# Patient Record
Sex: Female | Born: 1945 | Race: White | Hispanic: No | Marital: Married | Smoking: Never smoker
Health system: Southern US, Community
[De-identification: ages and names within clinical notes are randomized; demographics above are authoritative.]

## PROBLEM LIST (undated history)

## (undated) DIAGNOSIS — K7689 Other specified diseases of liver: Secondary | ICD-10-CM

## (undated) DIAGNOSIS — I872 Venous insufficiency (chronic) (peripheral): Secondary | ICD-10-CM

## (undated) DIAGNOSIS — Z860100 Personal history of colon polyps, unspecified: Secondary | ICD-10-CM

## (undated) DIAGNOSIS — E039 Hypothyroidism, unspecified: Secondary | ICD-10-CM

## (undated) DIAGNOSIS — I10 Essential (primary) hypertension: Secondary | ICD-10-CM

## (undated) DIAGNOSIS — M1990 Unspecified osteoarthritis, unspecified site: Secondary | ICD-10-CM

## (undated) DIAGNOSIS — M5030 Other cervical disc degeneration, unspecified cervical region: Secondary | ICD-10-CM

## (undated) DIAGNOSIS — N3941 Urge incontinence: Secondary | ICD-10-CM

## (undated) DIAGNOSIS — M51369 Other intervertebral disc degeneration, lumbar region without mention of lumbar back pain or lower extremity pain: Secondary | ICD-10-CM

## (undated) DIAGNOSIS — C44729 Squamous cell carcinoma of skin of left lower limb, including hip: Secondary | ICD-10-CM

## (undated) DIAGNOSIS — J189 Pneumonia, unspecified organism: Secondary | ICD-10-CM

## (undated) DIAGNOSIS — J309 Allergic rhinitis, unspecified: Secondary | ICD-10-CM

## (undated) DIAGNOSIS — M5136 Other intervertebral disc degeneration, lumbar region: Secondary | ICD-10-CM

## (undated) DIAGNOSIS — E669 Obesity, unspecified: Secondary | ICD-10-CM

## (undated) DIAGNOSIS — F419 Anxiety disorder, unspecified: Secondary | ICD-10-CM

## (undated) DIAGNOSIS — G2581 Restless legs syndrome: Secondary | ICD-10-CM

## (undated) DIAGNOSIS — M75101 Unspecified rotator cuff tear or rupture of right shoulder, not specified as traumatic: Secondary | ICD-10-CM

## (undated) DIAGNOSIS — E785 Hyperlipidemia, unspecified: Secondary | ICD-10-CM

## (undated) DIAGNOSIS — M940 Chondrocostal junction syndrome [Tietze]: Secondary | ICD-10-CM

## (undated) DIAGNOSIS — M12811 Other specific arthropathies, not elsewhere classified, right shoulder: Secondary | ICD-10-CM

## (undated) DIAGNOSIS — G4700 Insomnia, unspecified: Secondary | ICD-10-CM

## (undated) HISTORY — PX: OTHER SURGICAL HISTORY: SHX169

## (undated) HISTORY — PX: COLONOSCOPY: SHX174

## (undated) HISTORY — PX: TONSILLECTOMY: SUR1361

## (undated) HISTORY — PX: APPENDECTOMY: SHX54

## (undated) HISTORY — PX: WISDOM TOOTH EXTRACTION: SHX21

## (undated) HISTORY — PX: JOINT REPLACEMENT: SHX530

## (undated) HISTORY — PX: BREAST CYST EXCISION: SHX579

---

## 1998-07-19 ENCOUNTER — Other Ambulatory Visit: Admission: RE | Admit: 1998-07-19 | Discharge: 1998-07-19 | Payer: Self-pay | Admitting: Family Medicine

## 1999-07-18 ENCOUNTER — Encounter: Admission: RE | Admit: 1999-07-18 | Discharge: 1999-07-18 | Payer: Self-pay | Admitting: Family Medicine

## 1999-07-18 ENCOUNTER — Encounter: Payer: Self-pay | Admitting: Family Medicine

## 1999-08-08 ENCOUNTER — Other Ambulatory Visit: Admission: RE | Admit: 1999-08-08 | Discharge: 1999-08-08 | Payer: Self-pay | Admitting: Obstetrics and Gynecology

## 1999-08-23 ENCOUNTER — Encounter (INDEPENDENT_AMBULATORY_CARE_PROVIDER_SITE_OTHER): Payer: Self-pay

## 1999-08-23 ENCOUNTER — Other Ambulatory Visit: Admission: RE | Admit: 1999-08-23 | Discharge: 1999-08-23 | Payer: Self-pay | Admitting: Obstetrics and Gynecology

## 2000-07-20 ENCOUNTER — Encounter: Admission: RE | Admit: 2000-07-20 | Discharge: 2000-07-20 | Payer: Self-pay | Admitting: Obstetrics and Gynecology

## 2000-07-20 ENCOUNTER — Encounter: Payer: Self-pay | Admitting: Obstetrics and Gynecology

## 2000-08-17 ENCOUNTER — Other Ambulatory Visit: Admission: RE | Admit: 2000-08-17 | Discharge: 2000-08-17 | Payer: Self-pay | Admitting: Obstetrics and Gynecology

## 2000-09-15 ENCOUNTER — Encounter: Payer: Self-pay | Admitting: Orthopedic Surgery

## 2000-09-21 ENCOUNTER — Encounter: Payer: Self-pay | Admitting: Orthopedic Surgery

## 2000-09-21 ENCOUNTER — Inpatient Hospital Stay (HOSPITAL_COMMUNITY): Admission: RE | Admit: 2000-09-21 | Discharge: 2000-09-24 | Payer: Self-pay | Admitting: Orthopedic Surgery

## 2000-10-22 ENCOUNTER — Encounter: Admission: RE | Admit: 2000-10-22 | Discharge: 2000-11-30 | Payer: Self-pay | Admitting: Orthopedic Surgery

## 2000-11-12 ENCOUNTER — Encounter: Payer: Self-pay | Admitting: Specialist

## 2000-11-12 ENCOUNTER — Encounter: Payer: Self-pay | Admitting: Emergency Medicine

## 2000-11-12 ENCOUNTER — Observation Stay (HOSPITAL_COMMUNITY): Admission: EM | Admit: 2000-11-12 | Discharge: 2000-11-13 | Payer: Self-pay | Admitting: Emergency Medicine

## 2001-07-21 ENCOUNTER — Encounter: Payer: Self-pay | Admitting: Obstetrics and Gynecology

## 2001-07-21 ENCOUNTER — Encounter: Admission: RE | Admit: 2001-07-21 | Discharge: 2001-07-21 | Payer: Self-pay | Admitting: Obstetrics and Gynecology

## 2002-06-23 ENCOUNTER — Encounter: Admission: RE | Admit: 2002-06-23 | Discharge: 2002-06-23 | Payer: Self-pay | Admitting: Internal Medicine

## 2002-06-23 ENCOUNTER — Encounter: Payer: Self-pay | Admitting: Internal Medicine

## 2002-12-21 ENCOUNTER — Encounter: Admission: RE | Admit: 2002-12-21 | Discharge: 2002-12-21 | Payer: Self-pay | Admitting: Obstetrics and Gynecology

## 2003-06-26 ENCOUNTER — Ambulatory Visit (HOSPITAL_COMMUNITY): Admission: RE | Admit: 2003-06-26 | Discharge: 2003-06-26 | Payer: Self-pay | Admitting: Obstetrics and Gynecology

## 2003-06-28 ENCOUNTER — Encounter: Admission: RE | Admit: 2003-06-28 | Discharge: 2003-06-28 | Payer: Self-pay | Admitting: Obstetrics and Gynecology

## 2004-06-26 ENCOUNTER — Ambulatory Visit (HOSPITAL_COMMUNITY): Admission: RE | Admit: 2004-06-26 | Discharge: 2004-06-26 | Payer: Self-pay | Admitting: Obstetrics and Gynecology

## 2004-07-09 ENCOUNTER — Encounter: Admission: RE | Admit: 2004-07-09 | Discharge: 2004-07-09 | Payer: Self-pay | Admitting: Obstetrics and Gynecology

## 2005-06-27 ENCOUNTER — Encounter: Admission: RE | Admit: 2005-06-27 | Discharge: 2005-06-27 | Payer: Self-pay | Admitting: Internal Medicine

## 2005-09-05 ENCOUNTER — Other Ambulatory Visit: Admission: RE | Admit: 2005-09-05 | Discharge: 2005-09-05 | Payer: Self-pay | Admitting: Obstetrics and Gynecology

## 2006-02-17 HISTORY — PX: OTHER SURGICAL HISTORY: SHX169

## 2006-06-30 ENCOUNTER — Encounter: Admission: RE | Admit: 2006-06-30 | Discharge: 2006-06-30 | Payer: Self-pay | Admitting: Obstetrics and Gynecology

## 2007-07-02 ENCOUNTER — Encounter: Admission: RE | Admit: 2007-07-02 | Discharge: 2007-07-02 | Payer: Self-pay | Admitting: Internal Medicine

## 2007-09-15 ENCOUNTER — Inpatient Hospital Stay (HOSPITAL_COMMUNITY): Admission: RE | Admit: 2007-09-15 | Discharge: 2007-09-18 | Payer: Self-pay | Admitting: Orthopedic Surgery

## 2008-07-03 ENCOUNTER — Encounter: Admission: RE | Admit: 2008-07-03 | Discharge: 2008-07-03 | Payer: Self-pay | Admitting: Obstetrics and Gynecology

## 2009-02-17 HISTORY — PX: BLADDER SUSPENSION: SHX72

## 2009-07-06 ENCOUNTER — Encounter: Admission: RE | Admit: 2009-07-06 | Discharge: 2009-07-06 | Payer: Self-pay | Admitting: Obstetrics and Gynecology

## 2010-06-03 ENCOUNTER — Other Ambulatory Visit: Payer: Self-pay | Admitting: Internal Medicine

## 2010-06-03 DIAGNOSIS — Z1231 Encounter for screening mammogram for malignant neoplasm of breast: Secondary | ICD-10-CM

## 2010-07-02 NOTE — H&P (Signed)
NAMESTEVIE, [name]                ACCOUNT NO.:  [id number]   MEDICAL RECORD NO.:  [id number]          PATIENT TYPE:  INP   LOCATION:  1615                         FACILITY:  [facility]   PHYSICIAN:  Ollen Gross, M.D.    DATE OF BIRTH:  04-16-1945   DATE OF ADMISSION:  09/15/2007  DATE OF DISCHARGE:                              HISTORY & PHYSICAL   CHIEF COMPLAINT:  Right knee pain.   HISTORY OF PRESENT ILLNESS:  The patient is a 65 year old female who has  been seen by Dr. Lequita Halt for ongoing right knee pain.  She has known  arthritis.  She has been treated conservatively in the past utilizing  injections including a trial of Synvisc back in April  which did not  last very long.  Her x-rays showed significant tricompartmental  arthritis mostly involving the lateral aspect.  It is felt that she  would benefit from undergoing total knee replacement.  Risks and  benefits have been discussed.  She elected to proceed with surgery.   ALLERGIES:  NO KNOWN DRUG ALLERGIES.   CURRENT MEDICATIONS:  1. Femhrt hormone replacement.  2. Levothyroxine.  3. Lisinopril/HCTZ.  4. Darvocet.  5. Niatab.  6. Vitamin D.  7. One-A-Day Vitamin.  8. Flex-a-min.  9. Fish oil.  10.Flaxseed oil.  11.Red yeast rice with CoQ10.  12.Caltrate.  13.B100.   PAST MEDICAL HISTORY:  1. Hypertension.  2. Postmenopausal.  3. Hypothyroidism.   PAST SURGICAL HISTORY:  Hip replacement approximately 15 years ago  followed by revision hip replacement 7 years ago.   SOCIAL HISTORY:  Married, retired, nonsmoker.  No alcohol.  Family will  be assisting with care after surgery.   FAMILY HISTORY:  Father deceased with heart attack.  Mother old age.  Sister with heart and diabetes problems.  Son with heart disease.   REVIEW OF SYSTEMS:  GENERAL:  No fevers, chills, night sweats.  NEUROLOGICAL:  No seizures, syncope or paralysis.  RESPIRATORY:  No  shortness of breath, productive cough or hemoptysis.   CARDIOVASCULAR:  No chest pain, angina, orthopnea.  GI:  No nausea, vomiting, diarrhea,  constipation.  GU:  No dysuria, hematuria, discharge.  MUSCULOSKELETAL:  Right knee.   PHYSICAL EXAMINATION:  VITAL SIGNS:  Pulse 68, respirations 12, blood  pressure 122/68.  GENERAL:  A 59-year white female well-nourished, well-developed in no  acute distress, alert, oriented, and cooperative.  HEENT:  Normocephalic, atraumatic.  Pupils are round and reactive.  EOMs  intact.  Noted to wear glasses.  NECK:  Supple.  No bruits.  CHEST:  Clear.  HEART:  Regular rate and rhythm, no murmur.  ABDOMEN:  Soft, nontender.  Bowel sounds present.  RECTAL/BREASTS/GENITALIA:  Not done and not pertinent to present  illness.  EXTREMITIES:  Right knee range of motion 0-120.  No instability, pain on  lateral aspect.   IMPRESSION:  Osteoarthritis right knee.   PLAN:  The patient admitted to [facility] to undergo right  total knee replacement arthroplasty.  Surgery will be performed by Dr.  Ollen Gross.      Alexzandrew L.  Perkins, P.A.C.      Ollen Gross, M.D.  Electronically Signed    ALP/MEDQ  D:  09/16/2007  T:  09/16/2007  Job:  [id number]   cc:   Marlyne Beards, PA-C  [facility]

## 2010-07-02 NOTE — Op Note (Signed)
NAMEAVALEEN, BROWNLEY                ACCOUNT NO.:  [id number]   MEDICAL RECORD NO.:  [id number]          PATIENT TYPE:  INP   LOCATION:  0009                         FACILITY:  [facility]   PHYSICIAN:  Ollen Gross, M.D.    DATE OF BIRTH:  10/04/1945   DATE OF PROCEDURE:  09/15/2007  DATE OF DISCHARGE:                               OPERATIVE REPORT   PREOPERATIVE DIAGNOSIS:  Osteoarthritis, right knee.   POSTOPERATIVE DIAGNOSIS:  Osteoarthritis, right knee.   PROCEDURE:  Right total knee arthroplasty.   SURGEON:  Ollen Gross, M.D.   ASSISTANT:  Avel Peace, P.A.-C.   ANESTHESIA:  Spinal with Duramorph.   ESTIMATED BLOOD LOSS:  Minimal.   DRAINS:  None.   TOURNIQUET TIME:  26 minutes at 300 mmHg.   COMPLICATIONS:  None.   CONDITION:  Stable to recovery.   CLINICAL NOTE:  [name] is a 65 year old female who has end-stage  arthritis of the right knee with progressively worsening pain and  dysfunction.  She has failed nonoperative management and presents for  total knee arthroplasty.   PROCEDURE IN DETAIL:  After successful administration of spinal  anesthetic, a tourniquet was placed on the right thigh and right lower  extremity prepped and draped in the usual sterile fashion.  Extremity  was wrapped in Esmarch, knee flexed, tourniquet inflated to 300 mmHg.  Midline incision was made with a 10 blade through subcutaneous tissue to  level of the extensor mechanism.  A fresh blade was used make a medial  parapatellar arthrotomy.  Soft tissue over the proximal medial tibia  subperiosteally elevated to the joint line with the knife into the  semimembranosus bursa with a Cobb elevator.  Soft tissue laterally was  elevated with attention being paid to avoiding patellar tendon on tibial  tubercle.  Patella was subluxed laterally, knee flexed 90 degrees, ACL  and PCL removed.  Drill was used create a starting hole in the distal  femur, and the canal was thoroughly irrigated.  Five  degrees right  valgus alignment guide was placed, and referencing off the posterior  condyles, rotations marked and a block pinned to remove 10 mm off the  distal femur.  Distal femoral resection was made with an oscillating  saw.  Sizing blocks placed, size 3 was the most appropriate.  Rotations  marked off the epicondylar axis.  Size 3 cutting block was placed, and  the anterior, posterior and chamfer cuts made.   Tibia subluxed forward and the menisci were removed.  Extramedullary  tibial alignment guide was placed referencing proximally at the medial  aspect of the tibial tubercle and distally along the second metatarsal  axis and tibial crest.  Blocks pinned to remove about 10 mm from the  nondeficient lateral side.  Tibial resection was made with an  oscillating saw.  Size 2.5 was the most appropriate tibial component,  and the proximal tibia was prepared the modular drill and keel punch for  size 2.5.  Femoral preparation was completed with the intercondylar cut  for a size 3.   Size 2.5 mobile bearing tibial trial,  size 3 posterior stabilized  femoral trial and a 10-mm posterior stabilized rotating platform insert  trial were placed.  With a 10, she hyperextended a tiny bit and has a  little bit of varus-valgus, so we went with a 12.5 which allowed for  full extension with excellent varus, valgus, anterior and posterior  balance throughout full range of motion.  The patella was then everted  and thickness measured to be 22 mm.  Freehand resection was taken to 13  mm, 35 template was placed, lug holes were drilled, trial patella was  placed and it tracked normally.  Osteophytes were removed off the  posterior femur with the trial in place.  All trials were removed, and  the cut bone surfaces were prepared with pulsatile lavage.  Cement was  mixed, and once ready for implantation, a size 2.5 mobile bearing tibial  tray, a size 3 posterior stabilized femur and 35 patella were  cemented  into place.  The patella was held with a clamp.  Trial 12.5-mm insert  was placed, knee held in full extension and all extruded cement removed.  When the cement was fully hardened, then the wound was copiously  irrigated with saline solution.  The trials removed and the posterior  capsule injected FloSeal.  The permanent 12.5-mm posterior stabilized  rotating platform insert was placed into the tibial tray.  The FloSeal  injected into the medial and lateral gutters in the suprapatellar area.  Moist sponge was placed, and tourniquet was released for a total time of  26 minutes.  Sponge was held for 2 minutes and then removed.  Minimal  bleeding was encountered.  That which was encountered was stopped with  electrocautery.  Wound was further irrigated with saline solution.  The  arthrotomy closed with interrupted #1 PDS.  Flexion against gravity was  135 degrees.  Subcutaneous was closed with interrupted 2-0 Vicryl and  subcuticular running 4-0 Monocryl.  The incision was cleaned and dried  and Steri-Strips and a bulky sterile dressing applied.  She was then  placed into a knee immobilizer, awakened and transferred to recovery in  stable condition.      Ollen Gross, M.D.  Electronically Signed     FA/MEDQ  D:  09/15/2007  T:  09/15/2007  Job:  [id number]

## 2010-07-05 NOTE — Op Note (Signed)
[facility]  Patient:    [name], [name] [facility] Visit Number: [id number] MRN: [id number]          Service Type: SUR Location: 4W [id number] 02 Attending Physician:  Lubertha South Proc. Date: 11/12/00 Admit Date:  11/12/2000                             Operative Report  PREOPERATIVE DIAGNOSIS:  Left total hip arthroplasty dislocation, superior and  POSTOPERATIVE DIAGNOSIS:  Left total hip arthroplasty dislocation, superior and posterior.  PROCEDURE:  Closed manipulation and reduction of left dislocated total hip arthroplasty.  SURGEON:  Kerrin Champagne, M.D.  ASSISTANT:  None.  ANESTHESIA:  General via face mask, Dr. Gladis Riffle.  ESTIMATED BLOOD LOSS:  0 cc.  DRAINS:  None.  BRIEF CLINICAL HISTORY:  The patient is a 65 year old female, who sustained a dislocation of left total hip arthroplasty seven weeks postop, when she was trying on some slacks.  She was seen in the emergency room and evaluated.  She had a left superior-posterior dislocated total hip with uncemented components. She was brought to the operating room to undergo a closed manipulation and reduction under general anesthetic.  DESCRIPTION OF PROCEDURE:  After adequate face mask anesthesia and complete muscle relaxation, the left hip was flexed approximately 70 degrees and placed under traction with countertraction over both anterosuperior iliac spine, pressing the pelvis against the table.  With this palpable clunk was felt, this had the prosthesis reduced over the posterior lip of the prosthesis and into the acetabular component.  The leg was then brought out to length.  It had a nice smooth glide with extension and returned the foot to external rotation, and leg length slightly longer on the left compared to the right about 1 cm.  A single AP radiograph of the hip was obtained, demonstrating the reduced left total hip arthroplasty in good position alignment with no sign of loss of  components or loosening of components.  The patient was then reactivated from her anesthesia and returned to the recovery room in satisfactory condition following application of a left knee immobilizer and a pillow between the legs.  The patient is to be admitted to the hospital tonight to undergo therapy tomorrow and be discharged tomorrow postoperatively. Attending Physician:  Lubertha South DD:  11/12/00 TD:  11/12/00 Job: [id number]

## 2010-07-05 NOTE — Discharge Summary (Signed)
[facility]  Patient:    FATIME, BISWELL [facility]                     MRN: [id number] Adm. Date:  [id number] Disc. Date: [id number] Attending:  Loanne Drilling Dictator:   Della Goo, P.A.                           Discharge Summary  ADMISSION DIAGNOSES: 1. Left hip pain secondary to loose acetabular component. 2. Hypertension. 3. Hypothyroidism.  DISCHARGE DIAGNOSES: 1. 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 2. Hypertension. 3. Hypothyroidism. 4. Mild posthemorrhagic anemia.  PROCEDURE:  On 09/21/00, the patient underwent left acetabular and femoral head revision of total hip replacement, performed by Dr. Lequita Halt, assisted by Cherly Beach, P.A.C. under general anesthesia.  CONSULTATIONS:  None.  HISTORY OF PRESENT ILLNESS:  Ms. Chavana is a 65 year old white female status post left total hip replacement approximately 66 years prior to this admission.  She has done extremely well until the last six months when she has developed progressive discomfort and dysfunction of the left hip.  Radiographs show essential polyethylene wear and questionable loose acetabular component. It was felt she would benefit from a revision, and was admitted to the hospital for the procedure as stated above.  HOSPITAL COURSE:  The patient tolerated the procedure without difficulties. Postoperatively, neurovascular motor function was found to be intact in the lower extremities.  She was started on Coumadin for deep venous thrombosis and pulmonary embolism prophylaxis postoperatively, and was monitored by the pharmacy daily with adjustments in Coumadin made according to daily prothrombin times.  She was started on physical therapy program of ambulation and gait training, and was allowed touchdown weightbearing on the left lower extremity.  She was taught total hip replacement precautions, and was able to maintain these without difficulty.  Hemovac  drain was discontinued on the first postoperative day.  Dressing changes were done daily, and wound was healing well without signs of infection.  Postoperative hemoglobin dropped to 11.9, and prior to discharge was stable at 12.1.  The patient wore a knee immobilizer at bedtime only to prevent dislocation.  Her usual hormone replacement therapy was held while on Coumadin.  The patient utilized PCA analgesics initially for her discomfort and was weaned to p.o. analgesics without difficulty.  Occupational therapy assisted the patient with activities of daily living, and she tolerated these well.  On the third postoperative day she was felt stable for discharge to her home.  LABORATORY DATA:  Admission labs include a BMET, CBC, urinalysis, and coagulation studies all of which were within normal limits.  Hemoglobins ranged from 11.9 to 12.1 postoperatively as stated above.  INR at discharge was noted to be 1.7.  Chest x-ray on admission showed no acute abnormality. EKG on admission showed normal sinus rhythm with nonspecific ST and T wave abnormality, with no old tracings for comparison, confirmed by Dr. Eden Emms.  PLAN:  The patient is being discharged to her home.  Arrangements have been made for her to be followed by [facility] for home health physical therapy, as well as Coumadin management.  She will continue to be touchdown weightbearing on the left lower extremity, and will be maintained on total hip replacement precautions.  Coumadin will be continued for a three week period.  She will follow up with Dr. Lequita Halt in one week, and has been advised to call to arrange  the appointment.  She will have daily dressing changes at home, and has been given supplies to do so.  She will be allowed to shower at home.  The patient will call the office if she has questions or concerns prior to her return office visit. DD:  09/24/00 TD:  09/24/00 Job: [id number]

## 2010-07-05 NOTE — H&P (Signed)
[facility]  Patient:    [name], [name]                         MRN: [id number] Adm. Date:  09/21/00 Attending:  Ollen Gross, M.D. Dictator:   Carlean Jews, P.A.-C                         History and Physical  DATE OF BIRTH:  [birth year]  CHIEF COMPLAINT:  Left hip pain.  HISTORY OF PRESENT ILLNESS:  Patient is a 65 year old female who had a total hip replacement done in 1993 and had been doing extremely well until approximately eight months ago; at that point, she started to develop some groin pain with radiation down the medial side of her leg.  This occurs with any prolonged walking; it also occurs on the first few steps when she gets up. She has not had any systemic symptoms associated with this.  She did not have any pain prior to this development.  She has not had any trauma that led to this.  She notes that she has been limping a lot more over the past six to eight months.  The risks and benefits of revision of a total hip with a poly liner exchange versus acetabular revision were discussed with the patient by Dr. Ollen Gross and she decided to proceed with the surgery.  PAST MEDICAL HISTORY:  Significant for hypertension and hypothyroid.  PAST SURGICAL HISTORY:  Significant for a left total hip in 1993, toe surgery in 1992 and a benign lumpectomy in 1969 which was benign.  ALLERGIES:  There are no known drug allergies.  MEDICATIONS: 1. Prometrium 100 mg one tablet q.d. 2. Levothroid 150 mcg one tablet q.d. 3. Triamterene HCT 37.5 mg one q.d. 4. Estratest one q.d. 5. Various vitamin supplements including Caltrate, glucosamine and vitamin E,    Centrum Silver and vitamin B.  SOCIAL HISTORY:  Patient denies tobacco use, denies alcohol use.  She is married; she lives with her husband, who will be home to help her after surgery.  Ideally, she would like to be able to go home after initial hospital stay with home health physical therapy  and nursing.  PRIMARY MEDICAL Krista Som:  Suszanne Conners, P.A.C. out of [facility] in [place].  FAMILY MEDICAL HISTORY:  Mother is deceased at age 50 with a history of diabetes mellitus and breast cancer.  Father is deceased at age 17 with a history of coronary artery disease and MI.  REVIEW OF SYSTEMS:  GENERAL:  Patient denies fevers, chills, night sweats or bleeding tendencies.  CNS:  Patient denies blurred vision, double vision, seizure, headache or paralysis.  PULMONARY:  Denies shortness of breath, productive cough or hemoptysis.  CARDIAC:  Denies chest pain, angina, orthopnea and claudication.  GI:  Denies nausea, vomiting, constipation, diarrhea, melena or bloody stool.  GU:  Denies dysuria, hematuria or discharge.  MUSCULOSKELETAL:  As per HPI.  PHYSICAL EXAMINATION:  VITAL SIGNS:  Blood pressure is 132/82.  Respirations are 14 and unlabored. Pulse is 72 and regular.  GENERAL APPEARANCE:  Patient is a 65 year old female who is alert and oriented, in no acute distress, pleasant and cooperative to examination.  HEENT:  Normocephalic, atraumatic head.  Pupils are equal, round and reactive to light.  Extraocular movement is intact.  NECK:  There are no bruits and no thyromegaly noted and soft and supple to palpation.  CHEST:  Clear to auscultation bilaterally, anterior and posterior.  No rales, rhonchi, stridor, wheezes or friction rubs noted.  BREASTS:  Not pertinent and not performed.  HEART:  Normal S1 and S2.  Regular rate and rhythm.  No murmurs, gallops or rubs appreciated.  ABDOMEN:  Soft and supple to palpation.  Positive bowel sounds throughout. Nontender, nondistended.  No organomegaly noted.  GU:  Not pertinent and not performed.  EXTREMITIES:  Left lower extremity is approximately three-eighths of an inch shorter than the right lower extremity.  Her hip is able to be flexed to 100 and rotated 20 degrees, internal and external rotation, abduction of  about 30 degrees without discomfort.  She is minimally tender over her trochanter. She walks with an abductor lurch.  Pulses are intact distally and sensation is intact distally.  SKIN:  Intact without any rashes or lesions present.  X-RAY FINDINGS:  X-rays show a superolateral acetabular cyst consistent with a lytic cyst.  There is a halo around the periphery of the acetabular shell.  IMPRESSION: 1. Painful left hip, ? secondary to a loose acetabular component. 2. Hypertension. 3. Hypothyroidism.  PLAN:  Plan is to admit to [facility] on September 21, 2000 for a left total hip revision versus acetabular revision versus a poly liner exchange to be done by Dr. Ollen Gross. DD:  09/15/00 TD:  09/15/00 Job: [id number]

## 2010-07-05 NOTE — Op Note (Signed)
[facility]  Patient:    [name], [name] [facility]                     MRN: [id number] Proc. Date: 09/21/00 Adm. Date:  [id number] Attending:  Ollen Gross V                           Operative Report  PREOPERATIVE DIAGNOSIS:  Loose left acetabular component.  POSTOPERATIVE DIAGNOSIS:  Loose left acetabular component.  PROCEDURE:  Left acetabular and femoral head revision of total hip.  SURGEON:  Ollen Gross, M.D.  ASSISTANT:  Druscilla Brownie. Underwood III, P.A.-C.  ANESTHESIA:  General.  ESTIMATED BLOOD LOSS:  200.  DRAINS:  Hemovac x 1.  COMPLICATIONS:  None.  CONDITION:  Stable to recovery.  BRIEF CLINICAL NOTE:  Ms. Pollok is a 65 year old female, who had a left total hip done approximately 10 years ago by Dr. Fannie Knee.  She had excellent result until the past six months, when she has developed progressively increasing groin pain.  Radiographs show some essential polyethylene wear and questionable loose acetabular component.  She presents now for revision.  PROCEDURE IN DETAIL:  After the successful administered of general anesthetic, the patient was placed in the right lateral decubitus position with the left side up and held with the hip positioner.  The left lower extremity was isolated from her perineum with plastic drapes and prepped and draped in the usual sterile fashion.  Standard posterolateral incision is made, skin cut with 10 blade, subcutaneous tissue to the level of the fascia lata which is incised in line with the skin incision.  Soft tissue sleeve posteriorly is elevated off the femur and sciatic nerve palpated and protected. Circumferential exposure of the acetabulum is obtained.  Hip is dislocated and femoral head removed.  The femur is translated anteriorly.  The cup was noted to be grossly loose.  The cup is easily removed with the polyethylene intact. She had no significant bony defect.  Reaming is then performed, starting at a size  48, which was the size of the cup removed, up to a size 52.  I took some of the cancellous bone from the reamings and packed a superior acetabular cyst with those reamings.  The cyst cavity was curetted and cleaned.  The bone graft was then placed.  A 52 mm PSL microstructured cup is impacted into the acetabulum in approximately 40 degrees of abduction, 20 degrees of forward flexion.  This was transfixed with two dome screws.  Trial 28 mm 10 degree liner is placed with a 28 mm plus 5 head.  Hip is reduced with excellent stability throughout, full extension, full external rotation, 70 degrees flexion, 40 degrees adduction, 90 degrees internal rotation, 90 degrees flexion, 90 degrees internal rotation.  Trials are removed.  Permanent 28 mm 10 degree Crossfire liner is impacted to the acetabular shell, and a 28 plus 5 femoral head impacted onto the femoral neck.  The femoral component was stable.  Hip is reduced, wound copiously irrigated with antibiotic solution and soft tissue sleeve posteriorly reattached to the femur through drill holes.  Fascia lata is closed over the Hemovac drain with interrupted #1 Vicryl.  Subcutaneous closed in three layers with interrupted #1 and 2-0 Vicryl and skin closed with staples.  Drain is hooked to suction, clean incision, clean and dry, and a bulky sterile dressing applied.  The patient was then awakened and transported to recovery in  stable condition. DD:  09/21/00 TD:  09/21/00 Job: [id number]

## 2010-07-05 NOTE — Discharge Summary (Signed)
NAMETASHI, ANDUJO                ACCOUNT NO.:  [id number]   MEDICAL RECORD NO.:  [id number]          PATIENT TYPE:  INP   LOCATION:  1615                         FACILITY:  [facility]   PHYSICIAN:  Ollen Gross, M.D.    DATE OF BIRTH:  January 15, 1946   DATE OF ADMISSION:  09/15/2007  DATE OF DISCHARGE:  09/18/2007                               DISCHARGE SUMMARY   ADMITTING DIAGNOSES:  1. Osteoarthritis, right knee.  2. Hypertension.  3. Postmenopausal.  4. Hypothyroidism.   DISCHARGE DIAGNOSES:  1. Osteoarthritis right knee, status post right total knee replacement      arthroplasty.  2. Postop hyponatremia, stable.  3. Postop hypokalemia, improved.  4. Hypertension.  5. Postmenopausal.  6. Hypothyroidism.   PROCEDURE:  September 15, 2007, right total knee surgery, Dr. Lequita Halt,  assistant Avel Peace PA-C.  Spinal with Duramorph.   CONSULTS:  None.   BRIEF HISTORY:  Ms. [name] is a 65 year old female with history of  arthritis of the right knee, progressive worsening pain and dysfunction,  failed conservative management, now presents for total knee  arthroplasty.   LABORATORY DATA:  Preop CBC showed hemoglobin 13.3, hematocrit 39.1,  platelets 265, white count 6.4. Postop hemoglobin down to 11.4 then  drifted down to 11, last H&H 10.5 and 30.3.  PT/PTT preop 13.2 and 25  respectively.  INR 1.0. Serial Protimes were followed per Coumadin  protocol.  Last noted PT/INR 27.1 and 2.3.  Chem panel on admission all  within normal limits.  Serial BMETs were followed.  Sodium did drop 140-  138, got  down to 132. Last noted at 132. Potassium dropped from 3.7 to  3.2, back up to 3.6.  Preop UA trace leukocyte esterase zero to 2 white  cells, rare epithelials, blood type A+.   Chest x-ray, August 26, 2007.  Two-view chest, normal.  Two-view chest  radiograph.  EKG, August 26, 2007, normal sinus rhythm.   HOSPITAL COURSE:  The patient was admitted to [facility] and  tolerated the  procedure well. Later transferred to the recovery room,  orthopedic floor, started on PCA and p.o. analgesics, given 24 hours  postop IV antibiotics.  Started on Coumadin for DVT prophylaxis.  Actually did very well the evening of the surgery and the morning of day  one. Had pain under good control, started back on her home medications,  levothyroxine, also her blood pressure meds.  Started getting up out of  bed.  Actually walked short distances on day one, but walked about 75  feet by day two, had better control.  DC the fluids.  The IV came out.  Dressing change, incision looked good, had good output.  Potassium was a  little low, she was put on potassium supplements.  Continued to progress  well.  By day three she was up ambulating well, tolerating her meds and  discharged home.   DISCHARGE PLAN:  1. The patient was discharged home on September 18, 2007.  2. Discharge diagnoses, please see above.  3. Discharge meds:  Percocet, Robaxin, Coumadin.  4. Diet, heart-healthy diet.  5. Activity, weightbearing as tolerated, right knee, total knee      protocol.  6. Home health PT, home health nursing.  7. Follow up 2 weeks.   DISPOSITION:  Home.   CONDITION ON DISCHARGE:  Improving.      Alexzandrew L. Perkins, P.A.C.      Ollen Gross, M.D.  Electronically Signed    ALP/MEDQ  D:  11/04/2007  T:  11/06/2007  Job:  [id number]   cc:   Rockney Ghee, [facility]  [facility]  [address]  [place], [place] [address]

## 2010-07-05 NOTE — H&P (Signed)
[facility]  Patient:    [name], [name] [facility] Visit Number: [id number] MRN: [id number]          Service Type: EMS Location: ED Attending Physician:  Osvaldo Human Dictated by:   Dorie Rank, P.A. Admit Date:  11/12/2000                           History and Physical  CHIEF COMPLAINT:  Left hip pain.  HISTORY OF PRESENT ILLNESS:  The patient is a pleasant 65 year old female who underwent a left total hip arthroplasty revision approximately seven weeks ago by Dr. Ollen Gross.  She was progressing well until today when she tried some new exercises at physical therapy.  She was trying on clothes after her appointment there and bent over and had severe pain to her left hip.  She was transported to [facility] ER where x-rays revealed a dislocated left total hip arthroplasty.  Original left total hip arthroplasty was nine years ago.  PAST MEDICAL HISTORY:  Significant for osteoarthritis.  She has a history of hypertension that is well-controlled on her current medications.  She has a history of hypothyroidism.  DRUG ALLERGIES:  No known drug allergies.  MEDICATIONS:  Celebrex, Synthroid, triamterene, unsure of the doses.  Today, husband will bring her medications to the hospital for verification.  SOCIAL HISTORY:  Denies any alcohol or tobacco use.  She is an Print production planner. She is married.  SURGICAL HISTORY:  Left total hip arthroplasty x 9 years ago.  Toe surgery in the distant past.  Left total hip arthroplasty revision x 7 weeks ago.  FAMILY HISTORY:  Noncontributory.  REVIEW OF SYSTEMS:  GENERAL:  No fevers, chills, night sweats or bleeding tendencies.  CARDIOVASCULAR:  No chest pain, angina or orthopnea.  PULMONARY: No shortness of breath, productive cough or hemoptysis.  ENDOCRINE:  Negative for diabetes mellitus.  She has a history of hypothyroidism.  GI:  No melena, constipation, diarrhea, nausea or vomiting.  GU:  No hematuria, dysuria  or discharge.  NEUROLOGIC:  No seizures, headaches or paralysis.  No diplopia or blurred vision.  PHYSICAL EXAMINATION:  GENERAL:  Alert and oriented 65 year old white female accompanied by her husband today.  VITALS:  Pulse 85, respirations 22, blood pressure 162/94.  NECK:  Supple.  Negative for carotid bruits bilaterally.  HEENT:  Head is atraumatic, normocephalic.  Oropharynx is clear.  Halitosis.  CHEST:  Lungs are clear to auscultation bilaterally.  No wheezes, rhonchi or rales.  HEART:  Regular in rate and rhythm.  No murmur, rub or gallop.  ABDOMEN:  Soft, round, nontender.  Positive bowel sounds.  GENITOURINARY:  Not pertinent to present illness.  EXTREMITIES:  She has pain on any type of palpation or movement to the left hip.  Dorsalis pedis pulses 2+ and symmetrical.  Posterior tibialis pulses 2+ and symmetrical.  SKIN:  Acyanotic.  No rashes or lesions appreciated on exam.  Patient does have a scab to her right naris.  NEUROLOGIC:  EOMs intact.  PERRLA.  X-RAY FINDINGS:  Preop x-rays reveal a left total hip arthroplasty dislocation.  Chest x-ray shows no active disease.  LABORATORY DATA:  EKG:  Normal sinus rhythm.  Labs are pending.  IMPRESSION: 1. Left total hip arthroplasty dislocation. 2. Hypothyroidism. 3. Hypertension.    PLAN:  Patient is scheduled for a closed manipulation of the left total hip arthroplasty.  Dictated by:   Dorie Rank, P.A. Attending Physician:  Osvaldo Human DD:  11/12/00 TD:  11/12/00 Job: [id number]

## 2010-07-11 ENCOUNTER — Ambulatory Visit
Admission: RE | Admit: 2010-07-11 | Discharge: 2010-07-11 | Disposition: A | Discharge status: Home / Self Care | Payer: Medicare Other | Source: Ambulatory Visit | Attending: *Deleted | Admitting: *Deleted

## 2010-07-11 DIAGNOSIS — Z1231 Encounter for screening mammogram for malignant neoplasm of breast: Secondary | ICD-10-CM

## 2010-11-15 LAB — COMPREHENSIVE METABOLIC PANEL
ALT: 22
AST: 25
BUN: 11
CO2: 28
Calcium: 9.2
Chloride: 102
Creatinine, Ser: 0.83
GFR calc non Af Amer: >60

## 2010-11-15 LAB — CBC
HCT: 32 — ABNORMAL LOW
HCT: 30.3 — ABNORMAL LOW
Hemoglobin: 11.4 — ABNORMAL LOW
Hemoglobin: 13.3
MCHC: 34.4
MCV: 90.4
MCV: 89.9
Platelets: 187
RBC: 3.56 — ABNORMAL LOW
RBC: 3.67 — ABNORMAL LOW
RDW: 12.9
RDW: 13.5
RDW: 13.7
RDW: 13.2

## 2010-11-15 LAB — BASIC METABOLIC PANEL
BUN: 6
CO2: 24
CO2: 27
Calcium: 8.2 — ABNORMAL LOW
Calcium: 8.5
Chloride: 107
Chloride: 97
Creatinine, Ser: 0.6
GFR calc Af Amer: >60
GFR calc Af Amer: >60
GFR calc non Af Amer: >60
Glucose, Bld: 140 — ABNORMAL HIGH
Glucose, Bld: 133 — ABNORMAL HIGH
Potassium: 3.7
Potassium: 3.6
Sodium: 138

## 2010-11-15 LAB — URINALYSIS, ROUTINE W REFLEX MICROSCOPIC
Bilirubin Urine: NEGATIVE
Glucose, UA: NEGATIVE
Hgb urine dipstick: NEGATIVE
Ketones, ur: NEGATIVE
Protein, ur: NEGATIVE

## 2010-11-15 LAB — PROTIME-INR
Prothrombin Time: 13.2
Prothrombin Time: 19.1 — ABNORMAL HIGH
Prothrombin Time: 27.1 — ABNORMAL HIGH

## 2010-11-15 LAB — URINE MICROSCOPIC-ADD ON

## 2011-06-23 ENCOUNTER — Other Ambulatory Visit: Payer: Self-pay | Admitting: Internal Medicine

## 2011-06-23 DIAGNOSIS — Z1231 Encounter for screening mammogram for malignant neoplasm of breast: Secondary | ICD-10-CM

## 2011-07-21 ENCOUNTER — Ambulatory Visit
Admission: RE | Admit: 2011-07-21 | Discharge: 2011-07-21 | Disposition: A | Discharge status: Home / Self Care | Payer: Medicare Other | Source: Ambulatory Visit | Attending: Internal Medicine | Admitting: Internal Medicine

## 2011-07-21 DIAGNOSIS — Z1231 Encounter for screening mammogram for malignant neoplasm of breast: Secondary | ICD-10-CM

## 2011-07-24 ENCOUNTER — Other Ambulatory Visit: Payer: Self-pay | Admitting: Internal Medicine

## 2011-07-24 DIAGNOSIS — R928 Other abnormal and inconclusive findings on diagnostic imaging of breast: Secondary | ICD-10-CM

## 2011-08-01 ENCOUNTER — Ambulatory Visit
Admission: RE | Admit: 2011-08-01 | Discharge: 2011-08-01 | Disposition: A | Discharge status: Home / Self Care | Payer: Medicare Other | Source: Ambulatory Visit | Attending: Internal Medicine | Admitting: Internal Medicine

## 2011-08-01 DIAGNOSIS — R928 Other abnormal and inconclusive findings on diagnostic imaging of breast: Secondary | ICD-10-CM

## 2011-09-01 ENCOUNTER — Encounter (HOSPITAL_COMMUNITY): Payer: Self-pay | Admitting: Pharmacy Technician

## 2011-09-02 ENCOUNTER — Other Ambulatory Visit: Payer: Self-pay | Admitting: Orthopedic Surgery

## 2011-09-02 MED ORDER — BUPIVACAINE LIPOSOME 1.3 % IJ SUSP: 20.0000 mL | Freq: Once | INTRAMUSCULAR | Status: DC

## 2011-09-02 MED ORDER — DEXAMETHASONE SODIUM PHOSPHATE 10 MG/ML IJ SOLN: 10.0000 mg | Freq: Once | INTRAMUSCULAR | Status: DC

## 2011-09-02 NOTE — Progress Notes (Signed)
Preoperative surgical orders have been place into the Epic hospital system for [name] on 09/02/2011, 10:14 AM  by Patrica Duel for surgery on 09/08/2011.  Preop Total Hip orders including Experel Injecion, IV Tylenol, and IV Decadron as long as there are no contraindications to the above medications. Avel Peace, PA-C

## 2011-09-03 ENCOUNTER — Encounter (HOSPITAL_COMMUNITY): Payer: Self-pay

## 2011-09-03 ENCOUNTER — Ambulatory Visit (HOSPITAL_COMMUNITY)
Admission: RE | Admit: 2011-09-03 | Discharge: 2011-09-03 | Disposition: A | Discharge status: Home / Self Care | Payer: Medicare Other | Source: Ambulatory Visit | Attending: Orthopedic Surgery | Admitting: Orthopedic Surgery

## 2011-09-03 ENCOUNTER — Ambulatory Visit (HOSPITAL_COMMUNITY): Admission: RE | Admit: 2011-09-03 | Payer: Medicare Other | Source: Ambulatory Visit

## 2011-09-03 ENCOUNTER — Encounter (HOSPITAL_COMMUNITY)
Admission: RE | Admit: 2011-09-03 | Discharge: 2011-09-03 | Disposition: A | Discharge status: Home / Self Care | Payer: Medicare Other | Source: Ambulatory Visit | Attending: Orthopedic Surgery | Admitting: Orthopedic Surgery

## 2011-09-03 DIAGNOSIS — Z01812 Encounter for preprocedural laboratory examination: Secondary | ICD-10-CM | POA: Insufficient documentation

## 2011-09-03 DIAGNOSIS — I10 Essential (primary) hypertension: Secondary | ICD-10-CM | POA: Insufficient documentation

## 2011-09-03 DIAGNOSIS — M169 Osteoarthritis of hip, unspecified: Secondary | ICD-10-CM | POA: Insufficient documentation

## 2011-09-03 DIAGNOSIS — M1610 Unilateral primary osteoarthritis, unspecified hip: Secondary | ICD-10-CM | POA: Insufficient documentation

## 2011-09-03 DIAGNOSIS — Z96649 Presence of unspecified artificial hip joint: Secondary | ICD-10-CM | POA: Insufficient documentation

## 2011-09-03 DIAGNOSIS — Z01818 Encounter for other preprocedural examination: Secondary | ICD-10-CM | POA: Insufficient documentation

## 2011-09-03 HISTORY — DX: Hypothyroidism, unspecified: E03.9

## 2011-09-03 HISTORY — DX: Essential (primary) hypertension: I10

## 2011-09-03 LAB — SURGICAL PCR SCREEN
MRSA, PCR: NEGATIVE
Staphylococcus aureus: NEGATIVE

## 2011-09-03 NOTE — Patient Instructions (Addendum)
20 [name]  09/03/2011   Your procedure is scheduled on:  09-08-2011  Report to [facility] at  1145 AM.  Call this number if you have problems the morning of surgery: [phone]   Remember:   Do not eat food:After Midnight.  .clear liquids midnight until 810 am, then nothing by mouth  Take these medicines the morning of surgery with A SIP OF WATER: levothryoxine   Do not wear jewelry or make up.  Do not wear lotions, powders, or perfumes.Do not wear deodorant.    Do not bring valuables to the hospital.  Contacts, dentures or bridgework may not be worn into surgery.  Leave suitcase in the car. After surgery it may be brought to your room.  For patients admitted to the hospital, checkout time is 11:00 AM the day of     discharge                             Special Instructions: CHG Shower Use Special Wash: 1/2 bottle night before surgery and 1/2 bottle morning of surgery, use regular soap on face and front and back private area. No shaving for 2 days before showers.   Please read over the following fact sheets that you were given: MRSA Information, blood fact sheet, incentive spirometer fact sheeet  Cain Sieve [facility] pre op nurse phone number [phone], call if needed

## 2011-09-07 ENCOUNTER — Other Ambulatory Visit: Payer: Self-pay | Admitting: Orthopedic Surgery

## 2011-09-07 NOTE — H&P (Signed)
[name]  DOB: 04/22/1945 Married / Language: English / Race: White / Female  Date of Admission:  09/08/2011  Chief Complaint:  Right Hip Pain  History of Present Illness The patient is a 65 year old female who comes in for a preoperative History and Physical. The patient is scheduled for a right total hip arthroplasty to be performed by Dr. Frank V. Aluisio, MD at [facility] on 09/08/2011. The patient is a 65 year old female who presents for follow up of their hip. The patient is being followed for their right osteoarthritis. The patient has not gotten any relief of their symptoms with Cortisone injections (intra articular with Ramos). The last IA injection only helped for 3 weeks. She had this injection at the end of May. She is having increased difficulty with walking and with lifting the right leg. She was hopeful that the injection would work as well as the first one that she received in Feb which lasted 3 months. She does get relief with Tramadol. She is taking up to 6 per day (every 4 hours). She is ready to get the hip replaced because it is limiting her quality of life. Unfortunately her hip is getting progressively worse. This right hip is worse than the left one was prior to when she had that replacement years ago. It all started about six or eight months ago and now has led to persistent constant pain. She is having a hard time even sleeping. Pain is in the groin, her buttock radiating towards her knee sometimes going down to her foot. She is not having lower extremity weakness or paresthesia with this. They have been treated conservatively in the past for the above stated problem and despite conservative measures, they continue to have progressive pain and severe functional limitations and dysfunction. They have failed non-operative management including home exercise, medications, and injections. It is felt that they would benefit from undergoing total joint  replacement. Risks and benefits of the procedure have been discussed with the patient and they elect to proceed with surgery. There are no active contraindications to surgery such as ongoing infection or rapidly progressive neurological disease.  Allergies: NKDA   Problem List/Past Medical Osteoarthritis, Hip (715.35) Knee Pain (719.46) Lumbago (724.2). 10/15/2009 High blood pressure Hypothyroidism Mech cmpl intrn orth dev/implant/graft  Shingles Hypercholesterolemia Urinary Incontinence Measles Mumps Rubella Menopause  Family History Heart Disease. mother and father Hypertension. mother and father Diabetes Mellitus. mother Father. Deceased, Heart disease. age 50 Mother. Deceased, Heart disease, Diabetes mellitus, Type II. age 86   Social History Living situation. live with spouse Marital status. married Number of flights of stairs before winded. greater than 5 Drug/Alcohol Rehab (Previously). no Exercise. Exercises weekly; does team sport Illicit drug use. no Pain Contract. no Tobacco / smoke exposure. no Tobacco use. never smoker Current work status. retired Drug/Alcohol Rehab (Currently). no Children. 3 Alcohol use. never consumed alcohol Post-Surgical Plans. Plan is to go home. Advance Directives. Living Will, Healthcare POA   Medication History TraMADol HCl (50MG Tablet, 1-2 Tablet Oral every 6 hours as needed for pain, Taken starting 09/01/2011) Active. Lisinopril ( Oral) Specific dose unknown - Active. Levothyroxine Sodium ( Oral) Specific dose unknown - Active. Estratest HS ( Oral) Active. progesterone Active. (oral tablet)   Past Surgical History Total Hip Replacement. left Total Knee Replacement. Right   Review of Systems General:Not Present- Chills, Fever, Night Sweats, Fatigue, Weight Gain, Weight Loss and Memory Loss. Skin:Not Present- Hives, Itching, Rash, Eczema and   Lesions. HEENT:Not Present- Tinnitus,  Headache, Double Vision, Visual Loss, Hearing Loss and Dentures. Respiratory:Not Present- Shortness of breath with exertion, Shortness of breath at rest, Allergies, Coughing up blood and Chronic Cough. Cardiovascular:Not Present- Chest Pain, Racing/skipping heartbeats, Difficulty Breathing Lying Down, Murmur, Swelling and Palpitations. Gastrointestinal:Not Present- Bloody Stool, Heartburn, Abdominal Pain, Vomiting, Nausea, Constipation, Diarrhea, Difficulty Swallowing, Jaundice and Loss of appetitie. Female Genitourinary:Not Present- Blood in Urine, Urinary frequency, Weak urinary stream, Discharge, Flank Pain, Incontinence, Painful Urination, Urgency, Urinary Retention and Urinating at Night. Musculoskeletal:Not Present- Muscle Weakness, Muscle Pain, Joint Swelling, Joint Pain, Back Pain, Morning Stiffness and Spasms. Neurological:Not Present- Tremor, Dizziness, Blackout spells, Paralysis, Difficulty with balance and Weakness. Psychiatric:Not Present- Insomnia.   Vitals Weight: 183 lb Height: 64 in Body Surface Area: 1.94 m Body Mass Index: 31.41 kg/m Pulse: 80 (Regular) Resp.: 14 (Unlabored) BP: 136/78 (Sitting, Left Arm, Standard)   Physical Exam The physical exam findings are as follows:  Patient is a 65 year old female with continued hip pain.   General Mental Status - Alert, cooperative and good historian. General Appearance- pleasant. Not in acute distress. Orientation- Oriented X3. Build & Nutrition- Well nourished and Well developed.   Head and Neck Head- normocephalic, atraumatic . Neck Global Assessment- supple. no bruit auscultated on the right and no bruit auscultated on the left.   Eye Pupil- Bilateral- Regular and Round. Note: wears glasses Motion- Bilateral- EOMI.   Chest and Lung Exam Auscultation: Breath sounds:- clear at anterior chest wall and - clear at posterior chest wall. Adventitious sounds:- No Adventitious  sounds.   Cardiovascular Auscultation:Rhythm- Regular rate and rhythm. Heart Sounds- S1 WNL and S2 WNL. Murmurs & Other Heart Sounds:Auscultation of the heart reveals - No Murmurs.   Abdomen Palpation/Percussion:Tenderness- Abdomen is non-tender to palpation. Rigidity (guarding)- Abdomen is soft. Auscultation:Auscultation of the abdomen reveals - Bowel sounds normal.   Female Genitourinary Not done, not pertinent to present illness  Musculoskeletal On exam she is alert and oriented in no apparent distress. Her right hip can be flexed about 90 with no internal rotation, about 20 external rotation, 20 abduction.  RADIOGRAPHS: Radiographs show that she has bone on bone arthritis in the right hip. She had a normal joint space two years ago and had minimal narrowing about six months ago.  Assessment & Plan Osteoarthritis Right Hip  Note: Patient is for a Right Total Hip Replacement by Dr. Aluisio.  Plan is to go home.  PCP - Dr. Daniel Jobe  Signed electronically by DREW L Ulyses Panico, PA-C  

## 2011-09-08 ENCOUNTER — Ambulatory Visit (HOSPITAL_COMMUNITY): Payer: Medicare Other | Admitting: Registered Nurse

## 2011-09-08 ENCOUNTER — Ambulatory Visit (HOSPITAL_COMMUNITY): Payer: Medicare Other

## 2011-09-08 ENCOUNTER — Encounter (HOSPITAL_COMMUNITY): Payer: Self-pay | Admitting: *Deleted

## 2011-09-08 ENCOUNTER — Encounter (HOSPITAL_COMMUNITY): Payer: Self-pay | Admitting: Registered Nurse

## 2011-09-08 ENCOUNTER — Encounter (HOSPITAL_COMMUNITY)
Admission: RE | Disposition: A | Discharge status: Home Health | Payer: Self-pay | Source: Ambulatory Visit | Attending: Orthopedic Surgery

## 2011-09-08 ENCOUNTER — Inpatient Hospital Stay (HOSPITAL_COMMUNITY)
Admission: RE | Admit: 2011-09-08 | Discharge: 2011-09-11 | DRG: 470 | Disposition: A | Discharge status: Home Health | Payer: Medicare Other | Source: Ambulatory Visit | Attending: Orthopedic Surgery | Admitting: Orthopedic Surgery

## 2011-09-08 DIAGNOSIS — M169 Osteoarthritis of hip, unspecified: Secondary | ICD-10-CM | POA: Diagnosis present

## 2011-09-08 DIAGNOSIS — E871 Hypo-osmolality and hyponatremia: Secondary | ICD-10-CM | POA: Diagnosis not present

## 2011-09-08 DIAGNOSIS — Z96649 Presence of unspecified artificial hip joint: Secondary | ICD-10-CM

## 2011-09-08 DIAGNOSIS — I10 Essential (primary) hypertension: Secondary | ICD-10-CM | POA: Diagnosis present

## 2011-09-08 DIAGNOSIS — D62 Acute posthemorrhagic anemia: Secondary | ICD-10-CM | POA: Diagnosis not present

## 2011-09-08 DIAGNOSIS — M1610 Unilateral primary osteoarthritis, unspecified hip: Principal | ICD-10-CM | POA: Diagnosis present

## 2011-09-08 DIAGNOSIS — E039 Hypothyroidism, unspecified: Secondary | ICD-10-CM | POA: Diagnosis present

## 2011-09-08 HISTORY — PX: TOTAL HIP ARTHROPLASTY: SHX124

## 2011-09-08 LAB — TYPE AND SCREEN
ABO/RH(D): A POS
Antibody Screen: NEGATIVE

## 2011-09-08 SURGERY — ARTHROPLASTY, HIP, TOTAL,POSTERIOR APPROACH
Anesthesia: General | Site: Hip | Laterality: Right | Wound class: Clean

## 2011-09-08 MED ORDER — ONDANSETRON HCL 4 MG/2ML IJ SOLN: 4.0000 mg | Freq: Four times a day (QID) | INTRAMUSCULAR | Status: DC | PRN

## 2011-09-08 MED ORDER — POTASSIUM CHLORIDE IN NACL 20-0.9 MEQ/L-% IV SOLN
INTRAVENOUS | Status: DC
  Administered 2011-09-08: 20:00:00 via INTRAVENOUS
  Filled 2011-09-08 (×3): qty 1000

## 2011-09-08 MED ORDER — METOCLOPRAMIDE HCL 10 MG PO TABS: 5.0000 mg | ORAL_TABLET | Freq: Three times a day (TID) | ORAL | Status: DC | PRN

## 2011-09-08 MED ORDER — NEOSTIGMINE METHYLSULFATE 1 MG/ML IJ SOLN
INTRAMUSCULAR | Status: DC | PRN
  Administered 2011-09-08: 4 mg via INTRAVENOUS

## 2011-09-08 MED ORDER — HYDROMORPHONE HCL PF 1 MG/ML IJ SOLN
0.2500 mg | INTRAMUSCULAR | Status: DC | PRN
  Administered 2011-09-08 (×4): 0.5 mg via INTRAVENOUS

## 2011-09-08 MED ORDER — LEVOTHYROXINE SODIUM 137 MCG PO TABS
137.0000 ug | ORAL_TABLET | Freq: Every day | ORAL | Status: DC
  Administered 2011-09-09 – 2011-09-11 (×3): 137 ug via ORAL
  Filled 2011-09-08 (×4): qty 1

## 2011-09-08 MED ORDER — ONDANSETRON HCL 4 MG/2ML IJ SOLN
INTRAMUSCULAR | Status: DC | PRN
  Administered 2011-09-08: 4 mg via INTRAVENOUS

## 2011-09-08 MED ORDER — FLEET ENEMA 7-19 GM/118ML RE ENEM: 1.0000 | ENEMA | Freq: Once | RECTAL | Status: AC | PRN

## 2011-09-08 MED ORDER — PROMETHAZINE HCL 25 MG/ML IJ SOLN: 6.2500 mg | INTRAMUSCULAR | Status: DC | PRN

## 2011-09-08 MED ORDER — ACETAMINOPHEN 325 MG PO TABS
650.0000 mg | ORAL_TABLET | Freq: Four times a day (QID) | ORAL | Status: DC | PRN
  Administered 2011-09-09 – 2011-09-11 (×3): 650 mg via ORAL
  Filled 2011-09-08 (×3): qty 2

## 2011-09-08 MED ORDER — ROCURONIUM BROMIDE 100 MG/10ML IV SOLN
INTRAVENOUS | Status: DC | PRN
  Administered 2011-09-08: 40 mg via INTRAVENOUS

## 2011-09-08 MED ORDER — CEFAZOLIN SODIUM-DEXTROSE 2-3 GM-% IV SOLR
INTRAVENOUS | Status: AC
  Filled 2011-09-08: qty 50

## 2011-09-08 MED ORDER — PHENOL 1.4 % MT LIQD: 1.0000 | OROMUCOSAL | Status: DC | PRN

## 2011-09-08 MED ORDER — METHOCARBAMOL 100 MG/ML IJ SOLN
500.0000 mg | Freq: Four times a day (QID) | INTRAVENOUS | Status: DC | PRN
  Administered 2011-09-08 – 2011-09-09 (×2): 500 mg via INTRAVENOUS
  Filled 2011-09-08 (×3): qty 5

## 2011-09-08 MED ORDER — CEFAZOLIN SODIUM 1-5 GM-% IV SOLN
1.0000 g | Freq: Four times a day (QID) | INTRAVENOUS | Status: AC
  Administered 2011-09-08 – 2011-09-09 (×2): 1 g via INTRAVENOUS
  Filled 2011-09-08 (×2): qty 50

## 2011-09-08 MED ORDER — FENTANYL CITRATE 0.05 MG/ML IJ SOLN
INTRAMUSCULAR | Status: DC | PRN
  Administered 2011-09-08 (×3): 50 ug via INTRAVENOUS
  Administered 2011-09-08: 100 ug via INTRAVENOUS

## 2011-09-08 MED ORDER — DIPHENHYDRAMINE HCL 12.5 MG/5ML PO ELIX
12.5000 mg | ORAL_SOLUTION | ORAL | Status: DC | PRN
  Administered 2011-09-09: 12.5 mg via ORAL
  Filled 2011-09-08: qty 5

## 2011-09-08 MED ORDER — MIDAZOLAM HCL 5 MG/5ML IJ SOLN
INTRAMUSCULAR | Status: DC | PRN
  Administered 2011-09-08: 2 mg via INTRAVENOUS

## 2011-09-08 MED ORDER — LACTATED RINGERS IV SOLN: INTRAVENOUS | Status: DC

## 2011-09-08 MED ORDER — BUPIVACAINE LIPOSOME 1.3 % IJ SUSP
INTRAMUSCULAR | Status: DC | PRN
  Administered 2011-09-08: 20 mL

## 2011-09-08 MED ORDER — SODIUM CHLORIDE 0.9 % IV SOLN: INTRAVENOUS | Status: DC

## 2011-09-08 MED ORDER — PROPOFOL 10 MG/ML IV EMUL
INTRAVENOUS | Status: DC | PRN
  Administered 2011-09-08: 200 mg via INTRAVENOUS

## 2011-09-08 MED ORDER — ACETAMINOPHEN 10 MG/ML IV SOLN
INTRAVENOUS | Status: AC
  Filled 2011-09-08: qty 100

## 2011-09-08 MED ORDER — NORETHINDRONE ACETATE 5 MG PO TABS
2.5000 mg | ORAL_TABLET | Freq: Every day | ORAL | Status: DC
  Administered 2011-09-08: 2.5 mg via ORAL
  Filled 2011-09-08 (×3): qty 1

## 2011-09-08 MED ORDER — FENTANYL CITRATE 0.05 MG/ML IJ SOLN: 25.0000 ug | INTRAMUSCULAR | Status: DC | PRN

## 2011-09-08 MED ORDER — TRAMADOL HCL 50 MG PO TABS
50.0000 mg | ORAL_TABLET | Freq: Four times a day (QID) | ORAL | Status: DC | PRN
  Administered 2011-09-09 – 2011-09-11 (×5): 100 mg via ORAL
  Filled 2011-09-08 (×5): qty 2

## 2011-09-08 MED ORDER — OXYCODONE HCL 5 MG PO TABS
5.0000 mg | ORAL_TABLET | ORAL | Status: DC | PRN
  Administered 2011-09-08 – 2011-09-09 (×4): 10 mg via ORAL
  Administered 2011-09-09: 5 mg via ORAL
  Administered 2011-09-09 (×2): 10 mg via ORAL
  Administered 2011-09-10 – 2011-09-11 (×7): 5 mg via ORAL
  Filled 2011-09-08 (×2): qty 1
  Filled 2011-09-08 (×3): qty 2
  Filled 2011-09-08: qty 1
  Filled 2011-09-08 (×2): qty 2
  Filled 2011-09-08: qty 1
  Filled 2011-09-08 (×2): qty 2
  Filled 2011-09-08 (×2): qty 1
  Filled 2011-09-08: qty 2

## 2011-09-08 MED ORDER — MORPHINE SULFATE 2 MG/ML IJ SOLN
1.0000 mg | INTRAMUSCULAR | Status: DC | PRN
  Administered 2011-09-08 (×2): 1 mg via INTRAVENOUS
  Administered 2011-09-08: 2 mg via INTRAVENOUS
  Administered 2011-09-09: 1 mg via INTRAVENOUS
  Administered 2011-09-09: 2 mg via INTRAVENOUS
  Filled 2011-09-08 (×5): qty 1

## 2011-09-08 MED ORDER — POLYETHYLENE GLYCOL 3350 17 G PO PACK: 17.0000 g | PACK | Freq: Every day | ORAL | Status: DC | PRN

## 2011-09-08 MED ORDER — FENTANYL CITRATE 0.05 MG/ML IJ SOLN
INTRAMUSCULAR | Status: AC
  Administered 2011-09-08: 50 ug
  Filled 2011-09-08: qty 2

## 2011-09-08 MED ORDER — METOCLOPRAMIDE HCL 5 MG/ML IJ SOLN: 5.0000 mg | Freq: Three times a day (TID) | INTRAMUSCULAR | Status: DC | PRN

## 2011-09-08 MED ORDER — ACETAMINOPHEN 10 MG/ML IV SOLN
1000.0000 mg | Freq: Once | INTRAVENOUS | Status: AC
  Administered 2011-09-08: 1000 mg via INTRAVENOUS
  Filled 2011-09-08: qty 100

## 2011-09-08 MED ORDER — METHOCARBAMOL 500 MG PO TABS
500.0000 mg | ORAL_TABLET | Freq: Four times a day (QID) | ORAL | Status: DC | PRN
  Administered 2011-09-09 – 2011-09-10 (×4): 500 mg via ORAL
  Filled 2011-09-08 (×4): qty 1

## 2011-09-08 MED ORDER — HYDROMORPHONE HCL PF 1 MG/ML IJ SOLN
INTRAMUSCULAR | Status: AC
  Administered 2011-09-08: 50 mg
  Filled 2011-09-08: qty 1

## 2011-09-08 MED ORDER — HYDROMORPHONE HCL PF 1 MG/ML IJ SOLN
INTRAMUSCULAR | Status: DC | PRN
  Administered 2011-09-08 (×4): 0.5 mg via INTRAVENOUS

## 2011-09-08 MED ORDER — ACETAMINOPHEN 650 MG RE SUPP: 650.0000 mg | Freq: Four times a day (QID) | RECTAL | Status: DC | PRN

## 2011-09-08 MED ORDER — ACETAMINOPHEN 10 MG/ML IV SOLN
1000.0000 mg | Freq: Four times a day (QID) | INTRAVENOUS | Status: AC
  Administered 2011-09-08 – 2011-09-09 (×4): 1000 mg via INTRAVENOUS
  Filled 2011-09-08 (×5): qty 100

## 2011-09-08 MED ORDER — MENTHOL 3 MG MT LOZG: 1.0000 | LOZENGE | OROMUCOSAL | Status: DC | PRN

## 2011-09-08 MED ORDER — GLYCOPYRROLATE 0.2 MG/ML IJ SOLN
INTRAMUSCULAR | Status: DC | PRN
  Administered 2011-09-08: .6 mg via INTRAVENOUS

## 2011-09-08 MED ORDER — HYDROMORPHONE HCL PF 1 MG/ML IJ SOLN
INTRAMUSCULAR | Status: AC
  Filled 2011-09-08: qty 1

## 2011-09-08 MED ORDER — CEFAZOLIN SODIUM 10 G IJ SOLR
3.0000 g | INTRAMUSCULAR | Status: AC
  Administered 2011-09-08: 2 g via INTRAVENOUS
  Filled 2011-09-08: qty 3000

## 2011-09-08 MED ORDER — LIDOCAINE HCL (CARDIAC) 20 MG/ML IV SOLN
INTRAVENOUS | Status: DC | PRN
  Administered 2011-09-08: 80 mg via INTRAVENOUS

## 2011-09-08 MED ORDER — DOCUSATE SODIUM 100 MG PO CAPS
100.0000 mg | ORAL_CAPSULE | Freq: Two times a day (BID) | ORAL | Status: DC
  Administered 2011-09-09 – 2011-09-10 (×5): 100 mg via ORAL

## 2011-09-08 MED ORDER — RIVAROXABAN 10 MG PO TABS
10.0000 mg | ORAL_TABLET | Freq: Every day | ORAL | Status: DC
  Administered 2011-09-09 – 2011-09-11 (×3): 10 mg via ORAL
  Filled 2011-09-08 (×4): qty 1

## 2011-09-08 MED ORDER — LACTATED RINGERS IV SOLN
INTRAVENOUS | Status: DC | PRN
  Administered 2011-09-08: 14:00:00 via INTRAVENOUS

## 2011-09-08 MED ORDER — 0.9 % SODIUM CHLORIDE (POUR BTL) OPTIME
TOPICAL | Status: DC | PRN
  Administered 2011-09-08: 1000 mL

## 2011-09-08 MED ORDER — ONDANSETRON HCL 4 MG PO TABS
4.0000 mg | ORAL_TABLET | Freq: Four times a day (QID) | ORAL | Status: DC | PRN
  Administered 2011-09-09: 4 mg via ORAL
  Filled 2011-09-08: qty 1

## 2011-09-08 MED ORDER — BUPIVACAINE LIPOSOME 1.3 % IJ SUSP
20.0000 mL | Freq: Once | INTRAMUSCULAR | Status: DC
  Filled 2011-09-08: qty 20

## 2011-09-08 MED ORDER — BISACODYL 10 MG RE SUPP: 10.0000 mg | Freq: Every day | RECTAL | Status: DC | PRN

## 2011-09-08 SURGICAL SUPPLY — 49 items
BAG SPEC THK2 15X12 ZIP CLS (MISCELLANEOUS) ×1
BAG ZIPLOCK 12X15 (MISCELLANEOUS) ×2 IMPLANT
BIT DRILL 2.8X128 (BIT) ×2 IMPLANT
BLADE EXTENDED COATED 6.5IN (ELECTRODE) ×2 IMPLANT
BLADE SAW SAG 73X25 THK (BLADE) ×1
BLADE SAW SGTL 73X25 THK (BLADE) ×1 IMPLANT
CLOTH BEACON ORANGE TIMEOUT ST (SAFETY) ×2 IMPLANT
DECANTER SPIKE VIAL GLASS SM (MISCELLANEOUS) ×2 IMPLANT
DRAPE INCISE IOBAN 66X45 STRL (DRAPES) ×2 IMPLANT
DRAPE ORTHO SPLIT 77X108 STRL (DRAPES) ×4
DRAPE POUCH INSTRU U-SHP 10X18 (DRAPES) ×2 IMPLANT
DRAPE SURG ORHT 6 SPLT 77X108 (DRAPES) ×2 IMPLANT
DRAPE U-SHAPE 47X51 STRL (DRAPES) ×2 IMPLANT
DRSG ADAPTIC 3X8 NADH LF (GAUZE/BANDAGES/DRESSINGS) ×2 IMPLANT
DRSG MEPILEX BORDER 4X4 (GAUZE/BANDAGES/DRESSINGS) ×2 IMPLANT
DRSG MEPILEX BORDER 4X8 (GAUZE/BANDAGES/DRESSINGS) ×2 IMPLANT
DURAPREP 26ML APPLICATOR (WOUND CARE) ×2 IMPLANT
ELECT REM PT RETURN 9FT ADLT (ELECTROSURGICAL) ×2
ELECTRODE REM PT RTRN 9FT ADLT (ELECTROSURGICAL) ×1 IMPLANT
EVACUATOR 1/8 PVC DRAIN (DRAIN) ×2 IMPLANT
FACESHIELD LNG OPTICON STERILE (SAFETY) ×8 IMPLANT
GLOVE BIO SURGEON STRL SZ7.5 (GLOVE) ×2 IMPLANT
GLOVE BIO SURGEON STRL SZ8 (GLOVE) ×2 IMPLANT
GLOVE BIOGEL PI IND STRL 8 (GLOVE) ×2 IMPLANT
GLOVE BIOGEL PI INDICATOR 8 (GLOVE) ×2
GOWN STRL NON-REIN LRG LVL3 (GOWN DISPOSABLE) ×2 IMPLANT
GOWN STRL REIN XL XLG (GOWN DISPOSABLE) ×4 IMPLANT
IMMOBILIZER KNEE 20 (SOFTGOODS) ×2
IMMOBILIZER KNEE 20 THIGH 36 (SOFTGOODS) IMPLANT
KIT BASIN OR (CUSTOM PROCEDURE TRAY) ×2 IMPLANT
MANIFOLD NEPTUNE II (INSTRUMENTS) ×2 IMPLANT
NDL SAFETY ECLIPSE 18X1.5 (NEEDLE) ×1 IMPLANT
NEEDLE HYPO 18GX1.5 SHARP (NEEDLE) ×2
NS IRRIG 1000ML POUR BTL (IV SOLUTION) ×2 IMPLANT
PACK TOTAL JOINT (CUSTOM PROCEDURE TRAY) ×2 IMPLANT
PASSER SUT SWANSON 36MM LOOP (INSTRUMENTS) ×2 IMPLANT
POSITIONER SURGICAL ARM (MISCELLANEOUS) ×2 IMPLANT
SPONGE GAUZE 4X4 12PLY (GAUZE/BANDAGES/DRESSINGS) ×2 IMPLANT
STRIP CLOSURE SKIN 1/2X4 (GAUZE/BANDAGES/DRESSINGS) ×4 IMPLANT
SUT ETHIBOND NAB CT1 #1 30IN (SUTURE) ×4 IMPLANT
SUT MNCRL AB 4-0 PS2 18 (SUTURE) ×2 IMPLANT
SUT VIC AB 2-0 CT1 27 (SUTURE) ×6
SUT VIC AB 2-0 CT1 TAPERPNT 27 (SUTURE) ×3 IMPLANT
SUT VLOC 180 0 24IN GS25 (SUTURE) ×3 IMPLANT
SYR 50ML LL SCALE MARK (SYRINGE) ×2 IMPLANT
TOWEL OR 17X26 10 PK STRL BLUE (TOWEL DISPOSABLE) ×4 IMPLANT
TOWEL OR NON WOVEN STRL DISP B (DISPOSABLE) ×1 IMPLANT
TRAY FOLEY CATH 14FRSI W/METER (CATHETERS) ×2 IMPLANT
WATER STERILE IRR 1500ML POUR (IV SOLUTION) ×3 IMPLANT

## 2011-09-08 NOTE — Interval H&P Note (Signed)
History and Physical Interval Note:  09/08/2011 2:10 PM  [name]  has presented today for surgery, with the diagnosis of Osteoarthritis of the Right Hip  The various methods of treatment have been discussed with the patient and family. After consideration of risks, benefits and other options for treatment, the patient has consented to  Procedure(s) (LRB): TOTAL HIP ARTHROPLASTY (Right) as a surgical intervention .  The patient's history has been reviewed, patient examined, no change in status, stable for surgery.  I have reviewed the patient's chart and labs.  Questions were answered to the patient's satisfaction.     Loanne Drilling

## 2011-09-08 NOTE — Anesthesia Preprocedure Evaluation (Addendum)
Anesthesia Evaluation  Patient identified by MRN, date of birth, ID band Patient awake    Reviewed: Allergy & Precautions, H&P , NPO status , Patient's Chart, lab work & pertinent test results  Airway Mallampati: II TM Distance: >3 FB Neck ROM: Full    Dental  (+) Teeth Intact and Dental Advisory Given   Pulmonary neg pulmonary ROS,  breath sounds clear to auscultation  Pulmonary exam normal       Cardiovascular hypertension, Pt. on medications negative cardio ROS  Rhythm:Regular     Neuro/Psych negative neurological ROS  negative psych ROS   GI/Hepatic negative GI ROS, Neg liver ROS,   Endo/Other  negative endocrine ROSHypothyroidism   Renal/GU negative Renal ROS  negative genitourinary   Musculoskeletal negative musculoskeletal ROS (+)   Abdominal   Peds  Hematology negative hematology ROS (+)   Anesthesia Other Findings   Reproductive/Obstetrics negative OB ROS                           Anesthesia Physical Anesthesia Plan  ASA: II  Anesthesia Plan: General   Post-op Pain Management:    Induction: Intravenous  Airway Management Planned: Oral ETT  Additional Equipment:   Intra-op Plan:   Post-operative Plan: Extubation in OR  Informed Consent: I have reviewed the patients History and Physical, chart, labs and discussed the procedure including the risks, benefits and alternatives for the proposed anesthesia with the patient or authorized representative who has indicated his/her understanding and acceptance.   Dental advisory given  Plan Discussed with: CRNA  Anesthesia Plan Comments:         Anesthesia Quick Evaluation

## 2011-09-08 NOTE — Preoperative (Signed)
Beta Blockers   Reason not to administer Beta Blockers:Not Applicable 

## 2011-09-08 NOTE — H&P (View-Only) (Signed)
[name]  DOB: 1945/08/08 Married / Language: English / Race: White / Female  Date of Admission:  09/08/2011  Chief Complaint:  Right Hip Pain  History of Present Illness The patient is a 66 year old female who comes in for a preoperative History and Physical. The patient is scheduled for a right total hip arthroplasty to be performed by Dr. Gus Rankin. Aluisio, MD at [facility] on 09/08/2011. The patient is a 66 year old female who presents for follow up of their hip. The patient is being followed for their right osteoarthritis. The patient has not gotten any relief of their symptoms with Cortisone injections (intra articular with Ramos). The last IA injection only helped for 3 weeks. She had this injection at the end of May. She is having increased difficulty with walking and with lifting the right leg. She was hopeful that the injection would work as well as the first one that she received in Feb which lasted 3 months. She does get relief with Tramadol. She is taking up to 6 per day (every 4 hours). She is ready to get the hip replaced because it is limiting her quality of life. Unfortunately her hip is getting progressively worse. This right hip is worse than the left one was prior to when she had that replacement years ago. It all started about six or eight months ago and now has led to persistent constant pain. She is having a hard time even sleeping. Pain is in the groin, her buttock radiating towards her knee sometimes going down to her foot. She is not having lower extremity weakness or paresthesia with this. They have been treated conservatively in the past for the above stated problem and despite conservative measures, they continue to have progressive pain and severe functional limitations and dysfunction. They have failed non-operative management including home exercise, medications, and injections. It is felt that they would benefit from undergoing total joint  replacement. Risks and benefits of the procedure have been discussed with the patient and they elect to proceed with surgery. There are no active contraindications to surgery such as ongoing infection or rapidly progressive neurological disease.  Allergies: NKDA   Problem List/Past Medical Osteoarthritis, Hip (715.35) Knee Pain (719.46) Lumbago (724.2). 10/15/2009 High blood pressure Hypothyroidism Mech cmpl intrn orth dev/implant/graft  Shingles Hypercholesterolemia Urinary Incontinence Measles Mumps Rubella Menopause  Family History Heart Disease. mother and father Hypertension. mother and father Diabetes Mellitus. mother Father. Deceased, Heart disease. age 31 Mother. Deceased, Heart disease, Diabetes mellitus, Type II. age 54   Social History Living situation. live with spouse Marital status. married Number of flights of stairs before winded. greater than 5 Drug/Alcohol Rehab (Previously). no Exercise. Exercises weekly; does team sport Illicit drug use. no Pain Contract. no Tobacco / smoke exposure. no Tobacco use. 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 never consumed alcohol Post-Surgical Plans. Plan is to go home. Advance Directives. Living Will, Healthcare POA   Medication History TraMADol HCl (50MG  Tablet, 1-2 Tablet Oral every 6 hours as needed for pain, Taken starting 09/01/2011) Active. Lisinopril ( Oral) Specific dose unknown - Active. Levothyroxine Sodium ( Oral) Specific dose unknown - Active. Estratest HS ( Oral) Active. progesterone Active. (oral tablet)   Past Surgical History Total Hip Replacement. left Total Knee Replacement. Right   Review of Systems General:Not Present- Chills, Fever, Night Sweats, Fatigue, Weight Gain, Weight Loss and Memory Loss. Skin:Not Present- Hives, Itching, Rash, Eczema and  Lesions. HEENT:Not Present- Tinnitus,  Headache, Double Vision, Visual Loss, Hearing Loss and Dentures. Respiratory:Not Present- Shortness of breath with exertion, Shortness of breath at rest, Allergies, Coughing up blood and Chronic Cough. Cardiovascular:Not Present- Chest Pain, Racing/skipping heartbeats, Difficulty Breathing Lying Down, Murmur, Swelling and Palpitations. Gastrointestinal:Not Present- Bloody Stool, Heartburn, Abdominal Pain, Vomiting, Nausea, Constipation, Diarrhea, Difficulty Swallowing, Jaundice and Loss of appetitie. Female Genitourinary:Not Present- Blood in Urine, Urinary frequency, Weak urinary stream, Discharge, Flank Pain, Incontinence, Painful Urination, Urgency, Urinary Retention and Urinating at Night. Musculoskeletal:Not Present- Muscle Weakness, Muscle Pain, Joint Swelling, Joint Pain, Back Pain, Morning Stiffness and Spasms. Neurological:Not Present- Tremor, Dizziness, Blackout spells, Paralysis, Difficulty with balance and Weakness. Psychiatric:Not Present- Insomnia.   Vitals Weight: 183 lb Height: 64 in Body Surface Area: 1.94 m Body Mass Index: 31.41 kg/m Pulse: 80 (Regular) Resp.: 14 (Unlabored) BP: 136/78 (Sitting, Left Arm, Standard)   Physical Exam The physical exam findings are as follows:  Patient is a 66 year old female with continued hip pain.   General Mental Status - Alert, cooperative and good historian. General Appearance- pleasant. Not in acute distress. Orientation- Oriented X3. Build & Nutrition- Well nourished and Well developed.   Head and Neck Head- normocephalic, atraumatic . Neck Global Assessment- supple. no bruit auscultated on the right and no bruit auscultated on the left.   Eye Pupil- Bilateral- Regular and Round. Note: wears glasses Motion- Bilateral- EOMI.   Chest and Lung Exam Auscultation: Breath sounds:- clear at anterior chest wall and - clear at posterior chest wall. Adventitious sounds:- No Adventitious  sounds.   Cardiovascular Auscultation:Rhythm- Regular rate and rhythm. Heart Sounds- S1 WNL and S2 WNL. Murmurs & Other Heart Sounds:Auscultation of the heart reveals - No Murmurs.   Abdomen Palpation/Percussion:Tenderness- Abdomen is non-tender to palpation. Rigidity (guarding)- Abdomen is soft. Auscultation:Auscultation of the abdomen reveals - Bowel sounds normal.   Female Genitourinary Not done, not pertinent to present illness  Musculoskeletal On exam she is alert and oriented in no apparent distress. Her right hip can be flexed about 90 with no internal rotation, about 20 external rotation, 20 abduction.  RADIOGRAPHS: Radiographs show that she has bone on bone arthritis in the right hip. She had a normal joint space two years ago and had minimal narrowing about six months ago.  Assessment & Plan Osteoarthritis Right Hip  Note: Patient is for a Right Total Hip Replacement by Dr. Lequita Halt.  Plan is to go home.  PCP - Dr. Synetta Fail  Signed electronically by Roberts Gaudy, PA-C

## 2011-09-08 NOTE — Transfer of Care (Signed)
Immediate Anesthesia Transfer of Care Note  Patient: [name]  Procedure(s) Performed: Procedure(s) (LRB): TOTAL HIP ARTHROPLASTY (Right)  Patient Location: PACU  Anesthesia Type: General  Level of Consciousness: awake, alert , oriented and patient cooperative  Airway & Oxygen Therapy: Patient Spontanous Breathing and Patient connected to face mask oxygen  Post-op Assessment: Report given to PACU RN and Post -op Vital signs reviewed and stable  Post vital signs: Reviewed and stable  Complications: No apparent anesthesia complications

## 2011-09-08 NOTE — Op Note (Signed)
Pre-operative diagnosis- Osteoarthritis Right hip  Post-operative diagnosis- Osteoarthritis  Right hip  Procedure-  RightTotal Hip Arthroplasty  Surgeon- Gus Rankin. Prashant Glosser, MD  Assistant- Avel Peace, PA-C   Anesthesia  General  EBL- 300   Drain Hemovac   Complication- None  Condition-PACU - hemodynamically stable.   Brief Clinical Note-  [name] is a 66 y.o. female with end stage arthritis of her right hip with progressively worsening pain and dysfunction. Pain occurs with activity and rest including pain at night. She has tried analgesics, protected weight bearing and rest without benefit. Pain is too severe to attempt physical therapy. Radiographs demonstrate bone on bone arthritis with subchondral cyst formation. She presents now for right THA.  Procedure in detail-   The patient is brought into the operating room and placed on the operating table. After successful administration of General  anesthesia, the patient is placed in the  Left lateral decubitus position with the  Right side up and held in place with the hip positioner. The lower extremity is isolated from the perineum with plastic drapes and time-out is performed by the surgical team. The lower extremity is then prepped and draped in the usual sterile fashion. A short posterolateral incision is made with a ten blade through the subcutaneous tissue to the level of the fascia lata which is incised in line with the skin incision. The sciatic nerve is palpated and protected and the short external rotators and capsule are isolated from the femur. The hip is then dislocated and the center of the femoral head is marked. A trial prosthesis is placed such that the trial head corresponds to the center of the patients' native femoral head. The resection level is marked on the femoral neck and the resection is made with an oscillating saw. The femoral head is removed and femoral retractors placed to gain access to the femoral  canal.      The canal finder is passed into the femoral canal and the canal is thoroughly irrigated with sterile saline to remove the fatty contents. Axial reaming is performed to 11.5  mm, proximal reaming to 16D  and the sleeve machined to a large. A 16D large trial sleeve is placed into the proximal femur.      The femur is then retracted anteriorly to gain acetabular exposure. Acetabular retractors are placed and the labrum and osteophytes are removed, Acetabular reaming is performed to 49  mm and a 50  mm Pinnacle acetabular shell is placed in anatomic position with excellent purchase. Additional dome screws were not needed. The permanent 32 mm neutral + 4 Marathon liner is placed into the acetabular shell.      The trial femur is then placed into the femoral canal. The size is 16 x 11  stem with a 36 standard  neck and a 32 + 0 head with the neck version matching  the patients' native anteversion. The hip is reduced with excellent stability with full extension and full external rotation, 70 degrees flexion with 40 degrees adduction and 90 degrees internal rotation and 90 degrees of flexion with 70 degrees of internal rotation. The operative leg is placed on top of the non-operative leg and the leg lengths are found to be equal. The trials are then removed and the permanent implant of the same size is impacted into the femoral canal. The  ceramic femoral head of the same size as the trial is placed and the hip is reduced with the same stability parameters.  The operative leg is again placed on top of the non-operative leg and the leg lengths are found to be equal.      The wound is then copiously irrigated with saline solution and the capsule and short external rotators are re-attached to the femur through drill holes with Ethibond suture. The fascia lata is closed over a hemovac drain with #1 vicryl suture and the fascia lata, gluteal muscles and subcutaneous tissues are injected with Exparel 20ml diluted  with saline 50ml. The subcutaneous tissues are closed with #1 and2-0 vicryl and the subcuticular layer closed with running 4-0 Monocryl. The drain is hooked to suction, incision cleaned and dried, and steri-srips and a bulky sterile dressing applied. The limb is placed into a knee immobilizer and the patient is awakened and transported to recovery in stable condition.      Please note that a surgical assistant was a medical necessity for this procedure in order to perform it in a safe and expeditious manner. The assistant was necessary to provide retraction to the vital neurovascular structures and to retract and position the limb to allow for anatomic placement of the prosthetic components.  Gus Rankin Arlene Brickel, MD    09/08/2011, 3:53 PM

## 2011-09-08 NOTE — Anesthesia Postprocedure Evaluation (Signed)
  Anesthesia Post-op Note  Patient: [name]  Procedure(s) Performed: Procedure(s) (LRB): TOTAL HIP ARTHROPLASTY (Right)  Patient Location: PACU  Anesthesia Type: General  Level of Consciousness: awake and alert   Airway and Oxygen Therapy: Patient Spontanous Breathing  Post-op Pain: mild  Post-op Assessment: Post-op Vital signs reviewed, Patient's Cardiovascular Status Stable, Respiratory Function Stable, Patent Airway and No signs of Nausea or vomiting  Post-op Vital Signs: stable  Complications: No apparent anesthesia complications

## 2011-09-09 ENCOUNTER — Encounter (HOSPITAL_COMMUNITY): Payer: Self-pay | Admitting: Orthopedic Surgery

## 2011-09-09 LAB — CBC
HCT: 30.6 % — ABNORMAL LOW (ref 36.0–46.0)
Hemoglobin: 10.7 g/dL — ABNORMAL LOW (ref 12.0–15.0)
RBC: 3.52 MIL/uL — ABNORMAL LOW (ref 3.87–5.11)
WBC: 8.2 10*3/uL (ref 4.0–10.5)

## 2011-09-09 LAB — BASIC METABOLIC PANEL
CO2: 24 mEq/L (ref 19–32)
Chloride: 102 mEq/L (ref 96–112)
GFR calc non Af Amer: >90 mL/min (ref >90)
Glucose, Bld: 123 mg/dL — ABNORMAL HIGH (ref 70–99)
Potassium: 3.6 mEq/L (ref 3.5–5.1)
Sodium: 135 mEq/L (ref 135–145)

## 2011-09-09 NOTE — Progress Notes (Signed)
Utilization review completed.  

## 2011-09-09 NOTE — Evaluation (Signed)
Physical Therapy Evaluation Patient Details Name: [name] MRN: [id number] DOB: [birth year] Today's Date: 09/09/2011 Time: 7425-9563 PT Time Calculation (min): 36 min  PT Assessment / Plan / Recommendation Clinical Impression  Pt wtih R THR presents with decreased R LE strength/ROM, elevated pain level and decreased functional mobility    PT Assessment  Patient needs continued PT services    Follow Up Recommendations  Home health PT    Barriers to Discharge        Equipment Recommendations  None recommended by PT    Recommendations for Other Services OT consult   Frequency 7X/week    Precautions / Restrictions Precautions Precautions: Posterior Hip Precaution Comments: sign hung in room Restrictions Weight Bearing Restrictions: No Other Position/Activity Restrictions: WBAT   Pertinent Vitals/Pain 6-7/10; pt premedicated, ice pack provided      Mobility  Bed Mobility Bed Mobility: Supine to Sit Supine to Sit: 1: +2 Total assist Details for Bed Mobility Assistance: cues for sequence and use of UEs, assist for R LE management and to bring trunk to erect Transfers Transfers: Sit to Stand;Stand to Sit Sit to Stand: 3: Mod assist Stand to Sit: 3: Mod assist Details for Transfer Assistance: cues for use of UEs and for LE management Ambulation/Gait Ambulation/Gait Assistance: 3: Mod assist Ambulation Distance (Feet): 5 Feet Assistive device: Rolling walker Ambulation/Gait Assistance Details: cues for posture, sequence, position from RW, stride length, and increased UE WB Gait Pattern: Step-to pattern;Decreased step length - right;Decreased step length - left;Decreased stance time - left    Exercises Total Joint Exercises Ankle Circles/Pumps: AROM;10 reps;Supine;Both Quad Sets: AROM;Both;10 reps;Supine Heel Slides: AAROM;10 reps;Right;Supine Hip ABduction/ADduction: AAROM;Right;10 reps;Supine   PT Diagnosis: Difficulty walking  PT Problem List: Decreased  strength;Decreased range of motion;Decreased activity tolerance;Decreased mobility;Decreased knowledge of use of DME;Pain;Decreased knowledge of precautions PT Treatment Interventions: DME instruction;Gait training;Stair training;Functional mobility training;Therapeutic activities;Therapeutic exercise;Patient/family education   PT Goals Acute Rehab PT Goals PT Goal Formulation: With patient Time For Goal Achievement: 09/16/11 Potential to Achieve Goals: Good Pt will go Supine/Side to Sit: with supervision PT Goal: Supine/Side to Sit - Progress: Goal set today Pt will go Sit to Supine/Side: with supervision PT Goal: Sit to Supine/Side - Progress: Goal set today Pt will go Sit to Stand: with supervision PT Goal: Sit to Stand - Progress: Goal set today Pt will go Stand to Sit: with supervision PT Goal: Stand to Sit - Progress: Goal set today Pt will Ambulate: 51 - 150 feet;with supervision;with rolling walker PT Goal: Ambulate - Progress: Goal set today Pt will Go Up / Down Stairs: 1-2 stairs;with min assist;with rolling walker PT Goal: Up/Down Stairs - Progress: Goal set today  Visit Information  Last PT Received On: 09/09/11 Assistance Needed: +2    Subjective Data  Subjective: I've had the other hip done and a knee so I hope this is it for me Patient Stated Goal: Resume previous lifestyle with decreased pain   Prior Functioning  Home Living Lives With: Spouse Available Help at Discharge: Family Type of Home: House Home Access: Stairs to enter Secretary/administrator of Steps: 1 (1 + 1) Entrance Stairs-Rails: None Home Layout: One level Home Adaptive Equipment: Walker - standard Prior Function Level of Independence: Independent Able to Take Stairs?: Yes Driving: Yes Communication Communication: No difficulties    Cognition  Overall Cognitive Status: Appears within functional limits for tasks assessed/performed Arousal/Alertness: Awake/alert Orientation Level: Appears  intact for tasks assessed Behavior During Session: [facility] for tasks performed  Extremity/Trunk Assessment Right Upper Extremity Assessment RUE ROM/Strength/Tone: WFL for tasks assessed Left Upper Extremity Assessment LUE ROM/Strength/Tone: WFL for tasks assessed Right Lower Extremity Assessment RLE ROM/Strength/Tone: Deficits RLE ROM/Strength/Tone Deficits: 2/5 hip strength; aarom hip flex to 50 and abd to 20 Left Lower Extremity Assessment LLE ROM/Strength/Tone: [facility] for tasks assessed   Balance    End of Session PT - End of Session Equipment Utilized During Treatment: Gait belt Activity Tolerance: Patient limited by pain;Other (comment) (c/o dizziness - BP 131/ 74) Patient left: in chair;with call bell/phone within reach;with family/visitor present Nurse Communication: Mobility status  GP     Micaella Gitto 09/09/2011, 12:58 PM

## 2011-09-09 NOTE — Clinical Documentation Improvement (Signed)
Anemia Blood Loss Clarification  THIS DOCUMENT IS NOT A PERMANENT PART OF THE MEDICAL RECORD  RESPOND TO THE THIS QUERY, FOLLOW THE INSTRUCTIONS BELOW:  1. If needed, update documentation for the patient's encounter via the notes activity.  2. Access this query again and click edit on the In [employer].  3. After updating, or not, click F2 to complete all highlighted (required) fields concerning your review. Select "additional documentation in the medical record" OR "no additional documentation provided".  4. Click Sign note button.  5. The deficiency will fall out of your In Basket *Please let us know if you are not able to complete this workflow by phone or e-mail (listed below).        09/09/11  Dear Dr. Lequita Halt, F/Associates  In an effort to better capture your patient's severity of illness, reflect appropriate length of stay and utilization of resources, a review of the patient medical record has revealed the following indicators.    Based on your clinical judgment, please clarify and document in a progress note and/or discharge summary the clinical condition associated with the following supporting information:  In responding to this query please exercise your independent judgment.  The fact that a query is asked, does not imply that any particular answer is desired or expected.  Pt admitted for  R THR.  According to lab pt's H/H= 10.7/30.6     Please clarify if abnormal H/H in setting of post op R THR can be further specified as one of the diagnoses listed below and document in pn or d/c summary.      Possible Clinical Conditions?   " Expected Acute Blood Loss Anemia  " Acute Blood Loss Anemia  " Acute on chronic blood loss anemia  " Other Condition________________  " Cannot Clinically Determine  Risk Factors: (recent surgery, pre op anemia, EBL in OR)  Supporting Information: OA, knee pain, HTN,  Signs and Symptoms    Diagnostics: EBL:  300  Component     Latest Ref Rng 09/09/2011  Hemoglobin     12.0 - 15.0 g/dL 16.1 (L)  HCT     09.6 - 46.0 % 30.6 (L)   Treatments: Transfusion: IV fluids :  0.9 % NaCl with KCl 20 mEq/    Serial H&H monitoring   Reviewed: additional documentation in the medical record-documented in 7/25 note  Thank You,  Enis Slipper  RN, BSN, CCDS Clinical Documentation Specialist [facility] Dept Pager: [phone] / E-mail: [email or web address] Health Information Management [place]

## 2011-09-09 NOTE — Progress Notes (Signed)
After patient was dangled, was notified by tech that the hemovac was leaking. When assessing the site it appeared that the hemovac was out of the incision site. Hemovac was pulled at this time and dsg was reinforced. Will continue to monitor patient.

## 2011-09-09 NOTE — Progress Notes (Signed)
Physical Therapy Treatment Patient Details Name: ANNALEIA PENCE MRN: [id number] DOB: 1945/12/17 Today's Date: 09/09/2011 Time: 4098-1191 PT Time Calculation (min): 19 min  PT Assessment / Plan / Recommendation Comments on Treatment Session       Follow Up Recommendations  Home health PT    Barriers to Discharge        Equipment Recommendations  None recommended by PT    Recommendations for Other Services OT consult  Frequency 7X/week   Plan Discharge plan remains appropriate    Precautions / Restrictions Precautions Precautions: Posterior Hip Restrictions Weight Bearing Restrictions: No Other Position/Activity Restrictions: WBAT   Pertinent Vitals/Pain 4/10    Mobility  Bed Mobility Bed Mobility: Sit to Supine Sit to Supine: 1: +2 Total assist Sit to Supine: Patient Percentage: 70% Details for Bed Mobility Assistance: cues for sequence and use of UEs, assist for R LE management and to bring trunk to erect Transfers Transfers: Sit to Stand;Stand to Sit Sit to Stand: 4: Min assist;3: Mod assist Stand to Sit: 4: Min assist;3: Mod assist Details for Transfer Assistance: cues for use of UEs and for LE management Ambulation/Gait Ambulation/Gait Assistance: 4: Min assist;3: Mod assist Ambulation Distance (Feet): 48 Feet Assistive device: Rolling walker Ambulation/Gait Assistance Details: cues for sequence, posture, position from RW and ER on R Gait Pattern: Step-to pattern;Decreased step length - right;Decreased step length - left;Decreased stance time - left    Exercises     PT Diagnosis:    PT Problem List:   PT Treatment Interventions:     PT Goals Acute Rehab PT Goals PT Goal Formulation: With patient Time For Goal Achievement: 09/16/11 Potential to Achieve Goals: Good Pt will go Supine/Side to Sit: with supervision PT Goal: Supine/Side to Sit - Progress: Goal set today Pt will go Sit to Supine/Side: with supervision PT Goal: Sit to Supine/Side - Progress:  Goal set today Pt will go Sit to Stand: with supervision PT Goal: Sit to Stand - Progress: Progressing toward goal Pt will go Stand to Sit: with supervision PT Goal: Stand to Sit - Progress: Progressing toward goal Pt will Ambulate: 51 - 150 feet;with supervision;with rolling walker PT Goal: Ambulate - Progress: Progressing toward goal Pt will Go Up / Down Stairs: 1-2 stairs;with min assist;with rolling walker PT Goal: Up/Down Stairs - Progress: Goal set today  Visit Information  Last PT Received On: 09/09/11 Assistance Needed: +1    Subjective Data  Subjective: My pain is doing much better Patient Stated Goal: Resume previous lifestyle with decreased pain   Cognition  Overall Cognitive Status: Appears within functional limits for tasks assessed/performed Arousal/Alertness: Awake/alert Orientation Level: Appears intact for tasks assessed Behavior During Session: [facility] for tasks performed    Balance     End of Session PT - End of Session Equipment Utilized During Treatment: Gait belt Activity Tolerance: Patient tolerated treatment well Patient left: in bed;with call bell/phone within reach;with family/visitor present Nurse Communication: Mobility status   GP     Arvil Utz 09/09/2011, 3:51 PM

## 2011-09-09 NOTE — Progress Notes (Signed)
   Subjective: 1 Day Post-Op Procedure(s) (LRB): TOTAL HIP ARTHROPLASTY (Right) Patient reports pain as mild.   Patient seen in rounds with Dr. Lequita Halt. Patient is well, and has had no acute complaints or problems We will start therapy today.  Plan is to go Home after hospital stay.  Objective: Vital signs in last 24 hours: Temp:  [97.5 F (36.4 C)-98.4 F (36.9 C)] 98.3 F (36.8 C) (07/23 0502) Pulse Rate:  [66-96] 94  (07/23 0502) Resp:  [11-20] 16  (07/23 0502) BP: (134-181)/(52-83) 145/79 mmHg (07/23 0502) SpO2:  [98 %-100 %] 100 % (07/23 0502) Weight:  [83 kg (182 lb 15.7 oz)] 83 kg (182 lb 15.7 oz) (07/22 1740)  Intake/Output from previous day:  Intake/Output Summary (Last 24 hours) at 09/09/11 0805 Last data filed at 09/09/11 0735  Gross per 24 hour  Intake   3845 ml  Output   1820 ml  Net   2025 ml    Intake/Output this shift: Total I/O In: 100 [P.O.:100] Out: -   Labs:  Basename 09/09/11 0420  HGB 10.7*    Basename 09/09/11 0420  WBC 8.2  RBC 3.52*  HCT 30.6*  PLT 198    Basename 09/09/11 0420  NA 135  K 3.6  CL 102  CO2 24  BUN 10  CREATININE 0.62  GLUCOSE 123*  CALCIUM 7.8*   No results found for this basename: LABPT:2,INR:2 in the last 72 hours  EXAM General - Patient is Alert, Appropriate and Oriented Extremity - Neurovascular intact Sensation intact distally Dorsiflexion/Plantar flexion intact Dressing - dressing C/D/I Motor Function - intact, moving foot and toes well on exam.  Hemovac pulled without difficulty.  Past Medical History  Diagnosis Date  . Hypertension   . Hypothyroidism   . Arthritis     Assessment/Plan: 1 Day Post-Op Procedure(s) (LRB): TOTAL HIP ARTHROPLASTY (Right) Principal Problem:  *OA (osteoarthritis) of hip   Advance diet Up with therapy Continue foley due to strict I&O and urinary output monitoring Discharge home with home health  DVT Prophylaxis - Xarelto Weight Bearing As Tolerated right  Leg D/C Knee Immobilizer Hemovac Pulled Begin Therapy Hip Preacutions Keep foley until tomorrow. No vaccines.  [name] 09/09/2011, 8:05 AM

## 2011-09-10 DIAGNOSIS — E871 Hypo-osmolality and hyponatremia: Secondary | ICD-10-CM | POA: Diagnosis not present

## 2011-09-10 LAB — BASIC METABOLIC PANEL
Calcium: 8.2 mg/dL — ABNORMAL LOW (ref 8.4–10.5)
Chloride: 102 mEq/L (ref 96–112)
Creatinine, Ser: 0.54 mg/dL (ref 0.50–1.10)
GFR calc Af Amer: >90 mL/min (ref >90)
GFR calc non Af Amer: >90 mL/min (ref >90)

## 2011-09-10 LAB — CBC
MCHC: 35.3 g/dL (ref 30.0–36.0)
MCV: 87.7 fL (ref 78.0–100.0)
Platelets: 206 10*3/uL (ref 150–400)
RDW: 12.8 % (ref 11.5–15.5)
WBC: 10.7 10*3/uL — ABNORMAL HIGH (ref 4.0–10.5)

## 2011-09-10 MED ORDER — TEMAZEPAM 15 MG PO CAPS
15.0000 mg | ORAL_CAPSULE | Freq: Every evening | ORAL | Status: DC | PRN
  Administered 2011-09-10: 15 mg via ORAL
  Filled 2011-09-10: qty 1

## 2011-09-10 NOTE — Plan of Care (Signed)
Problem: Consults Goal: Diagnosis- Total Joint Replacement Outcome: Completed/Met Date Met:  09/10/11 Primary Total Hip RIGHT     

## 2011-09-10 NOTE — Progress Notes (Signed)
Physical Therapy Treatment Patient Details Name: [name] MRN: [id number] DOB: 1945-04-01 Today's Date: 09/10/2011 Time: 0827-0906 PT Time Calculation (min): 39 min  PT Assessment / Plan / Recommendation Comments on Treatment Session       Follow Up Recommendations  Home health PT    Barriers to Discharge        Equipment Recommendations  None recommended by PT;None recommended by OT    Recommendations for Other Services OT consult  Frequency 7X/week   Plan Discharge plan remains appropriate    Precautions / Restrictions Precautions Precautions: Posterior Hip Precaution Comments: reviewed THPs with pt/spouse Restrictions Weight Bearing Restrictions: No Other Position/Activity Restrictions: WBAT   Pertinent Vitals/Pain Min c/o pain; pt premedicated, ice packs provided    Mobility  Bed Mobility Bed Mobility: Supine to Sit;Sit to Supine Supine to Sit: 4: Min assist Sit to Supine: 4: Min assist Details for Bed Mobility Assistance: cues for sequence and use of UEs, assist for R LE management and to bring trunk to erect Transfers Transfers: Sit to Stand;Stand to Sit Sit to Stand: 4: Min assist;With upper extremity assist;From chair/3-in-1 Stand to Sit: 4: Min assist;With upper extremity assist;To chair/3-in-1 Details for Transfer Assistance: min verbal cues for hand placement and R LE forward Ambulation/Gait Ambulation/Gait Assistance: 4: Min assist Ambulation Distance (Feet): 100 Feet Assistive device: Rolling walker Ambulation/Gait Assistance Details: cues for position from RW, increased heel contact on L LE, posture, ER on R, and position fom RW Gait Pattern: Step-to pattern;Decreased step length - left;Decreased stance time - right    Exercises Total Joint Exercises Ankle Circles/Pumps: AROM;20 reps;Supine;Both Quad Sets: AROM;20 reps;Both;Supine Gluteal Sets: AROM;10 reps;Both;Supine Heel Slides: AAROM;20 reps;Supine;Right Hip ABduction/ADduction: AAROM;20  reps;Supine;Both   PT Diagnosis:    PT Problem List:   PT Treatment Interventions:     PT Goals Acute Rehab PT Goals PT Goal Formulation: With patient Time For Goal Achievement: 09/16/11 Potential to Achieve Goals: Good Pt will go Supine/Side to Sit: with supervision PT Goal: Supine/Side to Sit - Progress: Progressing toward goal Pt will go Sit to Supine/Side: with supervision PT Goal: Sit to Supine/Side - Progress: Progressing toward goal Pt will go Sit to Stand: with supervision PT Goal: Sit to Stand - Progress: Progressing toward goal Pt will go Stand to Sit: with supervision PT Goal: Stand to Sit - Progress: Progressing toward goal Pt will Ambulate: 51 - 150 feet;with supervision;with rolling walker PT Goal: Ambulate - Progress: Progressing toward goal  Visit Information  Last PT Received On: 09/10/11 Assistance Needed: +1    Subjective Data  Subjective: I'm doing much better than yesterday Patient Stated Goal: Resume previous lifestyle with decreased pain   Cognition  Overall Cognitive Status: Appears within functional limits for tasks assessed/performed Arousal/Alertness: Awake/alert Orientation Level: Appears intact for tasks assessed Behavior During Session: [facility] for tasks performed    Balance  Balance Balance Assessed: Yes Dynamic Standing Balance Dynamic Standing - Balance Support: No upper extremity supported Dynamic Standing - Level of Assistance: 4: Min assist  End of Session PT - End of Session Equipment Utilized During Treatment: Gait belt Activity Tolerance: Patient tolerated treatment well Patient left: in chair;with call bell/phone within reach;with family/visitor present Nurse Communication: Mobility status   GP     [name] 09/10/2011, 12:21 PM

## 2011-09-10 NOTE — Care Management Note (Signed)
    Page 1 of 2   09/11/2011     12:34:16 PM   CARE MANAGEMENT NOTE 09/11/2011  Patient:  [name], [name]   Account Number:  [id number]  Date Initiated:  09/09/2011  Documentation initiated by:  Colleen Can  Subjective/Objective Assessment:   dx osteoarthritis hip-right  Total hip replacemnt     Action/Plan:   CM spoke with patient . Plans are for pt's return to her home in [facility] where spouse will be caregiver. [name] will provide [facility] services and arrange for DME if needed   Anticipated DC Date:  09/11/2011   Anticipated DC Plan:  HOME W HOME HEALTH SERVICES  In-house referral  NA      DC Planning Services  CM consult      [facility] Choice  HOME HEALTH   Choice offered to / List presented to:  C-1 Patient   DME arranged  NA      DME agency  NA     HH arranged  HH-2 PT      [facility] agency  [facility]   Status of service:  Completed, signed off Medicare Important Message given?  NA - LOS <3 / Initial given by admissions (If response is "NO", the following Medicare IM given date fields will be blank) Date Medicare IM given:   Date Additional Medicare IM given:    Discharge Disposition:  HOME W HOME HEALTH SERVICES  Per UR Regulation:    If discussed at Long Length of Stay Meetings, dates discussed:    Comments:  09/11/2011 [name] bsn ccm [phone] Pt discharged today with hh services in place. HH services to start tomorrow 09/12/2011.

## 2011-09-10 NOTE — Progress Notes (Signed)
   Subjective: 2 Days Post-Op Procedure(s) (LRB): TOTAL HIP ARTHROPLASTY (Right) Patient reports pain as mild.   Patient seen in rounds with Dr. Lequita Halt. Patient is well, and has had no acute complaints or problems Plan is to go Home after hospital stay.  Objective: Vital signs in last 24 hours: Temp:  [98.1 F (36.7 C)-98.7 F (37.1 C)] 98.7 F (37.1 C) (07/24 1400) Pulse Rate:  [89-92] 89  (07/24 1400) Resp:  [16] 16  (07/24 1400) BP: (131-134)/(70-78) 131/70 mmHg (07/24 1400) SpO2:  [98 %-99 %] 99 % (07/24 1400)  Intake/Output from previous day:  Intake/Output Summary (Last 24 hours) at 09/10/11 2116 Last data filed at 09/10/11 2100  Gross per 24 hour  Intake 1582.66 ml  Output   1800 ml  Net -217.34 ml    Intake/Output this shift: Total I/O In: -  Out: 150 [Urine:150]  Labs:  [facility] 09/10/11 0417 09/09/11 0420  HGB 10.1* 10.7*    Basename 09/10/11 0417 09/09/11 0420  WBC 10.7* 8.2  RBC 3.26* 3.52*  HCT 28.6* 30.6*  PLT 206 198    Basename 09/10/11 0417 09/09/11 0420  NA 134* 135  K 3.7 3.6  CL 102 102  CO2 24 24  BUN 9 10  CREATININE 0.54 0.62  GLUCOSE 120* 123*  CALCIUM 8.2* 7.8*   No results found for this basename: LABPT:2,INR:2 in the last 72 hours  EXAM General - Patient is Alert, Appropriate and Oriented Extremity - Neurovascular intact Sensation intact distally Dorsiflexion/Plantar flexion intact No cellulitis present Dressing/Incision - clean, dry, no drainage, healing Motor Function - intact, moving foot and toes well on exam.   Past Medical History  Diagnosis Date  . Hypertension   . Hypothyroidism   . Arthritis     Assessment/Plan: 2 Days Post-Op Procedure(s) (LRB): TOTAL HIP ARTHROPLASTY (Right) Principal Problem:  *OA (osteoarthritis) of hip Active Problems:  Postop Hyponatremia   Up with therapy Plan for discharge tomorrow Discharge home with home health  DVT Prophylaxis - Xarelto Weight Bearing As Tolerated  right Leg  Wileen Duncanson 09/10/2011, 9:16 PM

## 2011-09-10 NOTE — Progress Notes (Signed)
Physical Therapy Treatment Patient Details Name: [name] MRN: [id number] DOB: 20-Jul-1945 Today's Date: 09/10/2011 Time: 4098-1191 PT Time Calculation (min): 14 min  PT Assessment / Plan / Recommendation Comments on Treatment Session  Reviewed car transfers with pt and spouse as well as up/down stairs with husband participating    Follow Up Recommendations  Home health PT    Barriers to Discharge        Equipment Recommendations  None recommended by PT;None recommended by OT    Recommendations for Other Services OT consult  Frequency 7X/week   Plan Discharge plan remains appropriate    Precautions / Restrictions Precautions Precautions: Posterior Hip Precaution Comments: reviewed THPs with pt/spouse Restrictions Weight Bearing Restrictions: No Other Position/Activity Restrictions: WBAT   Pertinent Vitals/Pain     Mobility       Exercises     PT Diagnosis:    PT Problem List:   PT Treatment Interventions:     PT Goals Acute Rehab PT Goals PT Goal Formulation: With patient Time For Goal Achievement: 09/16/11 Potential to Achieve Goals: Good  Visit Information  Last PT Received On: 09/10/11 Assistance Needed: +1    Subjective Data  Subjective: I'm doing ok but I just got back to bed Patient Stated Goal: Resume previous lifestyle with decreased pain   Cognition  Overall Cognitive Status: Appears within functional limits for tasks assessed/performed Arousal/Alertness: Awake/alert Orientation Level: Appears intact for tasks assessed Behavior During Session: [facility] for tasks performed    Balance     End of Session PT - End of Session Activity Tolerance: Patient tolerated treatment well Patient left: in bed;with call bell/phone within reach;with family/visitor present Nurse Communication: Mobility status   GP     [name] 09/10/2011, 4:26 PM

## 2011-09-10 NOTE — Evaluation (Addendum)
Occupational Therapy One time Evaluation Patient Details Name: [name] MRN: [id number] DOB: 1945/07/02 Today's Date: 09/10/2011 Time: 1478-2956 OT Time Calculation (min): 33 min  OT Assessment / Plan / Recommendation Clinical Impression  Pt is s/p THA and all education provided on posterior precautions and ADL. Pt/spouse feel comfortable with all tasks and do not feel they need further OT. Feel pt will progress with functional transfers with PT. Pt is hoping to discharge today.    OT Assessment  Patient does not need any further OT services    Follow Up Recommendations  No OT follow up;Supervision/Assistance - 24 hour    Barriers to Discharge      Equipment Recommendations  None recommended by PT;None recommended by OT    Recommendations for Other Services    Frequency       Precautions / Restrictions Precautions Precautions: Posterior Hip Precaution Comments: reviewed THPs with pt/spouse Restrictions Weight Bearing Restrictions: No        ADL  Eating/Feeding: Simulated;Independent Where Assessed - Eating/Feeding: Chair Grooming: Simulated;Set up Where Assessed - Grooming: Supported sitting Upper Body Bathing: Simulated;Chest;Right arm;Left arm;Abdomen;Set up Where Assessed - Upper Body Bathing: Unsupported sitting Lower Body Bathing: Simulated;Moderate assistance;Other (comment) (without AE) Where Assessed - Lower Body Bathing: Supported sit to stand Upper Body Dressing: Simulated;Set up Where Assessed - Upper Body Dressing: Unsupported sitting Lower Body Dressing: Simulated;Moderate assistance Where Assessed - Lower Body Dressing: Supported sit to Pharmacist, hospital: Performed;Minimal Dentist Method: Other (comment) (ambulating) Toilet Transfer Equipment: Raised toilet seat with arms (or 3-in-1 over toilet) Toileting - Clothing Manipulation and Hygiene: Simulated;Minimal assistance Where Assessed - Engineer, mining and  Hygiene: Standing Tub/Shower Transfer Method: Not assessed Equipment Used: Rolling walker ADL Comments: verbally reviewed AE but spouse and pt do not feel they need to practice hands on with AE. Pt familiar with it from previous surgeries. Pt practiced simulated shower transfer and did pretty well. Recommended pt sponge bathe one additional day until she showers (depending on when cleared by MD to shower also) to gain alittle more strength to step in and out of shower. Noted one time difficulty picking up R foot when walking/stepping but then able to do fine after that. Reviewed all THPs. Pt able to state 2/3 on her own before reviewing them.     OT Diagnosis:    OT Problem List:   OT Treatment Interventions:     OT Goals    Visit Information  Last OT Received On: 09/10/11 Assistance Needed: +1    Subjective Data  Subjective: I have all the adaptive equipment Patient Stated Goal: home   Prior Functioning  Vision/Perception  Home Living Lives With: Spouse Available Help at Discharge: Family Type of Home: House Home Access: Stairs to enter [employer] of Steps: 1 (1 + 1) Entrance Stairs-Rails: None Home Layout: One level Bathroom Shower/Tub: Health visitor: Standard Home Adaptive Equipment: Walker - standard;Bedside commode/3-in-1;Long-handled shoehorn;Long-handled sponge;Reacher;Sock aid Prior Function Level of Independence: Independent Able to Take Stairs?: Yes Driving: Yes Communication Communication: No difficulties      Cognition  Overall Cognitive Status: Appears within functional limits for tasks assessed/performed Arousal/Alertness: Awake/alert Orientation Level: Appears intact for tasks assessed Behavior During Session: [facility] for tasks performed    Extremity/Trunk Assessment Right Upper Extremity Assessment RUE ROM/Strength/Tone: [facility] for tasks assessed Left Upper Extremity Assessment LUE ROM/Strength/Tone: WFL for tasks assessed     Mobility Transfers Transfers: Sit to Stand;Stand to Sit Sit to Stand: 4: Min  assist;With upper extremity assist;From chair/3-in-1 Stand to Sit: 4: Min assist;With upper extremity assist;To chair/3-in-1 Details for Transfer Assistance: min verbal cues for hand placement and R LE forward   Exercise    Balance Balance Balance Assessed: Yes Dynamic Standing Balance Dynamic Standing - Balance Support: No upper extremity supported Dynamic Standing - Level of Assistance: 4: Min assist  End of Session OT - End of Session Activity Tolerance: Patient tolerated treatment well Patient left: in chair;with call bell/phone within reach;with family/visitor present  GO     Lennox Laity [phone] 09/10/2011, 11:45 AM

## 2011-09-11 LAB — CBC
HCT: 25.4 % — ABNORMAL LOW (ref 36.0–46.0)
MCHC: 35 g/dL (ref 30.0–36.0)
Platelets: 203 10*3/uL (ref 150–400)
RDW: 12.8 % (ref 11.5–15.5)
WBC: 8.5 10*3/uL (ref 4.0–10.5)

## 2011-09-11 LAB — BASIC METABOLIC PANEL
Chloride: 102 mEq/L (ref 96–112)
GFR calc Af Amer: >90 mL/min (ref >90)
GFR calc non Af Amer: >90 mL/min (ref >90)
Potassium: 3.4 mEq/L — ABNORMAL LOW (ref 3.5–5.1)
Sodium: 137 mEq/L (ref 135–145)

## 2011-09-11 MED ORDER — OXYCODONE HCL 5 MG PO TABS: 5.0000 mg | ORAL_TABLET | ORAL | Status: AC | PRN

## 2011-09-11 MED ORDER — POLYSACCHARIDE IRON COMPLEX 150 MG PO CAPS
150.0000 mg | ORAL_CAPSULE | Freq: Two times a day (BID) | ORAL | Status: DC
  Filled 2011-09-11 (×2): qty 1

## 2011-09-11 MED ORDER — OXYCODONE HCL 5 MG PO TABS: 5.0000 mg | ORAL_TABLET | ORAL | Status: DC | PRN

## 2011-09-11 MED ORDER — RIVAROXABAN 10 MG PO TABS: 10.0000 mg | ORAL_TABLET | Freq: Every day | ORAL | Status: DC

## 2011-09-11 MED ORDER — METHOCARBAMOL 500 MG PO TABS: 500.0000 mg | ORAL_TABLET | Freq: Four times a day (QID) | ORAL | Status: DC | PRN

## 2011-09-11 MED ORDER — POLYSACCHARIDE IRON COMPLEX 150 MG PO CAPS: 150.0000 mg | ORAL_CAPSULE | Freq: Two times a day (BID) | ORAL | Status: DC

## 2011-09-11 MED ORDER — METHOCARBAMOL 500 MG PO TABS: 500.0000 mg | ORAL_TABLET | Freq: Four times a day (QID) | ORAL | Status: AC | PRN

## 2011-09-11 NOTE — Progress Notes (Signed)
Pt stable, scripts, d/c instructions given with no questions/concerns voiced by patient or husband.  Pt transported via wheelchair to private vehicle with NT and husband.

## 2011-09-11 NOTE — Progress Notes (Addendum)
   Subjective: 3 Days Post-Op Procedure(s) (LRB): TOTAL HIP ARTHROPLASTY (Right) Patient reports pain as mild.   Patient seen in rounds with Dr. Lequita Halt. Husband in room. Patient is well, and has had no acute complaints or problems Patient is ready to go home today.  Objective: Vital signs in last 24 hours: Temp:  [98.6 F (37 C)-98.7 F (37.1 C)] 98.6 F (37 C) (07/25 0607) Pulse Rate:  [89-103] 103  (07/25 0607) Resp:  [16] 16  (07/25 0800) BP: (131-137)/(65-72) 137/65 mmHg (07/25 0607) SpO2:  [98 %-100 %] 100 % (07/25 0800)  Intake/Output from previous day:  Intake/Output Summary (Last 24 hours) at 09/11/11 0900 Last data filed at 09/11/11 0813  Gross per 24 hour  Intake   1020 ml  Output   1300 ml  Net   -280 ml    Intake/Output this shift: Total I/O In: 240 [P.O.:240] Out: -   Labs:  Basename 09/11/11 0427 09/10/11 0417 09/09/11 0420  HGB 8.9* 10.1* 10.7*    Basename 09/11/11 0427 09/10/11 0417  WBC 8.5 10.7*  RBC 2.91* 3.26*  HCT 25.4* 28.6*  PLT 203 206    Basename 09/11/11 0427 09/10/11 0417  NA 137 134*  K 3.4* 3.7  CL 102 102  CO2 26 24  BUN 11 9  CREATININE 0.59 0.54  GLUCOSE 97 120*  CALCIUM 8.2* 8.2*   No results found for this basename: LABPT:2,INR:2 in the last 72 hours  EXAM: General - Patient is Alert, Appropriate and Oriented Extremity - Neurovascular intact Sensation intact distally Dorsiflexion/Plantar flexion intact No cellulitis present Incision - clean, dry, healing Motor Function - intact, moving foot and toes well on exam.   Assessment/Plan: 3 Days Post-Op Procedure(s) (LRB): TOTAL HIP ARTHROPLASTY (Right) Procedure(s) (LRB): TOTAL HIP ARTHROPLASTY (Right) Past Medical History  Diagnosis Date  . Hypertension   . Hypothyroidism   . Arthritis    Principal Problem:  *OA (osteoarthritis) of hip Active Problems:  Postop Hyponatremia   Discharge home with home health Diet - Cardiac diet Follow up - in 2  weeks Activity - WBAT Disposition - Home Condition Upon Discharge - Good D/C Meds - See DC Summary DVT Prophylaxis - Xarelto  PERKINS, ALEXZANDREW 09/11/2011, 9:00 AM  Addendum- Pt has post-op blood loss anemia. Asymptomatic. Will send home on iron supplements.

## 2011-09-11 NOTE — Progress Notes (Signed)
Physical Therapy Treatment Patient Details Name: [name] MRN: [id number] DOB: Feb 08, 1946 Today's Date: 09/11/2011 Time: 4098-1191 PT Time Calculation (min): 33 min  PT Assessment / Plan / Recommendation Comments on Treatment Session  Reviewed car transfers with pt and spouse as well as up/down stairs with husband participating    Follow Up Recommendations  Home health PT    Barriers to Discharge        Equipment Recommendations  None recommended by PT;None recommended by OT    Recommendations for Other Services OT consult  Frequency 7X/week   Plan Discharge plan remains appropriate    Precautions / Restrictions Precautions Precautions: Posterior Hip Precaution Comments: pt recalls all THp Restrictions Weight Bearing Restrictions: No Other Position/Activity Restrictions: WBAT   Pertinent Vitals/Pain Min c/o pain    Mobility  Bed Mobility Bed Mobility: Supine to Sit;Sit to Supine Supine to Sit: 4: Min assist Sit to Supine: 4: Min assist Details for Bed Mobility Assistance: cues for sequence and use of UEs, assist for R LE management and to bring trunk to erect Transfers Transfers: Sit to Stand;Stand to Sit Sit to Stand: 4: Min guard Stand to Sit: 4: Min guard Details for Transfer Assistance: min verbal cues for hand placement and R LE forward Ambulation/Gait Ambulation/Gait Assistance: 4: Min guard;5: Supervision Ambulation Distance (Feet): 175 Feet Assistive device: Rolling walker Ambulation/Gait Assistance Details: cues for posture, to increase L LE stride length, and for decreased R circumduction to advance R LE Gait Pattern: Step-to pattern;Decreased step length - left;Decreased stance time - right Stairs: Yes Stairs Assistance: 4: Min assist Stairs Assistance Details (indicate cue type and reason): cues fro sequence and for foot placement Stair Management Technique: No rails;Backwards;With walker Number of Stairs: 1     Exercises Total Joint  Exercises Ankle Circles/Pumps: AROM;20 reps;Supine;Both Gluteal Sets: AROM;Both;Supine;20 reps Short Arc Quad: AROM;AAROM;20 reps;Supine;Both Heel Slides: AAROM;20 reps;Supine;Right Hip ABduction/ADduction: AAROM;20 reps;Supine;Both   PT Diagnosis:    PT Problem List:   PT Treatment Interventions:     PT Goals Acute Rehab PT Goals PT Goal Formulation: With patient Time For Goal Achievement: 09/16/11 Potential to Achieve Goals: Good Pt will go Supine/Side to Sit: with supervision PT Goal: Supine/Side to Sit - Progress: Progressing toward goal Pt will go Sit to Supine/Side: with supervision PT Goal: Sit to Supine/Side - Progress: Progressing toward goal Pt will go Sit to Stand: with supervision PT Goal: Sit to Stand - Progress: Progressing toward goal Pt will go Stand to Sit: with supervision PT Goal: Stand to Sit - Progress: Progressing toward goal Pt will Ambulate: 51 - 150 feet;with supervision;with rolling walker PT Goal: Ambulate - Progress: Progressing toward goal Pt will Go Up / Down Stairs: 1-2 stairs;with min assist;with rolling walker PT Goal: Up/Down Stairs - Progress: Met  Visit Information  Last PT Received On: 09/11/11 Assistance Needed: +1    Subjective Data  Subjective: I am ready to go home Patient Stated Goal: Resume previous lifestyle with decreased pain   Cognition  Overall Cognitive Status: Appears within functional limits for tasks assessed/performed Arousal/Alertness: Awake/alert Orientation Level: Appears intact for tasks assessed Behavior During Session: [facility] for tasks performed    Balance     End of Session PT - End of Session Activity Tolerance: Patient tolerated treatment well Patient left: in bed;with call bell/phone within reach;with family/visitor present Nurse Communication: Mobility status   GP     Thekla Colborn 09/11/2011, 9:21 AM

## 2011-09-15 NOTE — Discharge Summary (Signed)
Physician Discharge Summary   Patient ID: [name] MRN: [id number] DOB/AGE: 1945/09/15 66 y.o.  Admit date: 09/08/2011 Discharge date: 09/11/2011  Primary Diagnosis: Osteoarthritis Right hip   Admission Diagnoses:  Past Medical History  Diagnosis Date  . Hypertension   . Hypothyroidism   . Arthritis    Discharge Diagnoses:   Principal Problem:  *OA (osteoarthritis) of hip Active Problems:  Postop Hyponatremia  Procedure: Procedure(s) (LRB): TOTAL HIP ARTHROPLASTY (Right)   Consults: None  HPI: [name] is a 66 y.o. female with end stage arthritis of her right hip with progressively worsening pain and dysfunction. Pain occurs with activity and rest including pain at night. She has tried analgesics, protected weight bearing and rest without benefit. Pain is too severe to attempt physical therapy. Radiographs demonstrate bone on bone arthritis with subchondral cyst formation. She presents now for right THA.      Laboratory Data: Hospital Outpatient Visit on 09/03/2011  Component Date Value Range Status  . aPTT 09/03/2011 30  24 - 37 seconds Final  . Prothrombin Time 09/03/2011 13.0  11.6 - 15.2 seconds Final  . INR 09/03/2011 0.96  0.00 - 1.49 Final  . MRSA, PCR 09/03/2011 NEGATIVE  NEGATIVE Final  . Staphylococcus aureus 09/03/2011 NEGATIVE  NEGATIVE Final   Comment:                                 The Xpert SA Assay (FDA                          approved for NASAL specimens                          only), is one component of                          a comprehensive surveillance                          program.  It is not intended                          to diagnose infection nor to                          guide or monitor treatment.   No results found for this basename: HGB:5 in the last 72 hours No results found for this basename: WBC:2,RBC:2,HCT:2,PLT:2 in the last 72 hours No results found for this basename:  NA:2,K:2,CL:2,CO2:2,BUN:2,CREATININE:2,GLUCOSE:2,CALCIUM:2 in the last 72 hours No results found for this basename: LABPT:2,INR:2 in the last 72 hours  X-Rays:Dg Chest 2 View  09/03/2011  *RADIOLOGY REPORT*  Clinical Data: Assessment for right total hip replacement, history hypertension  CHEST - 2 VIEW  Comparison: None  Findings: Upper-normal size of cardiac silhouette. Mediastinal contours and pulmonary vascularity normal. Lungs clear. No pleural effusion or pneumothorax. No acute osseous findings.  IMPRESSION: No acute abnormalities.  Original Report Authenticated By: Lollie Marrow, M.D.   Dg Hip Complete Right  09/03/2011  *RADIOLOGY REPORT*  Clinical Data: Preoperative films. Patient for right hip replacement.  RIGHT HIP - COMPLETE 2+ VIEW  Comparison: None.  Findings: There is advanced right hip osteoarthritis with near bone- on-bone joint space  narrowing.  Subchondral sclerosis and cyst formation are identified.  There is no fracture or dislocation. The left total hip replacement is noted.  Soft tissue structures are unremarkable.  IMPRESSION:  1.  Advanced right hip osteoarthritis without acute abnormality. 2.  Left total hip replacement.  Original Report Authenticated By: Bernadene Bell. Maricela Curet, M.D.   Dg Pelvis Portable  09/08/2011  *RADIOLOGY REPORT*  Clinical Data: Postop right hip surgery.  PORTABLE PELVIS,PORTABLE RIGHT HIP - 1 VIEW  Comparison: 09/03/2011.  Findings: Total right hip prosthesis appears in satisfactory position on this frontal projection.  There is a lucency between the native acetabulum and the acetabular component which may be expected.  Surgical drain is in place.  Remote left hip replacement.  IMPRESSION: Satisfactory positioning of a right total hip prosthesis.  Please see above discussion.  Original Report Authenticated By: Fuller Canada, M.D.   Dg Hip Portable 1 View Right  09/08/2011  *RADIOLOGY REPORT*  Clinical Data: Postop right hip surgery.  PORTABLE  PELVIS,PORTABLE RIGHT HIP - 1 VIEW  Comparison: 09/03/2011.  Findings: Total right hip prosthesis appears in satisfactory position on this frontal projection.  There is a lucency between the native acetabulum and the acetabular component which may be expected.  Surgical drain is in place.  Remote left hip replacement.  IMPRESSION: Satisfactory positioning of a right total hip prosthesis.  Please see above discussion.  Original Report Authenticated By: Fuller Canada, M.D.    EKG:No orders found for this or any previous visit.   Hospital Course: Patient was admitted to [facility] and taken to the OR and underwent the above state procedure without complications.  Patient tolerated the procedure well and was later transferred to the recovery room and then to the orthopaedic floor for postoperative care.  They were given PO and IV analgesics for pain control following their surgery.  They were given 24 hours of postoperative antibiotics and started on DVT prophylaxis in the form of Xarelto.   PT and OT were ordered for total hip protocol.  The patient was allowed to be WBAT with therapy. Discharge planning was consulted to help with postop disposition and equipment needs.  Patient had a decent night on the evening of surgery and started to get up OOB with therapy on day one.  Hemovac drain was pulled without difficulty.  The knee immobilizer was removed and discontinued.  Continued to work with therapy into day two.  Dressing was changed on day two and the incision was healing well.  By day three, the patient had progressed with therapy and meeting their goals.  Incision was healing well.  Patient was seen in rounds and was ready to go home.  Discharge Medications: Prior to Admission medications   Medication Sig Start Date End Date Taking? Authorizing Provider  acetaminophen (TYLENOL) 500 MG tablet Take 500 mg by mouth every 6 (six) hours as needed.   Yes Historical Provider, MD  levothyroxine  (SYNTHROID, LEVOTHROID) 137 MCG tablet Take 137 mcg by mouth every morning.    Yes Historical Provider, MD  lisinopril-hydrochlorothiazide (PRINZIDE,ZESTORETIC) 20-12.5 MG per tablet Take 1 tablet by mouth daily with breakfast.   Yes Historical Provider, MD  traMADol (ULTRAM) 50 MG tablet Take 50 mg by mouth every 6 (six) hours as needed. pain   Yes Historical Provider, MD  iron polysaccharides (NIFEREX) 150 MG capsule Take 1 capsule (150 mg total) by mouth 2 (two) times daily. 09/11/11 09/10/12  Alexzandrew Perkins, PA  methocarbamol (ROBAXIN) 500  MG tablet Take 1 tablet (500 mg total) by mouth every 6 (six) hours as needed. 09/11/11 09/21/11  Alexzandrew Perkins, PA  oxyCODONE (OXY IR/ROXICODONE) 5 MG immediate release tablet Take 1-2 tablets (5-10 mg total) by mouth every 4 (four) hours as needed for pain. 09/11/11 09/21/11  Alexzandrew Julien Girt, PA  rivaroxaban (XARELTO) 10 MG TABS tablet Take 1 tablet (10 mg total) by mouth daily with breakfast. Take Xarelto for two and a half more weeks, then discontinue Xarelto. Once the patient has completed the Xarelto, they may resume the 81 mg Aspirin. 09/11/11   Alexzandrew Julien Girt, PA    Diet: Cardiac diet Activity:WBAT No bending hip over 90 degrees- A "L" Angle Do not cross legs Do not let foot roll inward When turning these patients a pillow should be placed between the patient's legs to prevent crossing. Patients should have the affected knee fully extended when trying to sit or stand from all surfaces to prevent excessive hip flexion. When ambulating and turning toward the affected side the affected leg should have the toes turned out prior to moving the walker and the rest of patient's body as to prevent internal rotation/ turning in of the leg. Abduction pillows are the most effective way to prevent a patient from not crossing legs or turning toes in at rest. If an abduction pillow is not ordered placing a regular pillow length wise between the patient's  legs is also an effective reminder. It is imperative that these precautions be maintained so that the surgical hip does not dislocate. Follow-up:in 2 weeks Disposition - Home Discharged Condition: good   Discharge Orders    Future Orders Please Complete By Expires   Diet - low sodium heart healthy      Call MD / Call 911      Comments:   If you experience chest pain or shortness of breath, CALL 911 and be transported to the hospital emergency room.  If you develope a fever above 101 F, pus (white drainage) or increased drainage or redness at the wound, or calf pain, call your surgeon's office.   Discharge instructions      Comments:   Pick up stool softner and laxative for home. Do not submerge incision under water. May shower. Continue to use ice for pain and swelling from surgery. Hip precautions.  Total Hip Protocol.  Take Xarelto for two and a half more weeks, then discontinue Xarelto. Once the patient has completed the Xarelto, they may resume the 81 mg Aspirin.   Constipation Prevention      Comments:   Drink plenty of fluids.  Prune juice may be helpful.  You may use a stool softener, such as Colace (over the counter) 100 mg twice a day.  Use MiraLax (over the counter) for constipation as needed.   Increase activity slowly as tolerated      Patient may shower      Comments:   You may shower without a dressing once there is no drainage.  Do not wash over the wound.  If drainage remains, do not shower until drainage stops.   Driving restrictions      Comments:   No driving until released by the physician.   Lifting restrictions      Comments:   No lifting until released by the physician.   Follow the hip precautions as taught in Physical Therapy      Change dressing      Comments:   You may change your dressing  dressing daily with sterile 4 x 4 inch gauze dressing and paper tape.  Do not submerge the incision under water.   TED hose      Comments:   Use stockings (TED  hose) for 3 weeks on both leg(s).  You may remove them at night for sleeping.   Do not sit on low chairs, stoools or toilet seats, as it may be difficult to get up from low surfaces        Medication List  As of 09/15/2011  5:47 PM   STOP taking these medications         aspirin EC 81 MG tablet      CALTRATE 600+D PO      cholecalciferol 1000 UNITS tablet      co-enzyme Q-10 30 MG capsule      estradiol 0.1 MG/GM vaginal cream      estrogen-methylTESTOSTERone 0.625-1.25 MG per tablet      Fish Oil 1000 MG Caps      Magnesium 250 MG Tabs      multivitamin with minerals Tabs      norethindrone 5 MG tablet      OSTEO BI-FLEX ADV DOUBLE ST PO      Red Yeast Rice 600 MG Tabs         TAKE these medications         acetaminophen 500 MG tablet   Commonly known as: TYLENOL   Take 500 mg by mouth every 6 (six) hours as needed.      iron polysaccharides 150 MG capsule   Commonly known as: NIFEREX   Take 1 capsule (150 mg total) by mouth 2 (two) times daily.      levothyroxine 137 MCG tablet   Commonly known as: SYNTHROID, LEVOTHROID   Take 137 mcg by mouth every morning.      lisinopril-hydrochlorothiazide 20-12.5 MG per tablet   Commonly known as: PRINZIDE,ZESTORETIC   Take 1 tablet by mouth daily with breakfast.      methocarbamol 500 MG tablet   Commonly known as: ROBAXIN   Take 1 tablet (500 mg total) by mouth every 6 (six) hours as needed.      oxyCODONE 5 MG immediate release tablet   Commonly known as: Oxy IR/ROXICODONE   Take 1-2 tablets (5-10 mg total) by mouth every 4 (four) hours as needed for pain.      rivaroxaban 10 MG Tabs tablet   Commonly known as: XARELTO   Take 1 tablet (10 mg total) by mouth daily with breakfast. Take Xarelto for two and a half more weeks, then discontinue Xarelto.  Once the patient has completed the Xarelto, they may resume the 81 mg Aspirin.      traMADol 50 MG tablet   Commonly known as: ULTRAM   Take 50 mg by mouth every  6 (six) hours as needed. pain           Follow-up Information    Follow up with Loanne Drilling, MD. Schedule an appointment as soon as possible for a visit in 2 weeks.   Contact information:   [facility] [address], [address] [place] [address] [phone]          Signed: Patrica Duel 09/15/2011, 5:47 PM

## 2012-06-24 ENCOUNTER — Other Ambulatory Visit: Payer: Self-pay

## 2012-06-24 DIAGNOSIS — Z1231 Encounter for screening mammogram for malignant neoplasm of breast: Secondary | ICD-10-CM

## 2012-08-02 ENCOUNTER — Ambulatory Visit: Payer: Medicare Other

## 2012-08-30 ENCOUNTER — Ambulatory Visit
Admission: RE | Admit: 2012-08-30 | Discharge: 2012-08-30 | Disposition: A | Discharge status: Home / Self Care | Payer: Medicare Other | Source: Ambulatory Visit

## 2012-08-30 DIAGNOSIS — Z1231 Encounter for screening mammogram for malignant neoplasm of breast: Secondary | ICD-10-CM

## 2012-09-01 ENCOUNTER — Other Ambulatory Visit: Payer: Self-pay | Admitting: Internal Medicine

## 2012-09-01 DIAGNOSIS — R928 Other abnormal and inconclusive findings on diagnostic imaging of breast: Secondary | ICD-10-CM

## 2012-09-14 ENCOUNTER — Ambulatory Visit
Admission: RE | Admit: 2012-09-14 | Discharge: 2012-09-14 | Disposition: A | Discharge status: Home / Self Care | Payer: Medicare Other | Source: Ambulatory Visit | Attending: Internal Medicine | Admitting: Internal Medicine

## 2012-09-14 DIAGNOSIS — R928 Other abnormal and inconclusive findings on diagnostic imaging of breast: Secondary | ICD-10-CM

## 2013-03-17 IMAGING — MG MM DIGITAL DIAGNOSTIC LIMITED BILAT
4 series · 4 of 4 positions shown · non-contrast
Comparison: 07/11/2010, 07/21/2011

CLINICAL DATA: Further evaluation of bilateral asymmetries

DIGITAL DIAGNOSTIC BILATERAL MAMMOGRAM

[L CC]
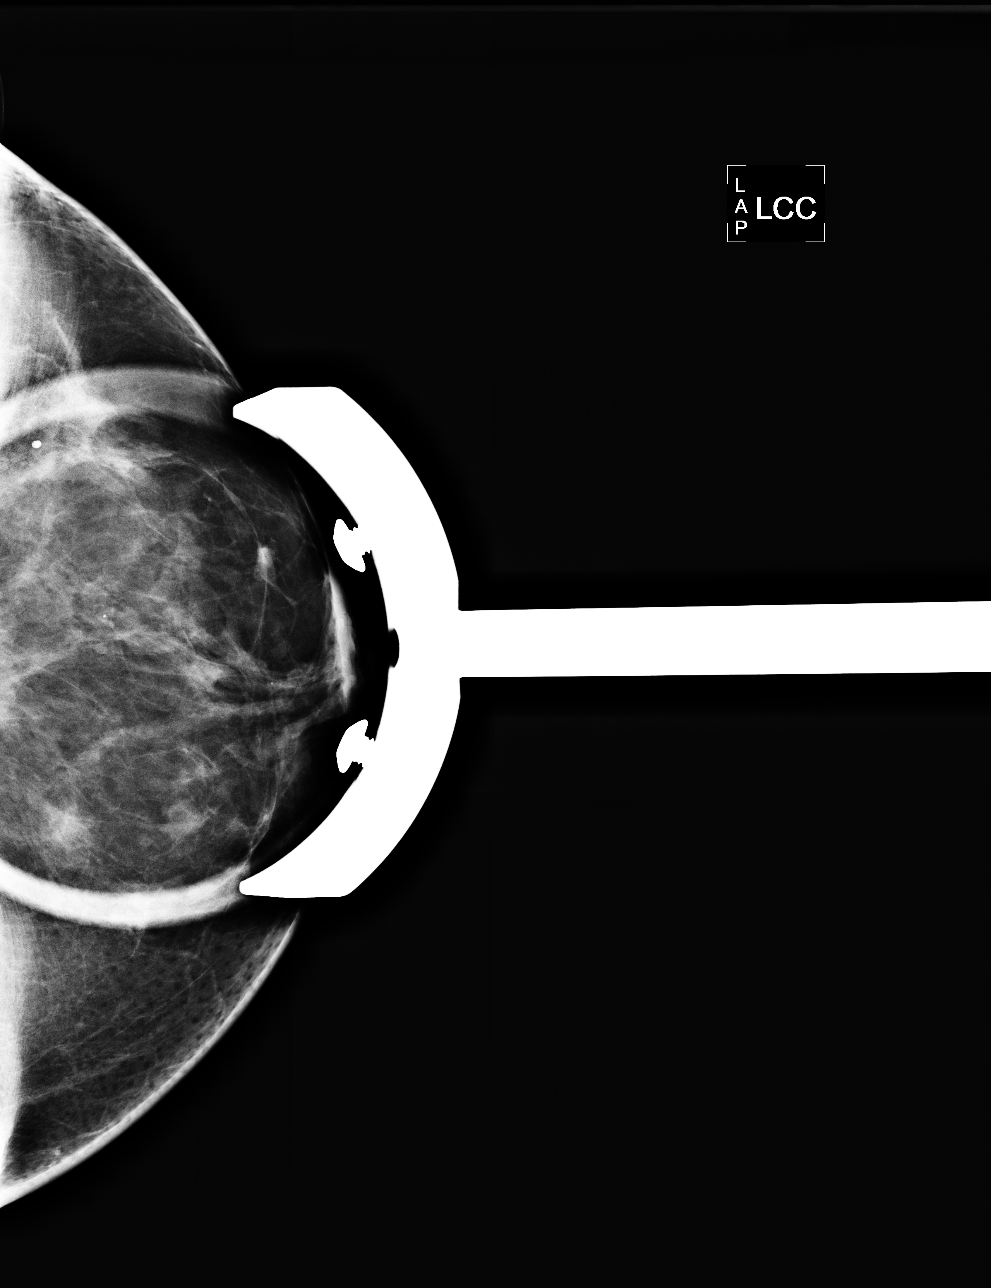

[L ML]
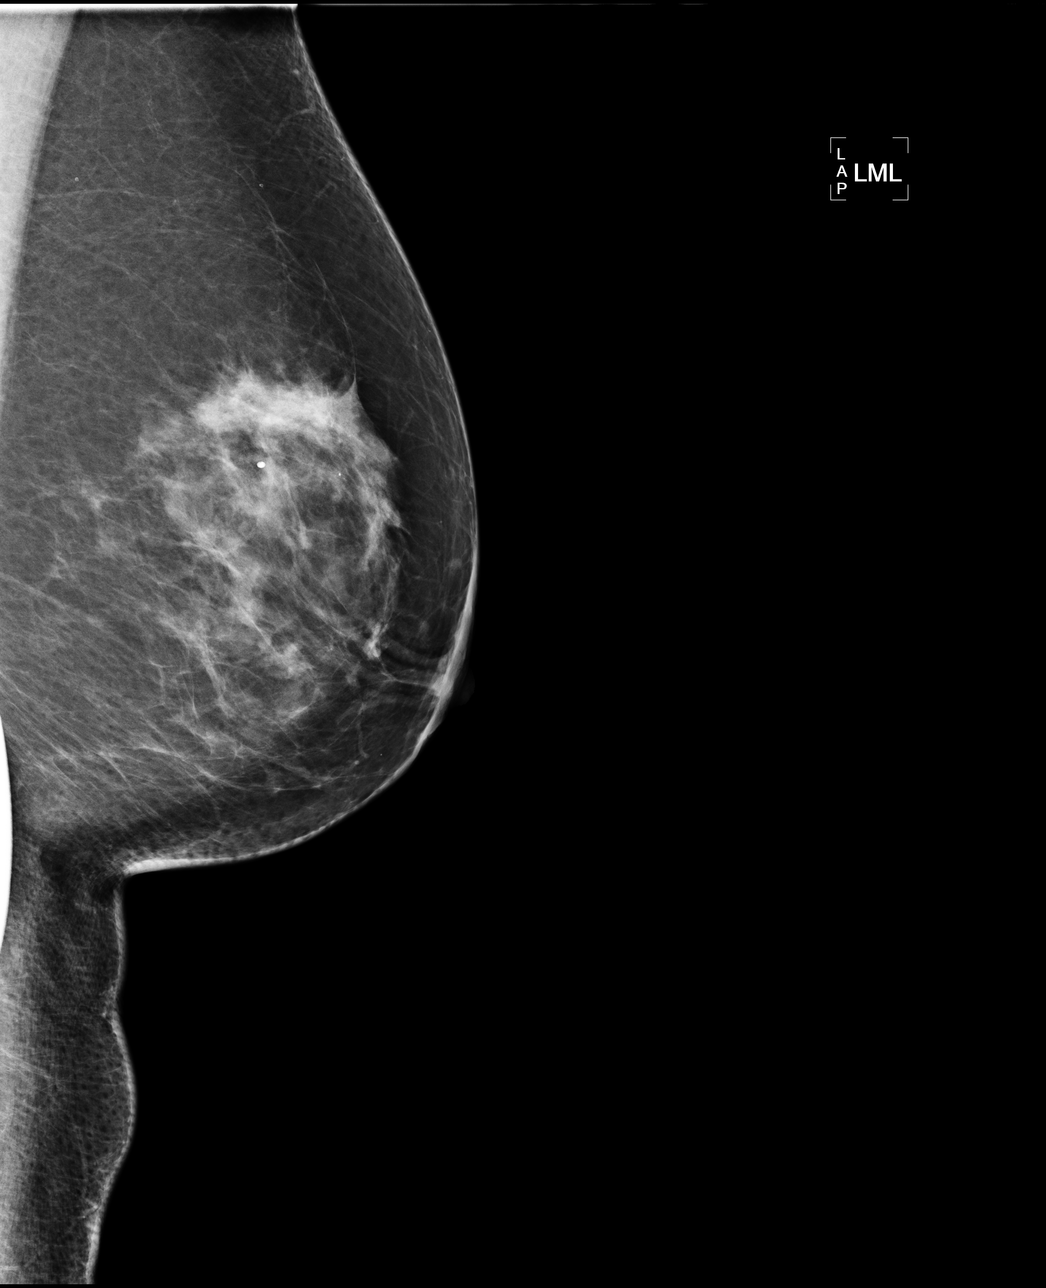

[R CC]
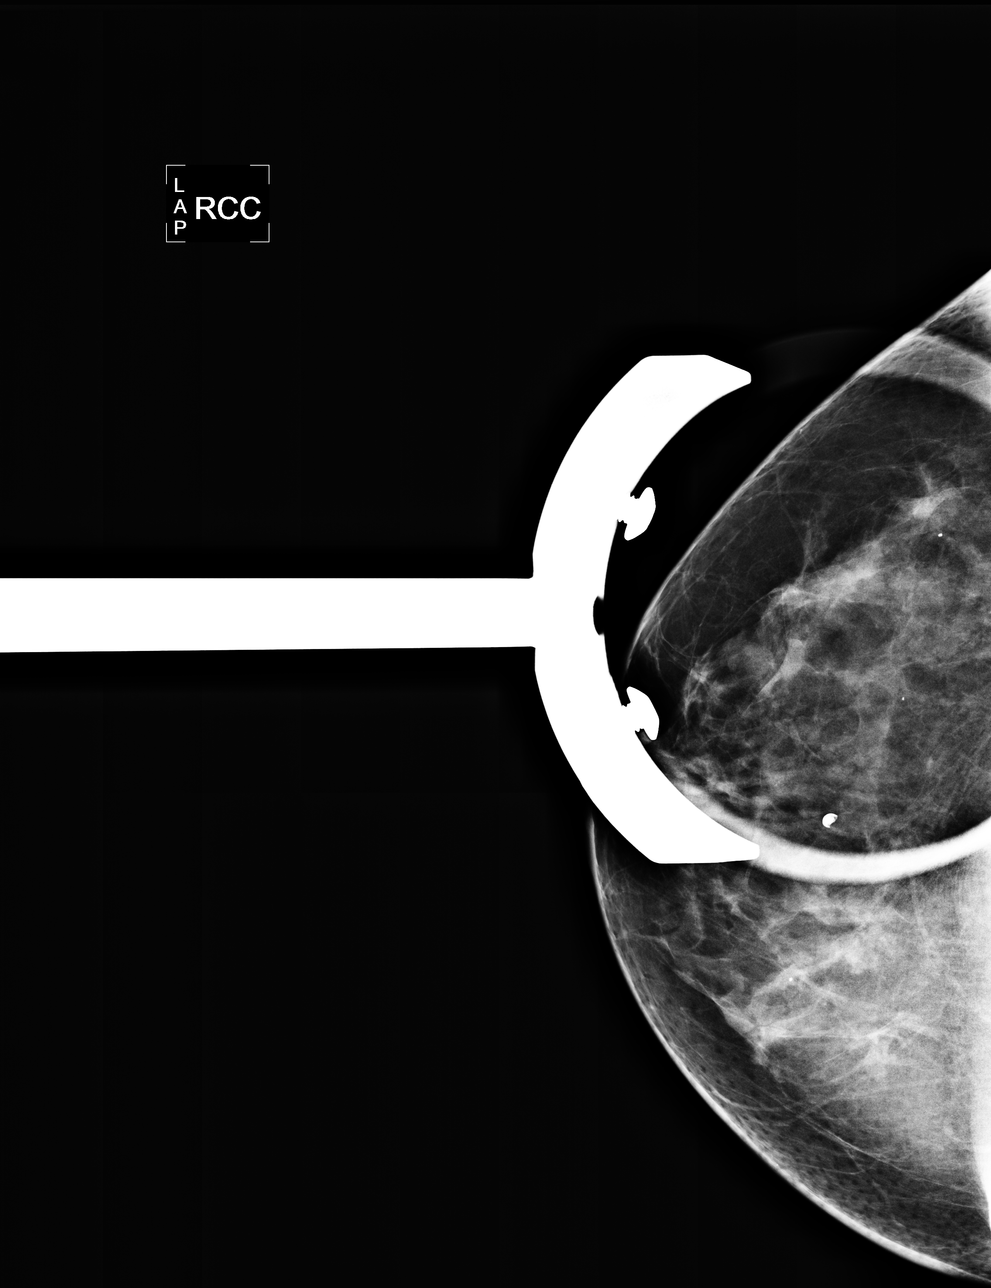

[R MLO]
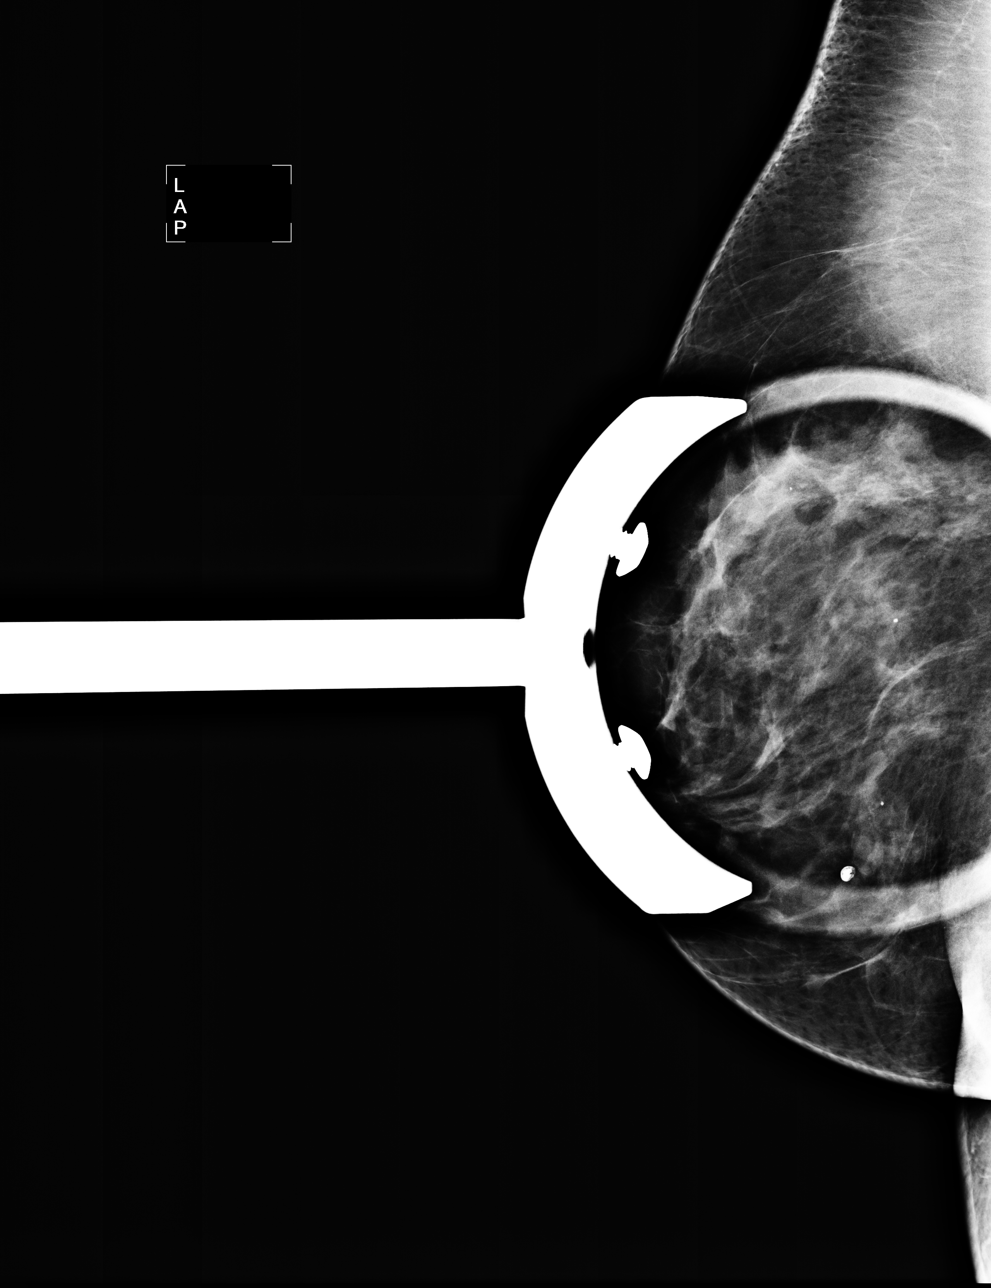

[4 of 4 positions shown; findings below may reference images not displayed]

FINDINGS: No persistent abnormalities on spot compression views
IMPRESSION: Negative

BI-RADS CATEGORY 1:  Negative.

RECOMMENDATION:

Return to annual screening mammography

## 2013-06-15 ENCOUNTER — Other Ambulatory Visit: Payer: Self-pay

## 2013-06-15 DIAGNOSIS — Z1231 Encounter for screening mammogram for malignant neoplasm of breast: Secondary | ICD-10-CM

## 2013-08-31 ENCOUNTER — Ambulatory Visit: Payer: Medicare Other

## 2013-10-19 ENCOUNTER — Ambulatory Visit
Admission: RE | Admit: 2013-10-19 | Discharge: 2013-10-19 | Disposition: A | Discharge status: Home / Self Care | Payer: Medicare Other | Source: Ambulatory Visit

## 2013-10-19 DIAGNOSIS — Z1231 Encounter for screening mammogram for malignant neoplasm of breast: Secondary | ICD-10-CM

## 2014-09-15 ENCOUNTER — Other Ambulatory Visit: Payer: Self-pay

## 2014-09-15 DIAGNOSIS — Z1231 Encounter for screening mammogram for malignant neoplasm of breast: Secondary | ICD-10-CM

## 2014-10-26 ENCOUNTER — Ambulatory Visit
Admission: RE | Admit: 2014-10-26 | Discharge: 2014-10-26 | Disposition: A | Discharge status: Home / Self Care | Payer: Medicare Other | Source: Ambulatory Visit

## 2014-10-26 DIAGNOSIS — Z1231 Encounter for screening mammogram for malignant neoplasm of breast: Secondary | ICD-10-CM

## 2014-12-12 ENCOUNTER — Ambulatory Visit: Payer: Self-pay | Admitting: Physician Assistant

## 2014-12-20 ENCOUNTER — Encounter (HOSPITAL_COMMUNITY): Payer: Self-pay

## 2014-12-20 ENCOUNTER — Encounter (HOSPITAL_COMMUNITY)
Admission: RE | Admit: 2014-12-20 | Discharge: 2014-12-20 | Disposition: A | Discharge status: Home / Self Care | Payer: Medicare Other | Source: Ambulatory Visit | Attending: Orthopedic Surgery | Admitting: Orthopedic Surgery

## 2014-12-20 DIAGNOSIS — Z0183 Encounter for blood typing: Secondary | ICD-10-CM | POA: Diagnosis not present

## 2014-12-20 DIAGNOSIS — Z01818 Encounter for other preprocedural examination: Secondary | ICD-10-CM | POA: Insufficient documentation

## 2014-12-20 DIAGNOSIS — Z01812 Encounter for preprocedural laboratory examination: Secondary | ICD-10-CM | POA: Diagnosis not present

## 2014-12-20 DIAGNOSIS — M47898 Other spondylosis, sacral and sacrococcygeal region: Secondary | ICD-10-CM | POA: Insufficient documentation

## 2014-12-20 HISTORY — DX: Pneumonia, unspecified organism: J18.9

## 2014-12-20 LAB — CBC
HCT: 43.2 % (ref 36.0–46.0)
HEMOGLOBIN: 15 g/dL (ref 12.0–15.0)
MCH: 30.3 pg (ref 26.0–34.0)
MCHC: 34.7 g/dL (ref 30.0–36.0)
MCV: 87.3 fL (ref 78.0–100.0)
PLATELETS: 201 10*3/uL (ref 150–400)
RBC: 4.95 MIL/uL (ref 3.87–5.11)
RDW: 12 % (ref 11.5–15.5)
WBC: 4.9 10*3/uL (ref 4.0–10.5)

## 2014-12-20 LAB — TYPE AND SCREEN
ABO/RH(D): A POS
Antibody Screen: NEGATIVE

## 2014-12-20 LAB — ABO/RH: ABO/RH(D): A POS

## 2014-12-20 LAB — SURGICAL PCR SCREEN
MRSA, PCR: NEGATIVE
Staphylococcus aureus: NEGATIVE

## 2014-12-20 LAB — BASIC METABOLIC PANEL
ANION GAP: 13 (ref 5–15)
BUN: 9 mg/dL (ref 6–20)
CHLORIDE: 100 mmol/L — AB (ref 101–111)
CO2: 27 mmol/L (ref 22–32)
Calcium: 10 mg/dL (ref 8.9–10.3)
Creatinine, Ser: 0.78 mg/dL (ref 0.44–1.00)
GFR calc Af Amer: >60 mL/min (ref >60)
GLUCOSE: 101 mg/dL — AB (ref 65–99)
Potassium: 3.9 mmol/L (ref 3.5–5.1)
Sodium: 140 mmol/L (ref 135–145)

## 2014-12-20 NOTE — Pre-Procedure Instructions (Addendum)
[name]  12/20/2014      [facility] [place] - [address] [place] [address] Phone: [phone] Fax: [phone]  [facility] [place], [place] [address] Phone: [phone] Fax: [phone]    Your procedure is scheduled on 12/27/14  Report to [facility] short stay admitting at 630 A.M.  Call this number if you have problems the morning of surgery:  [phone]   Remember:  Do not eat food or drink liquids after midnight.  Take these medicines the morning of surgery with A SIP OF WATER gabapentin, levothyroxine, oxycodone if needed  STOP all herbel meds, nsaids (aleve,naproxen,advil,ibuprofen) 5 days prior to surgery starting tomorrow 12/21/14 including aspirin,cal-vit D, coq10, ibuprofen, krill oil, milk thistle, red yeast rice ext, multi vit   Do not wear jewelry, make-up or nail polish.  Do not wear lotions, powders, or perfumes.  You may wear deodorant.  Do not shave 48 hours prior to surgery.  Men may shave face and neck.  Do not bring valuables to the hospital.  [facility] is not responsible for any belongings or valuables.  Contacts, dentures or bridgework may not be worn into surgery.  Leave your suitcase in the car.  After surgery it may be brought to your room.  For patients admitted to the hospital, discharge time will be determined by your treatment team.  Patients discharged the day of surgery will not be allowed to drive home.   Name and phone number of your driver:    Special instructions:   Special Instructions: [place] - Preparing for Surgery  Before surgery, you can play an important role.  Because skin is not sterile, your skin needs to be as free of germs as possible.  You can reduce the number of germs on you skin by washing with CHG (chlorahexidine gluconate) soap before surgery.  CHG is an antiseptic cleaner which  kills germs and bonds with the skin to continue killing germs even after washing.  Please DO NOT use if you have an allergy to CHG or antibacterial soaps.  If your skin becomes reddened/irritated stop using the CHG and inform your nurse when you arrive at Short Stay.  Do not shave (including legs and underarms) for at least 48 hours prior to the first CHG shower.  You may shave your face.  Please follow these instructions carefully:   1.  Shower with CHG Soap the night before surgery and the morning of Surgery.  2.  If you choose to wash your hair, wash your hair first as usual with your normal shampoo.  3.  After you shampoo, rinse your hair and body thoroughly to remove the Shampoo.  4.  Use CHG as you would any other liquid soap.  You can apply chg directly  to the skin and wash gently with scrungie or a clean washcloth.  5.  Apply the CHG Soap to your body ONLY FROM THE NECK DOWN.  Do not use on open wounds or open sores.  Avoid contact with your eyes ears, mouth and genitals (private parts).  Wash genitals (private parts)       with your normal soap.  6.  Wash thoroughly, paying special attention to the area where your surgery will be performed.  7.  Thoroughly rinse your body with warm water  from the neck down.  8.  DO NOT shower/wash with your normal soap after using and rinsing off the CHG Soap.  9.  Pat yourself dry with a clean towel.            10.  Wear clean pajamas.            11.  Place clean sheets on your bed the night of your first shower and do not sleep with pets.  Day of Surgery  Do not apply any lotions/deodorants the morning of surgery.  Please wear clean clothes to the hospital/surgery center.  Please read over the following fact sheets that you were given. Pain Booklet, Coughing and Deep Breathing, Blood Transfusion Information, Total Joint Packet, MRSA Information and Surgical Site Infection Prevention

## 2014-12-27 ENCOUNTER — Inpatient Hospital Stay (HOSPITAL_COMMUNITY): Payer: Medicare Other | Admitting: Anesthesiology

## 2014-12-27 ENCOUNTER — Inpatient Hospital Stay (HOSPITAL_COMMUNITY): Payer: Medicare Other

## 2014-12-27 ENCOUNTER — Encounter (HOSPITAL_COMMUNITY)
Admission: RE | Disposition: A | Discharge status: Home / Self Care | Payer: Self-pay | Source: Ambulatory Visit | Attending: Orthopedic Surgery

## 2014-12-27 ENCOUNTER — Encounter (HOSPITAL_COMMUNITY): Payer: Self-pay

## 2014-12-27 ENCOUNTER — Observation Stay (HOSPITAL_COMMUNITY)
Admission: RE | Admit: 2014-12-27 | Discharge: 2014-12-28 | Disposition: A | Discharge status: Home / Self Care | Payer: Medicare Other | Source: Ambulatory Visit | Attending: Orthopedic Surgery | Admitting: Orthopedic Surgery

## 2014-12-27 DIAGNOSIS — Z7982 Long term (current) use of aspirin: Secondary | ICD-10-CM | POA: Insufficient documentation

## 2014-12-27 DIAGNOSIS — M533 Sacrococcygeal disorders, not elsewhere classified: Secondary | ICD-10-CM | POA: Diagnosis present

## 2014-12-27 DIAGNOSIS — M1990 Unspecified osteoarthritis, unspecified site: Secondary | ICD-10-CM | POA: Diagnosis not present

## 2014-12-27 DIAGNOSIS — E7800 Pure hypercholesterolemia, unspecified: Secondary | ICD-10-CM | POA: Diagnosis not present

## 2014-12-27 DIAGNOSIS — I10 Essential (primary) hypertension: Secondary | ICD-10-CM | POA: Diagnosis not present

## 2014-12-27 DIAGNOSIS — Z96642 Presence of left artificial hip joint: Secondary | ICD-10-CM | POA: Insufficient documentation

## 2014-12-27 DIAGNOSIS — Z79891 Long term (current) use of opiate analgesic: Secondary | ICD-10-CM | POA: Diagnosis not present

## 2014-12-27 DIAGNOSIS — E039 Hypothyroidism, unspecified: Secondary | ICD-10-CM | POA: Insufficient documentation

## 2014-12-27 DIAGNOSIS — Z79899 Other long term (current) drug therapy: Secondary | ICD-10-CM | POA: Insufficient documentation

## 2014-12-27 DIAGNOSIS — Z96651 Presence of right artificial knee joint: Secondary | ICD-10-CM | POA: Insufficient documentation

## 2014-12-27 DIAGNOSIS — Z419 Encounter for procedure for purposes other than remedying health state, unspecified: Secondary | ICD-10-CM

## 2014-12-27 HISTORY — PX: SACROILIAC JOINT FUSION: SHX6088

## 2014-12-27 SURGERY — SACROILIAC JOINT FUSION
Anesthesia: General | Site: Back | Laterality: Right

## 2014-12-27 MED ORDER — ONDANSETRON HCL 4 MG/2ML IJ SOLN
INTRAMUSCULAR | Status: DC | PRN
  Administered 2014-12-27 (×2): 4 mg via INTRAVENOUS

## 2014-12-27 MED ORDER — ONDANSETRON HCL 4 MG PO TABS: 4.0000 mg | ORAL_TABLET | Freq: Three times a day (TID) | ORAL | Status: DC | PRN

## 2014-12-27 MED ORDER — PHENYLEPHRINE HCL 10 MG/ML IJ SOLN
INTRAMUSCULAR | Status: DC | PRN
  Administered 2014-12-27 (×3): 80 ug via INTRAVENOUS

## 2014-12-27 MED ORDER — NEOSTIGMINE METHYLSULFATE 10 MG/10ML IV SOLN
INTRAVENOUS | Status: DC | PRN
  Administered 2014-12-27: 4 mg via INTRAVENOUS

## 2014-12-27 MED ORDER — DEXAMETHASONE 4 MG PO TABS
4.0000 mg | ORAL_TABLET | Freq: Four times a day (QID) | ORAL | Status: DC
  Administered 2014-12-27 – 2014-12-28 (×3): 4 mg via ORAL
  Filled 2014-12-27 (×3): qty 1

## 2014-12-27 MED ORDER — MORPHINE SULFATE (PF) 2 MG/ML IV SOLN: 1.0000 mg | INTRAVENOUS | Status: DC | PRN

## 2014-12-27 MED ORDER — ARTIFICIAL TEARS OP OINT
TOPICAL_OINTMENT | OPHTHALMIC | Status: AC
  Filled 2014-12-27: qty 3.5

## 2014-12-27 MED ORDER — LACTATED RINGERS IV SOLN: INTRAVENOUS | Status: DC

## 2014-12-27 MED ORDER — POLYETHYLENE GLYCOL 3350 17 GM/SCOOP PO POWD: 17.0000 g | Freq: Every day | ORAL | Status: DC

## 2014-12-27 MED ORDER — LISINOPRIL 20 MG PO TABS
20.0000 mg | ORAL_TABLET | Freq: Every day | ORAL | Status: DC
  Administered 2014-12-27 – 2014-12-28 (×2): 20 mg via ORAL
  Filled 2014-12-27 (×2): qty 1

## 2014-12-27 MED ORDER — PHENYLEPHRINE 40 MCG/ML (10ML) SYRINGE FOR IV PUSH (FOR BLOOD PRESSURE SUPPORT)
PREFILLED_SYRINGE | INTRAVENOUS | Status: AC
Start: 2014-12-27 — End: 2014-12-27
  Filled 2014-12-27: qty 10

## 2014-12-27 MED ORDER — 0.9 % SODIUM CHLORIDE (POUR BTL) OPTIME
TOPICAL | Status: DC | PRN
  Administered 2014-12-27: 1000 mL

## 2014-12-27 MED ORDER — PROMETHAZINE HCL 25 MG/ML IJ SOLN: 6.2500 mg | INTRAMUSCULAR | Status: DC | PRN

## 2014-12-27 MED ORDER — HYDROCHLOROTHIAZIDE 12.5 MG PO CAPS: 12.5000 mg | ORAL_CAPSULE | Freq: Every day | ORAL | Status: DC

## 2014-12-27 MED ORDER — MENTHOL 3 MG MT LOZG: 1.0000 | LOZENGE | OROMUCOSAL | Status: DC | PRN

## 2014-12-27 MED ORDER — LACTATED RINGERS IV SOLN
INTRAVENOUS | Status: DC | PRN
  Administered 2014-12-27 (×2): via INTRAVENOUS

## 2014-12-27 MED ORDER — LIDOCAINE HCL (CARDIAC) 20 MG/ML IV SOLN
INTRAVENOUS | Status: AC
  Filled 2014-12-27: qty 5

## 2014-12-27 MED ORDER — METHOCARBAMOL 1000 MG/10ML IJ SOLN
500.0000 mg | Freq: Four times a day (QID) | INTRAVENOUS | Status: DC | PRN
  Administered 2014-12-27: 500 mg via INTRAVENOUS
  Filled 2014-12-27 (×2): qty 5

## 2014-12-27 MED ORDER — DEXAMETHASONE SODIUM PHOSPHATE 10 MG/ML IJ SOLN
INTRAMUSCULAR | Status: AC
  Filled 2014-12-27: qty 1

## 2014-12-27 MED ORDER — PROPOFOL 10 MG/ML IV BOLUS
INTRAVENOUS | Status: DC | PRN
  Administered 2014-12-27 (×2): 100 mg via INTRAVENOUS

## 2014-12-27 MED ORDER — OXYCODONE HCL 5 MG PO TABS: 10.0000 mg | ORAL_TABLET | ORAL | Status: DC | PRN

## 2014-12-27 MED ORDER — LISINOPRIL-HYDROCHLOROTHIAZIDE 20-12.5 MG PO TABS: 1.0000 | ORAL_TABLET | Freq: Every day | ORAL | Status: DC

## 2014-12-27 MED ORDER — CEFAZOLIN SODIUM 1-5 GM-% IV SOLN
1.0000 g | Freq: Three times a day (TID) | INTRAVENOUS | Status: AC
  Administered 2014-12-27 (×2): 1 g via INTRAVENOUS
  Filled 2014-12-27 (×2): qty 50

## 2014-12-27 MED ORDER — CEFAZOLIN SODIUM-DEXTROSE 2-3 GM-% IV SOLR
2.0000 g | INTRAVENOUS | Status: AC
  Administered 2014-12-27: 2 g via INTRAVENOUS

## 2014-12-27 MED ORDER — ROCURONIUM BROMIDE 100 MG/10ML IV SOLN
INTRAVENOUS | Status: DC | PRN
  Administered 2014-12-27: 50 mg via INTRAVENOUS

## 2014-12-27 MED ORDER — FENTANYL CITRATE (PF) 100 MCG/2ML IJ SOLN
25.0000 ug | INTRAMUSCULAR | Status: DC | PRN
  Administered 2014-12-27 (×4): 25 ug via INTRAVENOUS

## 2014-12-27 MED ORDER — ONDANSETRON HCL 4 MG/2ML IJ SOLN
4.0000 mg | INTRAMUSCULAR | Status: DC | PRN
  Administered 2014-12-27: 4 mg via INTRAVENOUS
  Filled 2014-12-27: qty 2

## 2014-12-27 MED ORDER — ROCURONIUM BROMIDE 50 MG/5ML IV SOLN
INTRAVENOUS | Status: AC
  Filled 2014-12-27: qty 1

## 2014-12-27 MED ORDER — LISINOPRIL 20 MG PO TABS: 20.0000 mg | ORAL_TABLET | Freq: Every day | ORAL | Status: DC

## 2014-12-27 MED ORDER — OXYCODONE-ACETAMINOPHEN 10-325 MG PO TABS: 1.0000 | ORAL_TABLET | Freq: Four times a day (QID) | ORAL | Status: DC | PRN

## 2014-12-27 MED ORDER — PHENOL 1.4 % MT LIQD
1.0000 | OROMUCOSAL | Status: DC | PRN
Start: 2014-12-27 — End: 2014-12-28

## 2014-12-27 MED ORDER — DEXAMETHASONE SODIUM PHOSPHATE 10 MG/ML IJ SOLN
INTRAMUSCULAR | Status: DC | PRN
  Administered 2014-12-27: 10 mg via INTRAVENOUS

## 2014-12-27 MED ORDER — MIDAZOLAM HCL 2 MG/2ML IJ SOLN
INTRAMUSCULAR | Status: AC
  Filled 2014-12-27: qty 4

## 2014-12-27 MED ORDER — FENTANYL CITRATE (PF) 250 MCG/5ML IJ SOLN
INTRAMUSCULAR | Status: AC
  Filled 2014-12-27: qty 5

## 2014-12-27 MED ORDER — GLYCOPYRROLATE 0.2 MG/ML IJ SOLN
INTRAMUSCULAR | Status: DC | PRN
  Administered 2014-12-27: 0.6 mg via INTRAVENOUS
  Administered 2014-12-27: 0.1 mg via INTRAVENOUS

## 2014-12-27 MED ORDER — HYDROCHLOROTHIAZIDE 12.5 MG PO CAPS
12.5000 mg | ORAL_CAPSULE | Freq: Every day | ORAL | Status: DC
  Administered 2014-12-27 – 2014-12-28 (×2): 12.5 mg via ORAL
  Filled 2014-12-27: qty 1

## 2014-12-27 MED ORDER — SODIUM CHLORIDE 0.9 % IJ SOLN
3.0000 mL | Freq: Two times a day (BID) | INTRAMUSCULAR | Status: DC
  Administered 2014-12-27 (×2): 3 mL via INTRAVENOUS

## 2014-12-27 MED ORDER — GLYCOPYRROLATE 0.2 MG/ML IJ SOLN
INTRAMUSCULAR | Status: AC
  Filled 2014-12-27: qty 1

## 2014-12-27 MED ORDER — DIPHENHYDRAMINE HCL 25 MG PO CAPS
25.0000 mg | ORAL_CAPSULE | ORAL | Status: DC | PRN
  Administered 2014-12-27: 25 mg via ORAL
  Filled 2014-12-27: qty 1

## 2014-12-27 MED ORDER — MIDAZOLAM HCL 2 MG/2ML IJ SOLN
INTRAMUSCULAR | Status: DC | PRN
  Administered 2014-12-27: 2 mg via INTRAVENOUS

## 2014-12-27 MED ORDER — BUPIVACAINE-EPINEPHRINE (PF) 0.25% -1:200000 IJ SOLN
INTRAMUSCULAR | Status: AC
  Filled 2014-12-27: qty 30

## 2014-12-27 MED ORDER — SODIUM CHLORIDE 0.9 % IJ SOLN: 3.0000 mL | INTRAMUSCULAR | Status: DC | PRN

## 2014-12-27 MED ORDER — METHOCARBAMOL 500 MG PO TABS
500.0000 mg | ORAL_TABLET | Freq: Four times a day (QID) | ORAL | Status: DC | PRN
  Administered 2014-12-27: 500 mg via ORAL
  Filled 2014-12-27 (×2): qty 1

## 2014-12-27 MED ORDER — LEVOTHYROXINE SODIUM 112 MCG PO TABS
112.0000 ug | ORAL_TABLET | Freq: Every day | ORAL | Status: DC
  Administered 2014-12-28: 112 ug via ORAL
  Filled 2014-12-27 (×3): qty 1

## 2014-12-27 MED ORDER — PROPOFOL 10 MG/ML IV BOLUS
INTRAVENOUS | Status: AC
  Filled 2014-12-27: qty 20

## 2014-12-27 MED ORDER — ONDANSETRON HCL 4 MG/2ML IJ SOLN
INTRAMUSCULAR | Status: AC
  Filled 2014-12-27: qty 2

## 2014-12-27 MED ORDER — LIDOCAINE HCL (CARDIAC) 20 MG/ML IV SOLN
INTRAVENOUS | Status: DC | PRN
  Administered 2014-12-27: 80 mg via INTRAVENOUS

## 2014-12-27 MED ORDER — FENTANYL CITRATE (PF) 250 MCG/5ML IJ SOLN
INTRAMUSCULAR | Status: DC | PRN
  Administered 2014-12-27: 50 ug via INTRAVENOUS
  Administered 2014-12-27: 100 ug via INTRAVENOUS

## 2014-12-27 MED ORDER — BUPIVACAINE-EPINEPHRINE 0.25% -1:200000 IJ SOLN
INTRAMUSCULAR | Status: DC | PRN
  Administered 2014-12-27: 20 mL

## 2014-12-27 MED ORDER — FENTANYL CITRATE (PF) 100 MCG/2ML IJ SOLN
INTRAMUSCULAR | Status: AC
  Filled 2014-12-27: qty 2

## 2014-12-27 MED ORDER — DEXAMETHASONE SODIUM PHOSPHATE 4 MG/ML IJ SOLN
4.0000 mg | Freq: Four times a day (QID) | INTRAMUSCULAR | Status: DC
  Administered 2014-12-27: 4 mg via INTRAVENOUS
  Filled 2014-12-27: qty 1

## 2014-12-27 MED ORDER — CEFAZOLIN SODIUM-DEXTROSE 2-3 GM-% IV SOLR
INTRAVENOUS | Status: AC
  Filled 2014-12-27: qty 50

## 2014-12-27 SURGICAL SUPPLY — 50 items
BLADE SURG ROTATE 9660 (MISCELLANEOUS) IMPLANT
CLOSURE STERI-STRIP 1/2X4 (GAUZE/BANDAGES/DRESSINGS) ×1
CLSR STERI-STRIP ANTIMIC 1/2X4 (GAUZE/BANDAGES/DRESSINGS) ×2 IMPLANT
COVER SURGICAL LIGHT HANDLE (MISCELLANEOUS) ×3 IMPLANT
DRAPE C-ARM 42X72 X-RAY (DRAPES) ×3 IMPLANT
DRAPE C-ARMOR (DRAPES) ×3 IMPLANT
DRAPE SURG 17X23 STRL (DRAPES) ×3 IMPLANT
DRAPE U-SHAPE 47X51 STRL (DRAPES) ×3 IMPLANT
DRSG MEPILEX BORDER 4X4 (GAUZE/BANDAGES/DRESSINGS) ×2 IMPLANT
DRSG MEPILEX BORDER 4X8 (GAUZE/BANDAGES/DRESSINGS) ×3 IMPLANT
DURAPREP 26ML APPLICATOR (WOUND CARE) ×3 IMPLANT
ELECT BLADE 4.0 EZ CLEAN MEGAD (MISCELLANEOUS) ×3
ELECT PENCIL ROCKER SW 15FT (MISCELLANEOUS) ×3 IMPLANT
ELECT REM PT RETURN 9FT ADLT (ELECTROSURGICAL) ×3
ELECTRODE BLDE 4.0 EZ CLN MEGD (MISCELLANEOUS) ×1 IMPLANT
ELECTRODE REM PT RTRN 9FT ADLT (ELECTROSURGICAL) ×1 IMPLANT
GLOVE BIOGEL PI IND STRL 8.5 (GLOVE) ×1 IMPLANT
GLOVE BIOGEL PI INDICATOR 8.5 (GLOVE) ×2
GLOVE SS BIOGEL STRL SZ 8.5 (GLOVE) ×1 IMPLANT
GLOVE SUPERSENSE BIOGEL SZ 8.5 (GLOVE) ×2
GOWN STRL REUS W/ TWL LRG LVL3 (GOWN DISPOSABLE) ×1 IMPLANT
GOWN STRL REUS W/TWL 2XL LVL3 (GOWN DISPOSABLE) ×3 IMPLANT
GOWN STRL REUS W/TWL LRG LVL3 (GOWN DISPOSABLE) ×3
GUIDE PIN ×1 IMPLANT
KIT BASIN OR (CUSTOM PROCEDURE TRAY) ×3 IMPLANT
KIT ROOM TURNOVER OR (KITS) ×3 IMPLANT
MIX DBX 10CC 35% BONE (Bone Implant) ×2 IMPLANT
NEEDLE 22X1 1/2 (OR ONLY) (NEEDLE) ×3 IMPLANT
NS IRRIG 1000ML POUR BTL (IV SOLUTION) ×3 IMPLANT
PACK LAMINECTOMY ORTHO (CUSTOM PROCEDURE TRAY) ×3 IMPLANT
PACK UNIVERSAL I (CUSTOM PROCEDURE TRAY) ×3 IMPLANT
PAD ARMBOARD 7.5X6 YLW CONV (MISCELLANEOUS) ×6 IMPLANT
POSITIONER HEAD PRONE TRACH (MISCELLANEOUS) ×3 IMPLANT
SCREW DUAL THRD SACRO LCK 7X45 (Screw) ×2 IMPLANT
SCREW DUAL THREADED 12.5X40MM (Screw) ×1 IMPLANT
SCREW DUAL THREADED 12.5X45MM (Screw) ×2 IMPLANT
SUT MNCRL AB 3-0 PS2 18 (SUTURE) IMPLANT
SUT MON AB 3-0 SH 27 (SUTURE) ×3
SUT MON AB 3-0 SH27 (SUTURE) IMPLANT
SUT VIC AB 1 CT1 18XCR BRD 8 (SUTURE) ×1 IMPLANT
SUT VIC AB 1 CT1 8-18 (SUTURE) ×3
SUT VIC AB 2-0 CT1 18 (SUTURE) ×3 IMPLANT
SYR BULB IRRIGATION 50ML (SYRINGE) ×3 IMPLANT
SYR CONTROL 10ML LL (SYRINGE) ×3 IMPLANT
TOWEL OR 17X24 6PK STRL BLUE (TOWEL DISPOSABLE) ×3 IMPLANT
TOWEL OR 17X26 10 PK STRL BLUE (TOWEL DISPOSABLE) ×3 IMPLANT
TROCHAR PIN ×6 IMPLANT
WASHER 13 (Washer) ×2 IMPLANT
WATER STERILE IRR 1000ML POUR (IV SOLUTION) ×1 IMPLANT
YANKAUER SUCT BULB TIP NO VENT (SUCTIONS) ×3 IMPLANT

## 2014-12-27 NOTE — Transfer of Care (Signed)
Immediate Anesthesia Transfer of Care Note  Patient: [name]  Procedure(s) Performed: Procedure(s) with comments: RIGHT SACROILIAC JOINT FUSION (Right) - right sacral fusion  Patient Location: PACU  Anesthesia Type:General  Level of Consciousness: awake, alert  and oriented  Airway & Oxygen Therapy: Patient Spontanous Breathing and Patient connected to face mask oxygen  Post-op Assessment: Report given to RN, Post -op Vital signs reviewed and stable and Patient moving all extremities X 4  Post vital signs: Reviewed and stable  Last Vitals:  Filed Vitals:   12/27/14 1024  BP:   Pulse: 93  Temp: 36.5 C  Resp:     Complications: No apparent anesthesia complications

## 2014-12-27 NOTE — Anesthesia Postprocedure Evaluation (Signed)
  Anesthesia Post-op Note  Patient: [name]  Procedure(s) Performed: Procedure(s) (LRB): RIGHT SACROILIAC JOINT FUSION (Right)  Patient Location: PACU  Anesthesia Type: General  Level of Consciousness: awake and alert   Airway and Oxygen Therapy: Patient Spontanous Breathing  Post-op Pain: mild  Post-op Assessment: Post-op Vital signs reviewed, Patient's Cardiovascular Status Stable, Respiratory Function Stable, Patent Airway and No signs of Nausea or vomiting  Last Vitals:  Filed Vitals:   12/27/14 1215  BP: 130/56  Pulse: 79  Temp: 36.5 C  Resp: 18    Post-op Vital Signs: stable   Complications: No apparent anesthesia complications

## 2014-12-27 NOTE — H&P (Signed)
History of Present Illness The patient is a 69 year old female who comes in today for a preoperative History and Physical. The patient is scheduled for a right SI joint fusion to be performed by Dr. Duane Lope D. Rolena Infante, MD on 12-27-14 . Please see the hospital record for complete dictated history and physical. she has right LE pain too. She recalls sibnificant improvement with the SI injection but found it short lived.  Allergies No Known Drug Allergies02/02/2011  Family History Mother Deceased, Heart disease, Diabetes mellitus, Type II. age 51 Hypertension First Degree Relatives. mother and father Father Deceased, Heart disease. age 37 Diabetes Mellitus mother Heart Disease mother and father First Degree Relatives  Social History Advance Directives Living Will, Healthcare POA Living situation live with spouse Illicit drug use no Post-Surgical Plans Plan is to go home. 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 03-09-14 Drug/Alcohol Rehab (Previously) no Alcohol use never consumed alcohol 03-09-14 Children 3 Exercise Exercises weekly; does team sport  Medication History Kendra Opitz Young; 12/19/2014 7:57 AM) OxyCODONE HCl (5MG  Tablet, 1 (one) Oral four times daily, as needed, Taken starting 11/10/2014) Active. Baclofen (10MG  Tablet, 1 (one) Oral three times daily, as needed, Taken starting 11/10/2014) Active. Gabapentin (300MG  Capsule, Oral) Active. Temazepam (30MG  Capsule, Oral) Active. (qhs) Levothyroxine Sodium (Oral) Specific dose unknown - Active. Lisinopril-Hydrochlorothiazide (20-12.5MG  Tablet, Oral) Active. (qd) Fish Oil Concentrate (435MG  Capsule, 1 (one) Oral) Active. Levothyroxine Sodium Active. (.112 mcg qd) Red Yeast Rice (600MG  Capsule, Oral) Active. ThermaCare  Muscle/Joint Active. (patches prn) Ubiquinol (100MG  Capsule, Oral) Active. (qd) Krill Oil Ultra Strength (1500MG  Capsule, Oral) Active. (bid) Aspirin (81MG  Tablet, 1 (one) Oral) Active. Multiple Vitamins/Womens (1 (one) Oral) Active. Vitamin D (50000U Capsule, 1 (one) Oral) Active. (qd) Caltrate 600+D (600-800MG -UNIT Tablet, Oral) Active. (1200mg  qd) Medications Reconciled  Past Surgical History Kendra Opitz Young; 12/19/2014 7:52 AM) Total Knee Replacement Right Total Hip Replacement left  Other Problems (Robin C Young; 12/19/2014 7:53 AM) High blood pressure Hypercholesterolemia Hypothyroidism Measles Menopause Mumps Shingles  Vitals (Robin C. Young; 12/19/2014 7:58 AM) 12/19/2014 7:57 AM Weight: 178 lb Height: 64in Weight was reported by patient. Height was reported by patient. Body Surface Area: 1.86 m Body Mass Index: 30.55 kg/m  Temp.: 98.80F(Oral)  Pulse: 77 (Regular)  BP: 140/82 (Sitting, Left Arm, Standard)  General General Appearance-Not in acute distress. Orientation-Oriented X3. Build & Nutrition-Well nourished and Well developed.  Integumentary General Characteristics Surgical Scars - surgical scarring consistent with previous right hip surgery, surgical scarring consistent with previous left hip surgery, surgical scarring consistent with previous right knee surgery, no surgical scar evidence of previous lumbar surgery. Lumbar Spine-Skin examination of the lumbar spine is without deformity, skin lesions, lacerations or abrasions.  Chest and Lung Exam Auscultation Breath sounds - Normal and Clear.  Cardiovascular Auscultation Rhythm - Regular rate and rhythm.  Abdomen Palpation/Percussion Palpation and Percussion of the abdomen reveal - Soft, Non Tender and No Rebound tenderness.  Peripheral Vascular Lower Extremity Palpation - Posterior tibial pulse - Bilateral - 2+. Dorsalis pedis pulse - Bilateral -  2+.  Neurologic Sensation Lower Extremity - Bilateral - sensation is intact in the lower extremity. Reflexes Patellar Reflex - Bilateral - 2+. Achilles Reflex - Bilateral - 2+. Clonus - Bilateral - clonus not present. Hoffman's Sign - Bilateral - Hoffman's sign not present. Testing Seated Straight Leg  Raise - Bilateral - Seated straight leg raise negative.  Musculoskeletal Spine/Ribs/Pelvis  Lumbosacral Spine: Inspection and Palpation - Tenderness - right sacroiliac jointl tender to palpation. Strength and Tone: Strength - Hip Flexion - Bilateral - 5/5. Knee Extension - Bilateral - 5/5. Knee Flexion - Bilateral - 5/5. Ankle Dorsiflexion - Bilateral - 5/5. Ankle Plantarflexion - Bilateral - 5/5. Heel walk - Left - able to heel walk without difficulty. Right - able to heel walk with moderate difficulty. Toe Walk - Left - able to walk on toes without difficulty. Right - able to walk on toes with moderate difficulty. Heel-Toe Walk - Left - able to heel-toe walk without difficulty. Right - able to heel-toe walk with mild difficulty. ROM - Flexion - mildly decreased range of motion and painful. Extension - mildly decreased range of motion and painful. Left Lateral Bending - full range of motion. Right Lateral Bending - full range of motion. Right Rotation - full range of motion. Left Rotation - full range of motion. Pain - neither flexion or extension is more painful than the other. Lumbosacral Spine - Waddell's Signs - no Waddell's signs present. Lower Extremity Range of Motion - No true hip, knee or ankle pain with range of motion. Gait and Station - [employer] - no assistive devices.   PLAN The patient presents today primarily to discuss her surgical and nonoperative options. She continues to have significant relief from the SI injection that she received back in May. She wants to go on vacation and does not wish to have the SI fusion yet. She understands that she is able to have another SI  injection if needed. She also understands the surgery that would be done and will let us know how when she wants to proceed. At this point, she is indicated that she is very interested in moving forward with the SI fusion. We have gone over this. I have included the risks, which are infection, bleeding, nerve damage, death, stroke, paralysis, nonunion, direct damage to the L5 or sacral nerve roots causing difficulty walking and even problems with bowel and bladder control. All her questions were encouraged and addressed. We will move forward as soon as we have insurance plan and her primary care physician clearance. I did also tell her that after surgery she will be touchdown weightbearing with crutches for about four to six weeks.

## 2014-12-27 NOTE — Brief Op Note (Signed)
12/27/2014  9:52 AM  PATIENT:  [name]  69 y.o. female  PRE-OPERATIVE DIAGNOSIS:  RIGHT SACROILIAC JOINT DEGENERATIVE JOINT DISEASE  POST-OPERATIVE DIAGNOSIS:  * No post-op diagnosis entered *  PROCEDURE:  Procedure(s): RIGHT SACROILIAC JOINT FUSION (Right)  SURGEON:  Surgeon(s) and Role:    * Melina Schools, MD - Primary  PHYSICIAN ASSISTANT:   ASSISTANTS: Carmen Mayo   ANESTHESIA:   general  EBL:  Total I/O In: -  Out: 15 [Blood:15]  BLOOD ADMINISTERED:none  DRAINS: none   LOCAL MEDICATIONS USED:  MARCAINE     SPECIMEN:  No Specimen  DISPOSITION OF SPECIMEN:  N/A  COUNTS:  YES  TOURNIQUET:  * No tourniquets in log *  DICTATION: .Other Dictation: Dictation Number [id number]  PLAN OF CARE: Admit for overnight observation  PATIENT DISPOSITION:  PACU - hemodynamically stable.

## 2014-12-27 NOTE — Anesthesia Procedure Notes (Signed)
Procedure Name: Intubation Date/Time: 12/27/2014 8:40 AM Performed by: Collier Bullock Pre-anesthesia Checklist: Patient identified, Emergency Drugs available, Suction available and Patient being monitored Patient Re-evaluated:Patient Re-evaluated prior to inductionOxygen Delivery Method: Circle system utilized Preoxygenation: Pre-oxygenation with 100% oxygen Intubation Type: IV induction Ventilation: Mask ventilation without difficulty Laryngoscope Size: Glidescope and 3 Grade View: Grade III Tube type: Oral Tube size: 7.0 mm Number of attempts: 2 Airway Equipment and Method: Video-laryngoscopy Placement Confirmation: ETT inserted through vocal cords under direct vision,  positive ETCO2 and breath sounds checked- equal and bilateral Secured at: 22 cm Tube secured with: Tape Dental Injury: Teeth and Oropharynx as per pre-operative assessment  Difficulty Due To: Difficulty was unanticipated Future Recommendations: Recommend- induction with short-acting agent, and alternative techniques readily available Comments: Airway extremely anterior, with poor visual of cords with MAC3, glidescope used with tube passed into vocal cords, vss no trauma noted.

## 2014-12-27 NOTE — Evaluation (Signed)
Physical Therapy Evaluation Patient Details Name: [name] MRN: [id number] DOB: 04-23-1945 Today's Date: 12/27/2014   History of Present Illness  Patient is a 69 y/o female s/p right SI joint fusion. PMH includes HTN, hypothyroidism.  Clinical Impression  Patient presents with new NWB status RLE s/p above surgery impacting mobility. Pt has been practicing ambulation with RW vs crutches at home with NWB status to prepare for surgery as well as stair training. Discussed stair negotiation techniques with pt and spouse. Will plan for stair training next session (using RW backwards) however pt has been using 1 crutch and handrail hopping forwards at home. Will follow acutely to maximize independence and mobility prior to return home.     Follow Up Recommendations No PT follow up;Supervision for mobility/OOB    Equipment Recommendations  None recommended by PT    Recommendations for Other Services OT consult     Precautions / Restrictions Precautions Precautions: Fall Restrictions Weight Bearing Restrictions: Yes RLE Weight Bearing: Non weight bearing      Mobility  Bed Mobility Overal bed mobility: Needs Assistance Bed Mobility: Supine to Sit     Supine to sit: Min guard;HOB elevated     General bed mobility comments: Use of rails. No physical assist needed.  Transfers Overall transfer level: Needs assistance Equipment used: Rolling walker (2 wheeled) Transfers: Sit to/from Stand Sit to Stand: Min guard         General transfer comment: Min guard for safety. Able to maintain NWB RLE. Stood from [employer], from toilet x1.   Ambulation/Gait Ambulation/Gait assistance: Min guard Ambulation Distance (Feet): 20 Feet (x2 bouts) Assistive device: Rolling walker (2 wheeled) Gait Pattern/deviations: Step-to pattern ("hop to")   Gait velocity interpretation: <1.8 ft/sec, indicative of risk for recurrent falls General Gait Details: Demonstrates "hop to" gait pattern  maintaining NWB RLE.  Stairs            Wheelchair Mobility    Modified Rankin (Stroke Patients Only)       Balance Overall balance assessment: Needs assistance Sitting-balance support: Feet supported;No upper extremity supported Sitting balance-Leahy Scale: Good     Standing balance support: During functional activity Standing balance-Leahy Scale: Poor Standing balance comment: Relient on RW for support due to NWb RLE.                             Pertinent Vitals/Pain Pain Assessment: Faces Faces Pain Scale: Hurts a little bit Pain Location: right hip Pain Descriptors / Indicators: Sore Pain Intervention(s): Repositioned;Monitored during session    Home Living Family/patient expects to be discharged to:: Private residence Living Arrangements: Spouse/significant other Available Help at Discharge: Family;Available 24 hours/day Type of Home: House Home Access: Stairs to enter Entrance Stairs-Rails: None Entrance Stairs-Number of Steps: 3 Home Layout: Two level;Bed/bath upstairs Home Equipment: Walker - 2 wheels;Crutches      Prior Function Level of Independence: Independent         Comments: Loves to travel.      Hand Dominance        Extremity/Trunk Assessment   Upper Extremity Assessment: Defer to OT evaluation           Lower Extremity Assessment: Generalized weakness         Communication   Communication: No difficulties  Cognition Arousal/Alertness: Awake/alert Behavior During Therapy: WFL for tasks assessed/performed Overall Cognitive Status: Within Functional Limits for tasks assessed  General Comments General comments (skin integrity, edema, etc.): Spouse present during session. Lengthy discussion on techniques to negotiate steps - using crutches vs RW    Exercises        Assessment/Plan    PT Assessment Patient needs continued PT services  PT Diagnosis Difficulty  walking;Abnormality of gait   PT Problem List Pain;Decreased balance;Decreased mobility;Decreased activity tolerance  PT Treatment Interventions Balance training;Gait training;Stair training;Functional mobility training;Therapeutic activities;Therapeutic exercise;Patient/family education   PT Goals (Current goals can be found in the Care Plan section) Acute Rehab PT Goals Patient Stated Goal: to go home PT Goal Formulation: With patient/family Time For Goal Achievement: 01/10/15 Potential to Achieve Goals: Fair    Frequency Min 5X/week   Barriers to discharge Inaccessible home environment 1 flight of steps to get to bedroom/full bath    Co-evaluation               End of Session Equipment Utilized During Treatment: Gait belt Activity Tolerance: Patient tolerated treatment well Patient left: in bed;with call bell/phone within reach;with family/visitor present (sitting EOB.) Nurse Communication: Mobility status;Weight bearing status (Will clarify WB status.)    Functional Assessment Tool Used: clinical judgment Functional Limitation: Mobility: Walking and moving around Mobility: Walking and Moving Around Current Status (B7628): At least 1 percent but less than 20 percent impaired, limited or restricted Mobility: Walking and Moving Around Goal Status [phone]): At least 1 percent but less than 20 percent impaired, limited or restricted    Time: 6160-7371 PT Time Calculation (min) (ACUTE ONLY): 23 min   Charges:   PT Evaluation $Initial PT Evaluation Tier I: 1 Procedure PT Treatments $Gait Training: 8-22 mins   PT G Codes:   PT G-Codes **NOT FOR INPATIENT CLASS** Functional Assessment Tool Used: clinical judgment Functional Limitation: Mobility: Walking and moving around Mobility: Walking and Moving Around Current Status (G6269): At least 1 percent but less than 20 percent impaired, limited or restricted Mobility: Walking and Moving Around Goal Status [phone]): At least 1  percent but less than 20 percent impaired, limited or restricted    McBride 12/27/2014, 4:49 PM Wray Kearns, Chauvin, DPT [phone]

## 2014-12-27 NOTE — H&P (Deleted)
History of Present Illness  The patient is a 69 year old female who comes in today for a preoperative History and Physical. The patient is scheduled for a Lumbar Decompression L3-4 to be performed by Dr. Duane Lope D. Rolena Infante, MD at [facility] on 12/27/14 . Please see the hospital record for complete dictated history and physical.  Additional reasons for visit:  Follow-up back is described as the following: The patient is being followed for their back pain (and is in to discuss surgery). Symptoms reported today include: pain, aching, pain with weightbearing (any type of movement), numbness (bilat L>R), burning, leg pain (bilat R>L) and foot pain. The patient states that they are doing poorly. Current treatment includes: activity modification. The following medication has been used for pain control: Trazadone and Lyrica. The patient reports their current pain level to be 0 / 10 (with Lyrica and no movement). Note for "Follow-up back": Patient states she has had RFA of L2,3,4,5 09-05-14 @ [facility]. the pt believes she is very sensitiveto anesthisia. she says at times she has been "foggy" for 2-3 weeks. She continues to have severe LBP and radiular pain b/l R>L.  Allergies  Nuts tree nuts and ground nuts (except almonds) Fish Meal *CHEMICALS* fish that swim  Family History Cancer mother Heart Disease mother Osteoarthritis mother Rheumatoid Arthritis father Severe allergy mother and father  Social History Tobacco use former smoker Tobacco / smoke exposure no Living situation live with spouse Illicit drug use no Exercise Exercises rarely; does running / walking Pain Contract no Number of flights of stairs before winded 2-3 Marital status married Drug/Alcohol Rehab (Previously) no Alcohol use current drinker; drinks wine; only occasionally per week Drug/Alcohol Rehab (Currently) no Current work status working full time Children 2  Medication History  Fish  Oil (1000MG  Capsule, Oral) Active. (QD) Vitamin C (500MG  Capsule, Oral) Active. (QD) Vitamin D-1000 Max St (1000UNIT Tablet, Oral) Active. (QD) Vitamin E (1000UNIT Capsule, Oral) Active. (QOD) Calcium 600 (1500 (600 Ca)MG Tablet, Oral) Active. (QD) Omeprazole (20MG  Tablet DR, Oral) Active. DULoxetine HCl (30MG  Capsule DR Part, Oral) Active. Cipro (250MG  Tablet, Oral as needed) Active. Epi E-Z Pen (1:1000 Device, Injection as needed) Active. Malarone (62.5-25MG  Tablet, Oral as needed) Active. Aspirin (81MG  Tablet, 1 (one) Oral) Active. Tylenol (325MG  Tablet, 1 (one) Oral) Active. Multiple Vitamin (1 (one) Oral) Active. Lyrica (100MG  Capsule, Oral) Active. (5 X DAY RX'D BY PAIN MANAGEMENT) Dexedrine (5MG  Tablet, Oral) Active. (PRN) Flonase Allergy Relief (50MCG/ACT Suspension, Nasal) Active. (QD) TraZODone HCl (50MG  Tablet, Oral) Active. (QHS) Pravastatin Sodium (80MG  Tablet, Oral) Active. (QD) Naproxen Sodium (220MG  Capsule, Oral) Active. (BID) Medications Reconciled  Vitals  12/22/2014 1:24 PM Weight: 156 lb Height: 63.5in Body Surface Area: 1.75 m Body Mass Index: 27.2 kg/m  Temp.: 98.68F  Pulse: 74 (Regular)  BP: 145/63 (Sitting, Left Arm, Standard)  Physical Exam  General General Appearance-Not in acute distress. Orientation-Oriented X3. Build & Nutrition-Well nourished and Well developed.  Integumentary General Characteristics Surgical Scars - no surgical scar evidence of previous lumbar surgery. Lumbar Spine-Skin examination of the lumbar spine is without deformity, skin lesions, lacerations or abrasions.  Abdomen Palpation/Percussion Palpation and Percussion of the abdomen reveal - Soft, Non Tender and No Rebound tenderness.  Peripheral Vascular Lower Extremity Palpation - Posterior tibial pulse - Bilateral - 2+. Dorsalis pedis pulse - Bilateral - 2+.  Neurologic Sensation Lower Extremity - Bilateral - sensation is intact  in the lower extremity. Reflexes Patellar Reflex - Bilateral - 2+. Achilles  Reflex - Bilateral - 2+. Clonus - Bilateral - clonus not present. Hoffman's Sign - Bilateral - Hoffman's sign not present. Testing Seated Straight Leg Raise - Right - Seated straight leg raise positive.  Musculoskeletal Spine/Ribs/Pelvis  Lumbosacral Spine: Inspection and Palpation - Tenderness - left lumbar paraspinals tender to palpation and right lumbar paraspinals tender to palpation, bony and soft tissue palpation of the lumbar spine and SI joint does not recreate their typical pain. Strength and Tone: Strength - Hip Flexion - Bilateral - 5/5. Knee Extension - Bilateral - 5/5. Knee Flexion - Bilateral - 5/5. Ankle Dorsiflexion - Bilateral - 5/5. Ankle Plantarflexion - Bilateral - 5/5. Heel walk - Bilateral - unable to heel walk. Toe Walk - Bilateral - unable to walk on toes. Heel-Toe Walk - Bilateral - able to heel-toe walk with mild difficulty. ROM - Flexion - mildly decreased range of motion and painful. Extension - mildly decreased range of motion and painful. Left Lateral Bending - mildly decreased range of motion and painful. Right Lateral Bending - mildly decreased range of motion and painful. Pain - neither flexion or extension is more painful than the other. Lumbosacral Spine - Waddell's Signs - no Waddell's signs present. Lower Extremity Range of Motion - No true hip, knee or ankle pain with range of motion. Gait and Station - [employer] - cane.She is status post her radiofrequency ablation of her cervical spine. She is doing well with that except she did have a significant increase in pain during the procedure, but overall, the neck is not long an active issue for her. Her low back on the other hand still continues to give her significant pain. Clinically, she has relief in pain with forward flexion, increased severity with extension. No focal neurological deficit, but she does have dysesthesias and  dysfunction in the legs and in both hamstrings, lateral thigh and occasionally below the knees. She has no loss in bowel or bladder control.  Assessment & Plan Goal Of Surgery:Discussed that goal of surgery is to reduce pain and improve function and quality of life. Patient is aware that despite all appropriate treatment that there pain and function could be the same, worse, or different. Posterior Lumbar Decompression/disectomy: Risks of surgery include infection, bleeding, nerve damage, death, stroke, paralysis, failure to heal, need for further surgery, ongoing or worse pain, need for further surgery, CSF leak, loss of bowel or bladder, and recurrent disc herniation or Stenosis which would necessitate need for further surgery.  At this point in time, we have again gone over her imaging studies. 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 I think the best course of action is addressing only the stenosis at L3-4. I think this is going to be an easier surgery for her to recover from and I think it is going to provide better relief of the neuropathic leg pain. I did tell her that in the future, she may have ongoing back pain that could necessitate a fusion and we can always do that at that time, but I really think that the bulk of her problem is secondary to the neurogenic claudication from her underlying spinal stenosis, which is most problematic at the L3-4 level. We have gone over the risks of a decompression, which include infection, bleeding, nerve damage, death, stroke, paralysis, ongoing or worse  pain, need for further surgery, leak of spinal fluid, need for fusion surgery. All of her questions were encouraged and addressed.

## 2014-12-27 NOTE — Op Note (Signed)
NAMEEDDITH, MENTOR                ACCOUNT NO.:  [id number]  MEDICAL RECORD NO.:  [id number]  LOCATION:  3C07C                        FACILITY:  [facility]  PHYSICIAN:  Fillmore Bynum D. Rolena Infante, M.D. DATE OF BIRTH:  1945-10-14  DATE OF PROCEDURE:  12/27/2014 DATE OF DISCHARGE:                              OPERATIVE REPORT   PREOPERATIVE DIAGNOSIS:  Right sacroiliac joint pain.  POSTOPERATIVE DIAGNOSIS:  Right sacroiliac joint pain.  OPERATIVE PROCEDURE:  Right sacroiliac joint fusion.  FIRST ASSISTANT:  [facility].  IMPLANT USED:  XI fusion device with a 45 mm Herbert lag screw, a 40 mm Herbert lag screw, and a 40 mm 6.5 screw with a washer.  COMPLICATIONS:  None.  CONDITION:  Stable.  HISTORY:  This is a very pleasant 69 year old who has been having progressive debilitating right SI joint pain.  Her only relief was SI joint injections which have provided significant improvement.  As a result of the failure of conservative management and the confirmation as isolated SI joint pain with injection therapy, we elected to proceed with an SI fusion.  All appropriate risks, benefits, and alternatives were discussed with the patient and husband and consent was obtained.  DESCRIPTION OF PROCEDURE:  The patient was brought to the operating room, placed supine on the operating table.  After successful induction of general anesthesia and endotracheal intubation, TEDs, SCDs were applied, and she was turned prone onto the Wilson frame.  All bony prominences were well padded.  Lateral right hip was prepped and draped in a standard fashion.  Time-out was taken, confirming the patient, procedure, and all other pertinent important data.  Once this was done, using lateral fluoroscopy, I marked out the anterior and posterior sacral lines as well as the sacral ala.  I then made a percutaneous stab incision and advanced the guide pin down to the inferior quadrant of the sacrum of the pelvis, so that I was  at the appropriate starting position.  This was near the junction of the posterior sacral line and the sacral ala.  Once I confirmed satisfactory position on the lateral, I then went to the inlet and outlet views and advanced my guide pin across the SI joint and into the sacrum.  I made sure that I have maintained the lateral position to the foramen and that I was not skiving anteriorly.  Once the first guide pin was properly positioned, I used my guide device to place my second and third pins.  Once all 3 pins were across the SI joint in its satisfactory position in the lateral inlet and outlet view, I then made an incision connecting all 3 pins. With this in place, I then placed my trocars over each guide pin, measured and then drilled.  Once I drilled to the appropriate depth, I then used the curette to debride the SI joint.  I then tapped and then placed my first screw 45 mm length which was packed with a combination of bone graft that was harvested from the drill as well as DBX mix.  I then placed a second 40 mm screw in a similar fashion.  I then placed my last final screw with a washer.  All screws had excellent bony purchase. All screws maintained lateral to the foramen.  At this point, I was pleased with the overall final x-rays.  The wound was copiously irrigated with normal saline and closed in a layered fashion with interrupted #1 Vicryl sutures, 2-0 Vicryl sutures, and a 3-0 Monocryl. 20 mL of 0.25% Marcaine with epinephrine was then injected for postoperative analgesia.  Dry dressing was applied and the patient was ultimately extubated and transferred to the PACU without incident.  At the end of the case, all needle and sponge counts were correct.  There were no adverse intraoperative events.     Travian Kerner D. Rolena Infante, M.D.     DDB/MEDQ  D:  12/27/2014  T:  12/27/2014  Job:  [id number]  cc:   Duane Lope D. Rolena Infante, M.D.'s Office

## 2014-12-27 NOTE — Anesthesia Preprocedure Evaluation (Addendum)
Anesthesia Evaluation  Patient identified by MRN, date of birth, ID band Patient awake    Reviewed: Allergy & Precautions, NPO status , Patient's Chart, lab work & pertinent test results  History of Anesthesia Complications Negative for: history of anesthetic complications  Airway Mallampati: III  TM Distance: <3 FB Neck ROM: Full    Dental  (+) Teeth Intact, Dental Advisory Given   Pulmonary neg pulmonary ROS,    Pulmonary exam normal breath sounds clear to auscultation       Cardiovascular hypertension, Pt. on medications (-) angina(-) Past MI Normal cardiovascular exam Rhythm:Regular Rate:Normal     Neuro/Psych negative neurological ROS  negative psych ROS   GI/Hepatic negative GI ROS, Neg liver ROS,   Endo/Other  Hypothyroidism   Renal/GU negative Renal ROS     Musculoskeletal  (+) Arthritis , Osteoarthritis,    Abdominal   Peds  Hematology negative hematology ROS (+)   Anesthesia Other Findings Day of surgery medications reviewed with the patient.  Reproductive/Obstetrics                            Anesthesia Physical Anesthesia Plan  ASA: II  Anesthesia Plan: General   Post-op Pain Management:    Induction: Intravenous  Airway Management Planned: Oral ETT  Additional Equipment:   Intra-op Plan:   Post-operative Plan: Extubation in OR  Informed Consent: I have reviewed the patients History and Physical, chart, labs and discussed the procedure including the risks, benefits and alternatives for the proposed anesthesia with the patient or authorized representative who has indicated his/her understanding and acceptance.   Dental advisory given  Plan Discussed with: CRNA  Anesthesia Plan Comments: (Risks/benefits of general anesthesia discussed with patient including risk of damage to teeth, lips, gum, and tongue, nausea/vomiting, allergic reactions to medications, and the  possibility of heart attack, stroke and death.  All patient questions answered.  Patient wishes to proceed.)       Anesthesia Quick Evaluation

## 2014-12-28 ENCOUNTER — Encounter (HOSPITAL_COMMUNITY): Payer: Self-pay | Admitting: Orthopedic Surgery

## 2014-12-28 DIAGNOSIS — M533 Sacrococcygeal disorders, not elsewhere classified: Secondary | ICD-10-CM | POA: Diagnosis not present

## 2014-12-28 NOTE — Progress Notes (Signed)
Occupational Therapy Evaluation Patient Details Name: [name] MRN: [id number] DOB: Apr 20, 1945 Today's Date: 12/28/2014    History of Present Illness Patient is a 69 y/o female s/p right SI joint fusion. PMH includes HTN, hypothyroidism.   Clinical Impression    Educated patient on walk-in shower transfer technique and reviewed other ADL techniques. Patient and husband have no questions. No further OT needs; will sign off.  Follow Up Recommendations  No OT follow up;Supervision/Assistance - 24 hour    Equipment Recommendations  None recommended by OT    Recommendations for Other Services       Precautions / Restrictions Precautions Precautions: Fall Restrictions Weight Bearing Restrictions: Yes RLE Weight Bearing: Non weight bearing      Mobility Bed Mobility                  Transfers                      Balance                                            ADL Overall ADL's : Needs assistance/impaired Eating/Feeding: Independent   Grooming: Set up   Upper Body Bathing: Supervision/ safety;Sitting   Lower Body Bathing: Supervison/ safety;Sit to/from stand   Upper Body Dressing : Set up;Sitting   Lower Body Dressing: Minimal assistance;Sit to/from stand   Toilet Transfer: Supervision/safety;BSC;RW   Toileting- Water quality scientist and Hygiene: Minimal assistance;Sit to/from stand       Functional mobility during ADLs: Supervision/safety;Rolling walker General ADL Comments: Patient fully dressed upon arrival; reports her husband provided min A to get pants/underwear over hips but she had no difficulty doing the rest, even with NWB status RLE. Patient reports she has been toileting without difficulty. Reviewed walk-in shower transfer technique with patient and husband and they verbalized understanding. Pt declined to practice. Husband will assist her as needed. Both are excited to go home today.      Vision      Perception     Praxis      Pertinent Vitals/Pain Pain Assessment: No/denies pain     Hand Dominance     Extremity/Trunk Assessment Upper Extremity Assessment Upper Extremity Assessment: Overall WFL for tasks assessed   Lower Extremity Assessment Lower Extremity Assessment: Defer to PT evaluation       Communication Communication Communication: No difficulties   Cognition Arousal/Alertness: Awake/alert Behavior During Therapy: WFL for tasks assessed/performed Overall Cognitive Status: Within Functional Limits for tasks assessed                     General Comments       Exercises       Shoulder Instructions      Home Living Family/patient expects to be discharged to:: Private residence Living Arrangements: Spouse/significant other Available Help at Discharge: Family;Available 24 hours/day Type of Home: House Home Access: Stairs to enter [employer] of Steps: 3 Entrance Stairs-Rails: None Home Layout: Two level;Bed/bath upstairs Alternate Level Stairs-Number of Steps: 13 steps Alternate Level Stairs-Rails: Right Bathroom Shower/Tub: Occupational psychologist: Standard     Home Equipment: Environmental consultant - 2 wheels;Crutches;Hand held shower head;Shower seat - built in;Bedside commode          Prior Functioning/Environment Level of Independence: Independent        Comments: Loves to  travel.     OT Diagnosis: Acute pain;Generalized weakness   OT Problem List: Decreased strength;Decreased activity tolerance;Pain;Decreased knowledge of precautions;Decreased knowledge of use of DME or AE   OT Treatment/Interventions:      OT Goals(Current goals can be found in the care plan section) Acute Rehab OT Goals Patient Stated Goal: to go home today OT Goal Formulation: All assessment and education complete, DC therapy  OT Frequency:     Barriers to D/C:            Co-evaluation              End of Session    Activity  Tolerance: Patient tolerated treatment well Patient left: in bed;with call bell/phone within reach;with family/visitor present   Time: 0852-0901 OT Time Calculation (min): 9 min Charges:  OT General Charges $OT Visit: 1 Procedure OT Evaluation $Initial OT Evaluation Tier I: 1 Procedure G-Codes: OT G-codes **NOT FOR INPATIENT CLASS** Functional Assessment Tool Used: clinical reasoning Functional Limitation: Self care Self Care Current Status [id number]): At least 20 percent but less than 40 percent impaired, limited or restricted Self Care Goal Status [id number]): At least 20 percent but less than 40 percent impaired, limited or restricted Self Care Discharge Status [phone]): At least 20 percent but less than 40 percent impaired, limited or restricted  Maxime Beckner A 12/28/2014, 11:20 AM

## 2014-12-28 NOTE — Progress Notes (Signed)
Pt doing well. Pt and husband given D/C instructions with Rx's, verbal understanding was provided. Pt's incision is clean and dry with no sign of infection. Pt's IV was removed prior to D/C. Pt D/C'd home via wheelchair @ 1125 per MD order. Pt is stable @ D/C and has no other needs at this time. Holli Humbles, RN

## 2014-12-28 NOTE — Progress Notes (Addendum)
Patient ID: [name], female   DOB: 23-Jan-1946, 69 y.o.   MRN: [id number]    Subjective: 1 Day Post-Op Procedure(s) (LRB): RIGHT SACROILIAC JOINT FUSION (Right) Patient reports pain as 3 on 0-10 scale.   Denies CP or SOB.  Voiding without difficulty. Positive flatus. Objective: Vital signs in last 24 hours: Temp:  [97.7 F (36.5 C)-98.6 F (37 C)] 98.6 F (37 C) (11/10 0400) Pulse Rate:  [73-93] 93 (11/10 0400) Resp:  [9-20] 20 (11/10 0400) BP: (110-149)/(49-72) 129/50 mmHg (11/10 0400) SpO2:  [68 %-100 %] 98 % (11/10 0400)  Intake/Output from previous day: 11/09 0701 - 11/10 0700 In: 2375 [P.O.:420; I.V.:1900; IV Piggyback:55] Out: 25 [Blood:25] Intake/Output this shift:    Labs: No results for input(s): HGB in the last 72 hours. No results for input(s): WBC, RBC, HCT, PLT in the last 72 hours. No results for input(s): NA, K, CL, CO2, BUN, CREATININE, GLUCOSE, CALCIUM in the last 72 hours. No results for input(s): LABPT, INR in the last 72 hours.  Physical Exam: Neurologically intact ABD soft Neurovascular intact Sensation intact distally Intact pulses distally Dorsiflexion/Plantar flexion intact Incision: no drainage Compartment soft  Assessment/Plan: 1 Day Post-Op Procedure(s) (LRB): RIGHT SACROILIAC JOINT FUSION (Right) Advance diet Up with therapy  DC today after PT  [place], Darla Lesches for Dr. Melina Schools [facility] [phone] 12/28/2014, 7:45 AM    Doing well  Significant pain relief Ok for d/c to home F/u 2 weeks

## 2014-12-28 NOTE — Progress Notes (Signed)
Physical Therapy Treatment Patient Details Name: [name] MRN: [id number] DOB: 1945-12-26 Today's Date: 12/28/2014    History of Present Illness Patient is a 69 y/o female s/p right SI joint fusion. PMH includes HTN, hypothyroidism.    PT Comments    Pt able to perform stair gait with husband present for education.  Feel pt is ready for D/C to home from PT perspective.    Follow Up Recommendations  No PT follow up;Supervision for mobility/OOB     Equipment Recommendations  None recommended by PT    Recommendations for Other Services       Precautions / Restrictions Precautions Precautions: Fall Restrictions Weight Bearing Restrictions: Yes RLE Weight Bearing: Non weight bearing    Mobility  Bed Mobility Overal bed mobility: Modified Independent                Transfers Overall transfer level: Needs assistance Equipment used: Rolling walker (2 wheeled) Transfers: Sit to/from Stand Sit to Stand: Supervision         General transfer comment: S only.  pt demonstrates good use of UEs for safety.    Ambulation/Gait Ambulation/Gait assistance: Min guard Ambulation Distance (Feet): 25 Feet (x2) Assistive device: Rolling walker (2 wheeled) Gait Pattern/deviations: Step-to pattern     General Gait Details: pt demonstrates good use of RW and discussed with pt adjusting height of RW.   Stairs Stairs: Yes Stairs assistance: Min guard Stair Management: One rail Left;Step to pattern;Forwards;With crutches Number of Stairs: 10 General stair comments: Used L rail and R crutch to ascend stairs.  pt fatigues after flight of stairs, but was able to complete.  Husband present for education.    Wheelchair Mobility    Modified Rankin (Stroke Patients Only)       Balance Overall balance assessment: Needs assistance Sitting-balance support: No upper extremity supported;Feet supported Sitting balance-Leahy Scale: Good     Standing balance support: During  functional activity Standing balance-Leahy Scale: Poor                      Cognition Arousal/Alertness: Awake/alert Behavior During Therapy: WFL for tasks assessed/performed Overall Cognitive Status: Within Functional Limits for tasks assessed                      Exercises      General Comments General comments (skin integrity, edema, etc.): Spouse present and supportive      Pertinent Vitals/Pain Pain Assessment: No/denies pain    Home Living Family/patient expects to be discharged to:: Private residence Living Arrangements: Spouse/significant other Available Help at Discharge: Family;Available 24 hours/day Type of Home: House Home Access: Stairs to enter Entrance Stairs-Rails: None Home Layout: Two level;Bed/bath upstairs Home Equipment: Walker - 2 wheels;Crutches;Hand held shower head;Shower seat - built in;Bedside commode      Prior Function Level of Independence: Independent      Comments: Loves to travel.    PT Goals (current goals can now be found in the care plan section) Acute Rehab PT Goals Patient Stated Goal: to go home today PT Goal Formulation: With patient/family Time For Goal Achievement: 01/10/15 Potential to Achieve Goals: Good Progress towards PT goals: Progressing toward goals    Frequency  Min 5X/week    PT Plan Current plan remains appropriate    Co-evaluation             End of Session Equipment Utilized During Treatment: Gait belt Activity Tolerance: Patient limited by fatigue Patient  left: in chair;with call bell/phone within reach;with family/visitor present     Time: [id number] PT Time Calculation (min) (ACUTE ONLY): 26 min  Charges:  $Gait Training: 23-37 mins                    G CodesCatarina Hartshorn, [place] 12/28/2014, 11:30 AM

## 2014-12-29 NOTE — Discharge Summary (Signed)
Physician Discharge Summary  Patient ID: [name] MRN: [id number] DOB/AGE: February 08, 1946 69 y.o.  Admit date: 12/27/2014 Discharge date: 12/29/2014  Admission Diagnoses:  Right SI pain  Discharge Diagnoses:  Active Problems:   SI (sacroiliac) pain   Past Medical History  Diagnosis Date  . Hypertension   . Hypothyroidism   . Arthritis   . Pneumonia     hx    Surgeries: Procedure(s): RIGHT SACROILIAC JOINT FUSION on 12/27/2014   Consultants (if any):    Discharged Condition: Improved  Hospital Course: [name] is an 69 y.o. female who was admitted 12/27/2014 with a diagnosis of Right SI pain and went to the operating room on 12/27/2014 and underwent the above named procedures.  The pt was discharged on 12/28/14.  Her hospital copurse was uneventful.  She was given perioperative antibiotics:  Anti-infectives    Start     Dose/Rate Route Frequency Ordered Stop   12/27/14 1600  ceFAZolin (ANCEF) IVPB 1 g/50 mL premix     1 g 100 mL/hr over 30 Minutes Intravenous Every 8 hours 12/27/14 1217 12/27/14 2354   12/27/14 0714  ceFAZolin (ANCEF) IVPB 2 g/50 mL premix     2 g 100 mL/hr over 30 Minutes Intravenous 30 min pre-op 12/27/14 0714 12/27/14 0852   12/27/14 0534  ceFAZolin (ANCEF) 2-3 GM-% IVPB SOLR    Comments:  Sharmaine Base   : cabinet override      12/27/14 0534 12/27/14 1744    .  She was given sequential compression devices, early ambulation, and TED for DVT prophylaxis.  She benefited maximally from the hospital stay and there were no complications.    Recent vital signs:  Filed Vitals:   12/28/14 0802  BP: 153/55  Pulse: 93  Temp: 98.5 F (36.9 C)  Resp: 18    Recent laboratory studies:  Lab Results  Component Value Date   HGB 15.0 12/20/2014   HGB 8.9* 09/11/2011   HGB 10.1* 09/10/2011   Lab Results  Component Value Date   WBC 4.9 12/20/2014   PLT 201 12/20/2014   Lab Results  Component Value Date   INR 0.96 09/03/2011   Lab  Results  Component Value Date   NA 140 12/20/2014   K 3.9 12/20/2014   CL 100* 12/20/2014   CO2 27 12/20/2014   BUN 9 12/20/2014   CREATININE 0.78 12/20/2014   GLUCOSE 101* 12/20/2014    Discharge Medications:     Medication List    STOP taking these medications        ibuprofen 200 MG tablet  Commonly known as:  ADVIL,MOTRIN     oxyCODONE 5 MG immediate release tablet  Commonly known as:  Oxy IR/ROXICODONE     THERMACARE BACK/HIP Misc      TAKE these medications        aspirin 81 MG tablet  Take 81 mg by mouth at bedtime.     baclofen 10 MG tablet  Commonly known as:  LIORESAL  Take 10 mg by mouth 3 (three) times daily as needed for muscle spasms.     CALTRATE 600+D 600-400 MG-UNIT tablet  Generic drug:  Calcium Carbonate-Vitamin D  Take 2 tablets by mouth daily.     Co Q 10 100 MG Caps  Take 1 capsule by mouth every morning.     gabapentin 300 MG capsule  Commonly known as:  NEURONTIN  Take 900 mg by mouth at bedtime.     Krill Oil  1000 MG Caps  Take 1 capsule by mouth at bedtime.     levothyroxine 112 MCG tablet  Commonly known as:  SYNTHROID, LEVOTHROID  Take 112 mcg by mouth daily before breakfast.     lisinopril-hydrochlorothiazide 20-12.5 MG tablet  Commonly known as:  PRINZIDE,ZESTORETIC  Take 1 tablet by mouth daily with breakfast.     MILK THISTLE PO  Take 1 capsule by mouth every morning.     multivitamin with minerals tablet  Take 1 tablet by mouth at bedtime.     ondansetron 4 MG tablet  Commonly known as:  ZOFRAN  Take 1 tablet (4 mg total) by mouth every 8 (eight) hours as needed for nausea or vomiting.     oxyCODONE-acetaminophen 10-325 MG tablet  Commonly known as:  PERCOCET  Take 1 tablet by mouth every 6 (six) hours as needed for pain.     polyethylene glycol powder powder  Commonly known as:  GLYCOLAX  Take 17 g by mouth daily.     RED YEAST RICE PO  Take 1 capsule by mouth every morning.     temazepam 30 MG capsule   Commonly known as:  RESTORIL  Take 30 mg by mouth at bedtime.     Vitamin D3 5000 UNITS Tabs  Take 1 tablet by mouth at bedtime.        Diagnostic Studies: Dg Si Joints  12/27/2014  CLINICAL DATA:  Right sacroiliac joint fusion EXAM: BILATERAL SACROILIAC JOINTS - 3+ VIEW; DG C-ARM 61-120 MIN COMPARISON:  None. Fluoroscopy Time:  3 minutes and 3 seconds Number of Acquired Images:  3 FINDINGS: Multiple surgical screws traverse horizontally the right sacroiliac joint. IMPRESSION: Postsurgical change Electronically Signed   By: Skipper Cliche M.D.   On: 12/27/2014 10:13   Dg C-arm 1-60 Min  12/27/2014  CLINICAL DATA:  Right sacroiliac joint fusion EXAM: BILATERAL SACROILIAC JOINTS - 3+ VIEW; DG C-ARM 61-120 MIN COMPARISON:  None. Fluoroscopy Time:  3 minutes and 3 seconds Number of Acquired Images:  3 FINDINGS: Multiple surgical screws traverse horizontally the right sacroiliac joint. IMPRESSION: Postsurgical change Electronically Signed   By: Skipper Cliche M.D.   On: 12/27/2014 10:13    Disposition: 01-Home or Self Care        Follow-up Information    Follow up with Dahlia Bailiff, MD. Schedule an appointment as soon as possible for a visit in 2 weeks.   Specialty:  Orthopedic Surgery   Why:  For suture removal, If symptoms worsen, For wound re-check   Contact information:   [address] [place] [address] [phone]        Signed: Valinda Hoar 12/29/2014, 1:28 PM

## 2015-01-01 ENCOUNTER — Encounter (HOSPITAL_COMMUNITY): Payer: Self-pay | Admitting: Orthopedic Surgery

## 2015-11-14 ENCOUNTER — Other Ambulatory Visit: Payer: Self-pay | Admitting: Obstetrics and Gynecology

## 2015-11-14 DIAGNOSIS — Z1231 Encounter for screening mammogram for malignant neoplasm of breast: Secondary | ICD-10-CM

## 2015-11-23 ENCOUNTER — Ambulatory Visit
Admission: RE | Admit: 2015-11-23 | Discharge: 2015-11-23 | Disposition: A | Discharge status: Home / Self Care | Payer: Medicare Other | Source: Ambulatory Visit | Attending: Obstetrics and Gynecology | Admitting: Obstetrics and Gynecology

## 2015-11-23 DIAGNOSIS — Z1231 Encounter for screening mammogram for malignant neoplasm of breast: Secondary | ICD-10-CM

## 2016-08-12 IMAGING — RF DG SI JOINTS 3+V
1 series · 3 of 3 positions shown · non-contrast
Comparison: None.

Fluoroscopy Time:  3 minutes and 3 seconds

Number of Acquired Images:  3

CLINICAL DATA: Right sacroiliac joint fusion

EXAM:
BILATERAL SACROILIAC JOINTS - 3+ VIEW; DG C-ARM 61-120 MIN

[Series 1: run · 3 of 3 slices shown]
[im 1/3]
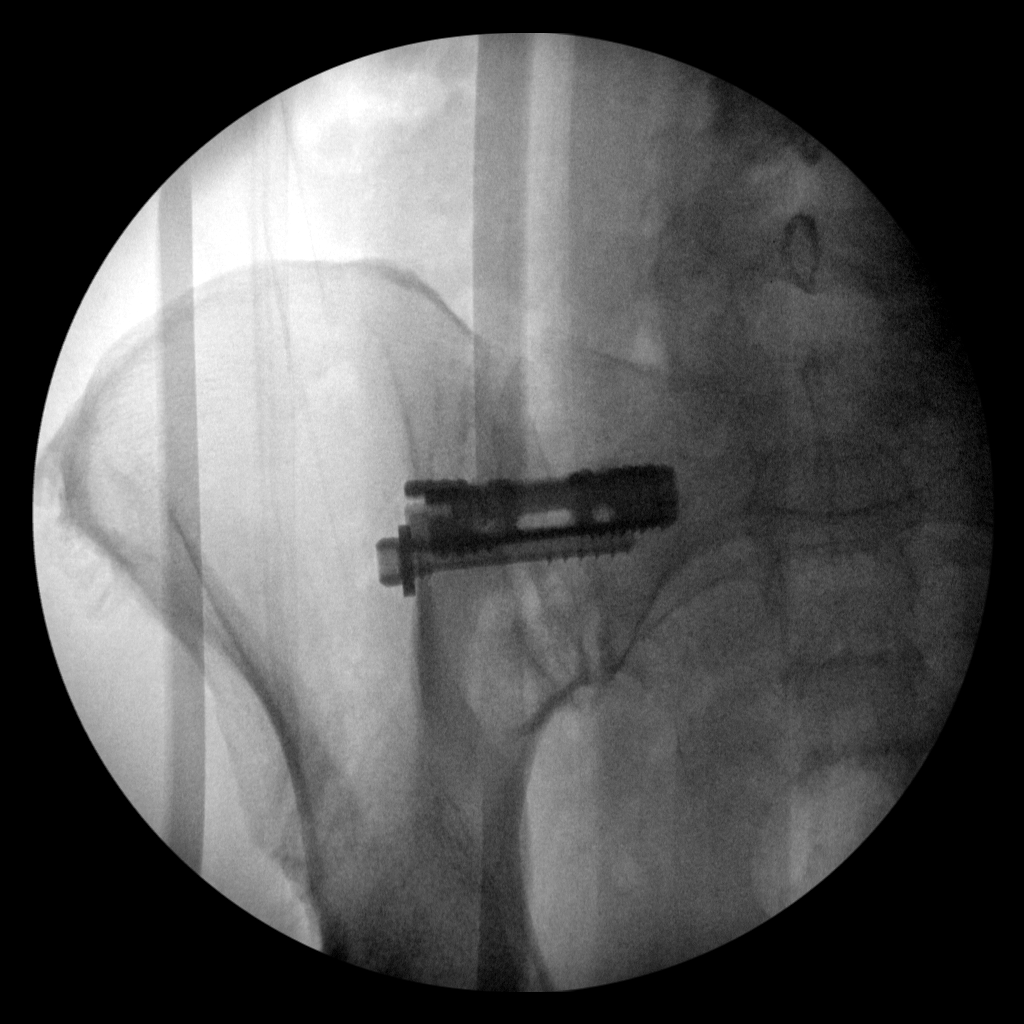
[im 2/3]
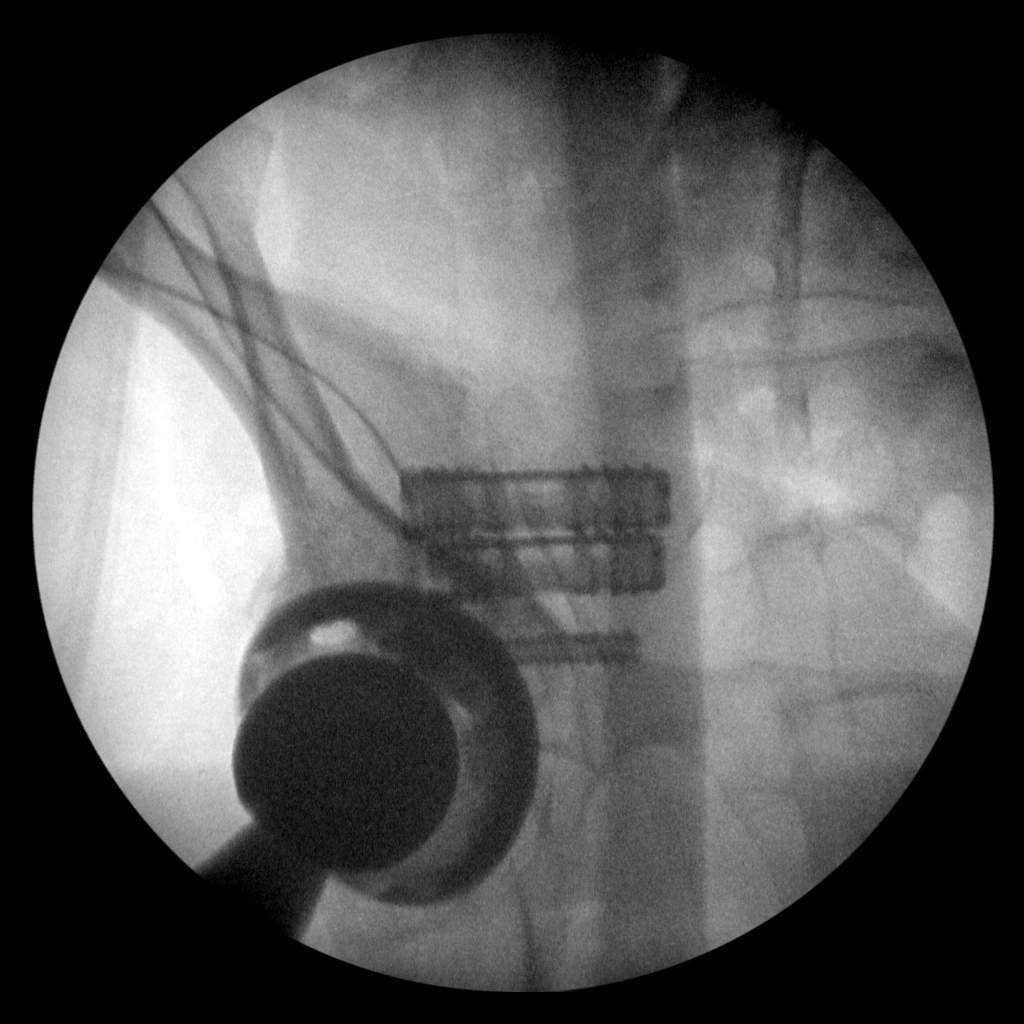
[im 3/3]
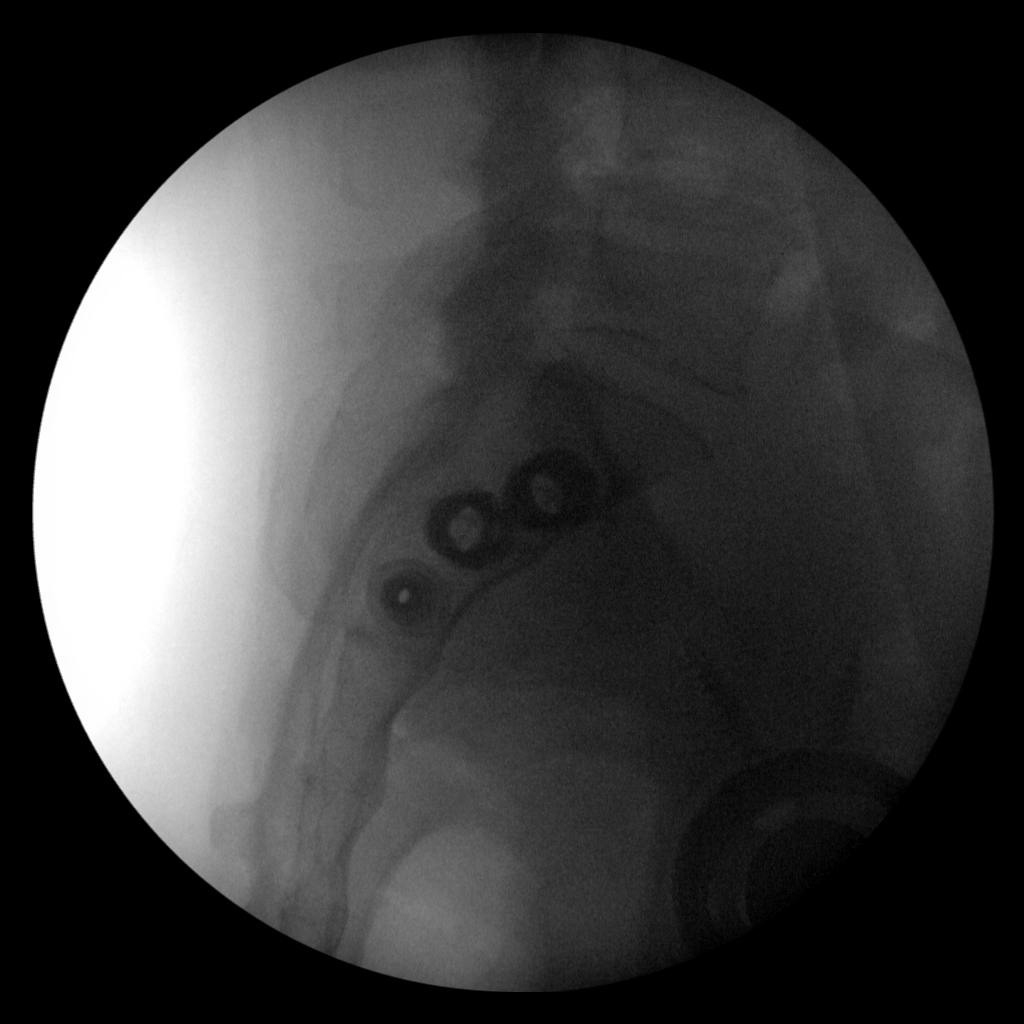

[3 of 3 positions shown; findings below may reference images not displayed]

FINDINGS: Multiple surgical screws traverse horizontally the right sacroiliac
joint.
IMPRESSION: Postsurgical change

## 2016-10-13 ENCOUNTER — Other Ambulatory Visit: Payer: Self-pay | Admitting: Obstetrics and Gynecology

## 2016-10-13 DIAGNOSIS — Z1231 Encounter for screening mammogram for malignant neoplasm of breast: Secondary | ICD-10-CM

## 2016-11-24 ENCOUNTER — Ambulatory Visit
Admission: RE | Admit: 2016-11-24 | Discharge: 2016-11-24 | Disposition: A | Discharge status: Home / Self Care | Payer: Medicare Other | Source: Ambulatory Visit | Attending: Obstetrics and Gynecology | Admitting: Obstetrics and Gynecology

## 2016-11-24 DIAGNOSIS — Z1231 Encounter for screening mammogram for malignant neoplasm of breast: Secondary | ICD-10-CM

## 2017-04-13 DIAGNOSIS — M940 Chondrocostal junction syndrome [Tietze]: Secondary | ICD-10-CM

## 2017-04-13 HISTORY — DX: Chondrocostal junction syndrome (tietze): M94.0

## 2017-05-11 NOTE — Pre-Procedure Instructions (Signed)
[name]  05/11/2017      [facility] - [address], [place] [address] [place] [address] Phone: [phone] Fax: [phone]    Your procedure is scheduled on 05/14/2017.  Report to [facility] Admitting at 1100 A.M.  Call this number if you have problems the morning of surgery:  [phone]   Remember:  Do not eat food or drink liquids after midnight.   Continue all medications as directed by your physician except follow these medication instructions before surgery below   Take these medicines the morning of surgery with A SIP OF WATER: Cetirizine (Zyrtec) Gabapentin (Neurontin) Guaifenesin (Mucinex) Levothyroxine (Synthroid) Liothyronine (Cytomel)  7 days prior to surgery STOP taking any Aspirin(unless otherwise instructed by your surgeon), Aleve, Naproxen, Ibuprofen, Motrin, Advil, Goody's, BC's, all herbal medications, fish oil, and all vitamins  Follow your doctors instructions regarding your Aspirin.  If no instructions were given by your doctor, then you will need to call the prescribing office office to get instructions.       Do not wear jewelry, make-up or nail polish.  Do not wear lotions, powders, or perfumes, or deodorant.  Do not shave 48 hours prior to surgery.    Do not bring valuables to the hospital.  [facility] is not responsible for any belongings or valuables.  Hearing aids, eyelgasses, contacts, dentures or bridgework may not be worn into surgery.  Leave your suitcase in the car.  After surgery it may be brought to your room.  For patients admitted to the hospital, discharge time will be determined by your treatment team.  Patients discharged the day of surgery will not be allowed to drive home.   Name and phone number of your driver:    Special instructions:   [place] [facility]- Preparing For Surgery  Before surgery, you can play an important role. Because skin is not  sterile, your skin needs to be as free of germs as possible. You can reduce the number of germs on your skin by washing with CHG (chlorahexidine gluconate) Soap before surgery.  CHG is an antiseptic cleaner which kills germs and bonds with the skin to continue killing germs even after washing.  Please do not use if you have an allergy to CHG or antibacterial soaps. If your skin becomes reddened/irritated stop using the CHG.  Do not shave (including legs and underarms) for at least 48 hours prior to first CHG shower. It is OK to shave your face.  Please follow these instructions carefully.   1. Shower the NIGHT BEFORE SURGERY and the MORNING OF SURGERY with CHG.   2. If you chose to wash your hair, wash your hair first as usual with your normal shampoo.  3. After you shampoo, rinse your hair and body thoroughly to remove the shampoo.  4. Use CHG as you would any other liquid soap. You can apply CHG directly to the skin and wash gently with a scrungie or a clean washcloth.   5. Apply the CHG Soap to your body ONLY FROM THE NECK DOWN.  Do not use on open wounds or open sores. Avoid contact with your eyes, ears, mouth and genitals (private parts). Wash Face and genitals (private parts)  with your normal soap.  6. Wash thoroughly, paying special attention to the area where your surgery will be performed.  7. Thoroughly rinse your body with warm water from the neck  down.  8. DO NOT shower/wash with your normal soap after using and rinsing off the CHG Soap.  9. Pat yourself dry with a CLEAN TOWEL.  10. Wear CLEAN PAJAMAS to bed the night before surgery, wear comfortable clothes the morning of surgery  11. Place CLEAN SHEETS on your bed the night of your first shower and DO NOT SLEEP WITH PETS.    Day of Surgery: Shower as stated above. Do not apply any deodorants/lotions. Please wear clean clothes to the hospital/surgery center.      Please read over the following fact sheets that you  were given.

## 2017-05-12 ENCOUNTER — Other Ambulatory Visit: Payer: Self-pay

## 2017-05-12 ENCOUNTER — Encounter (HOSPITAL_COMMUNITY)
Admission: RE | Admit: 2017-05-12 | Discharge: 2017-05-12 | Disposition: A | Discharge status: Home / Self Care | Payer: Medicare Other | Source: Ambulatory Visit | Attending: Orthopedic Surgery | Admitting: Orthopedic Surgery

## 2017-05-12 ENCOUNTER — Encounter (HOSPITAL_COMMUNITY): Payer: Self-pay

## 2017-05-12 DIAGNOSIS — M19011 Primary osteoarthritis, right shoulder: Secondary | ICD-10-CM | POA: Insufficient documentation

## 2017-05-12 DIAGNOSIS — Z01818 Encounter for other preprocedural examination: Secondary | ICD-10-CM

## 2017-05-12 DIAGNOSIS — M75101 Unspecified rotator cuff tear or rupture of right shoulder, not specified as traumatic: Secondary | ICD-10-CM

## 2017-05-12 LAB — BASIC METABOLIC PANEL
Anion gap: 12 (ref 5–15)
BUN: 11 mg/dL (ref 6–20)
CHLORIDE: 98 mmol/L — AB (ref 101–111)
CO2: 27 mmol/L (ref 22–32)
Calcium: 9.1 mg/dL (ref 8.9–10.3)
Creatinine, Ser: 0.84 mg/dL (ref 0.44–1.00)
GFR calc Af Amer: >60 mL/min (ref >60)
GLUCOSE: 83 mg/dL (ref 65–99)
POTASSIUM: 3.6 mmol/L (ref 3.5–5.1)
Sodium: 137 mmol/L (ref 135–145)

## 2017-05-12 LAB — CBC
HEMATOCRIT: 44.7 % (ref 36.0–46.0)
Hemoglobin: 15.2 g/dL — ABNORMAL HIGH (ref 12.0–15.0)
MCH: 30.5 pg (ref 26.0–34.0)
MCHC: 34 g/dL (ref 30.0–36.0)
MCV: 89.8 fL (ref 78.0–100.0)
Platelets: 219 10*3/uL (ref 150–400)
RBC: 4.98 MIL/uL (ref 3.87–5.11)
RDW: 13 % (ref 11.5–15.5)
WBC: 9.2 10*3/uL (ref 4.0–10.5)

## 2017-05-12 LAB — SURGICAL PCR SCREEN
MRSA, PCR: NEGATIVE
STAPHYLOCOCCUS AUREUS: NEGATIVE

## 2017-05-12 NOTE — Progress Notes (Signed)
PCP is Dr. Delaine Lame  Denies ever seeing a cardiologist.  Denies ever having a card cath, stress test, or echo. Denies chest pain, cough, or fever. States she was told to stop taking aspirin 5 days prior to her surgery.  Request sent for EKG Tracing from [employer]

## 2017-05-13 MED ORDER — TRANEXAMIC ACID 1000 MG/10ML IV SOLN
1000.0000 mg | INTRAVENOUS | Status: AC
  Administered 2017-05-14: 1000 mg via INTRAVENOUS
  Filled 2017-05-13: qty 1100

## 2017-05-13 NOTE — Progress Notes (Signed)
Anesthesia chart review: Patient is a 72 year old scheduled for right reverse shoulder arthroplasty on 05/14/2017 by Dr. Justice Britain.  Special needs list: Arthrex intrascalene block anesthesia.  History includes never smoker, hypertension, hypothyroidism, arthritis, headaches, bladder suspension, tonsillectomy, appendectomy, right SI joint fusion 12/27/2014, right THA 09/08/2011, left THA (s/p revision 09/21/00, 11/12/00), right TKA 09/15/07.  BMI is consistent with obesity.  PCP is Kristopher Glee., MD (see [place]).  Meds include aspirin 81 mg (holding 5 days prior to surgery), Zyrtec, Neurontin, Krill oil, levothyroxine, Cytomel, lisinopril-HCTZ, red yeast rice, Restoril. She is on several herbal supplements and was instructed to hold for surgery.  BP 122/61   Pulse 78   Temp (!) 36.4 C   Resp 20   Ht 5\' 4"  (1.626 m)   SpO2 100%   BMI 32.85 kg/m   EKG 04/13/17 ([place]; done during  [facility] visit for acute costochondritis): TRACING REQUESTED, but according to Result Narrative: Ventricular Rate 72BPM  Atrial Rate72BPM  P-R Interval 154 ms QRS Duration 32ms Q-T Interval 378 ms QTC413 ms P Axis 49degrees  R Axis -12 degrees  T Axis 32degrees  Sinus rhythm Normal ECG When compared with ECG of 07-Jun-2012 22:53, Incomplete right bundle branch block is no longer present Confirmed by fellow Amjad, Aysha ([id number]) on 04/13/2017 2:35:35 PM Confirmed by HAISTY, DR. W. K. ([id number]) on 04/13/2017 4:32:32 PM   Preoperative labs noted.   Based on currently  available information, I would anticipate that she can proceed as planned if no acute changes. I re-requested EKG tracing.  George Hugh [facility] Stay [facility]/Anesthesiology Phone [phone] 05/13/2017 10:38 AM

## 2017-05-14 ENCOUNTER — Inpatient Hospital Stay (HOSPITAL_COMMUNITY): Payer: Medicare Other | Admitting: Certified Registered Nurse Anesthetist

## 2017-05-14 ENCOUNTER — Encounter (HOSPITAL_COMMUNITY)
Admission: RE | Disposition: A | Discharge status: Home / Self Care | Payer: Self-pay | Source: Ambulatory Visit | Attending: Orthopedic Surgery

## 2017-05-14 ENCOUNTER — Other Ambulatory Visit: Payer: Self-pay

## 2017-05-14 ENCOUNTER — Inpatient Hospital Stay (HOSPITAL_COMMUNITY): Payer: Medicare Other | Admitting: Vascular Surgery

## 2017-05-14 ENCOUNTER — Encounter (HOSPITAL_COMMUNITY): Payer: Self-pay

## 2017-05-14 ENCOUNTER — Inpatient Hospital Stay (HOSPITAL_COMMUNITY)
Admission: RE | Admit: 2017-05-14 | Discharge: 2017-05-15 | DRG: 483 | Disposition: A | Discharge status: Home / Self Care | Payer: Medicare Other | Source: Ambulatory Visit | Attending: Orthopedic Surgery | Admitting: Orthopedic Surgery

## 2017-05-14 DIAGNOSIS — M75101 Unspecified rotator cuff tear or rupture of right shoulder, not specified as traumatic: Secondary | ICD-10-CM | POA: Diagnosis present

## 2017-05-14 DIAGNOSIS — Z981 Arthrodesis status: Secondary | ICD-10-CM

## 2017-05-14 DIAGNOSIS — Z79899 Other long term (current) drug therapy: Secondary | ICD-10-CM

## 2017-05-14 DIAGNOSIS — Z96641 Presence of right artificial hip joint: Secondary | ICD-10-CM | POA: Diagnosis present

## 2017-05-14 DIAGNOSIS — E039 Hypothyroidism, unspecified: Secondary | ICD-10-CM | POA: Diagnosis present

## 2017-05-14 DIAGNOSIS — M19011 Primary osteoarthritis, right shoulder: Principal | ICD-10-CM | POA: Diagnosis present

## 2017-05-14 DIAGNOSIS — I10 Essential (primary) hypertension: Secondary | ICD-10-CM | POA: Diagnosis present

## 2017-05-14 DIAGNOSIS — Z7982 Long term (current) use of aspirin: Secondary | ICD-10-CM | POA: Diagnosis not present

## 2017-05-14 DIAGNOSIS — Z96619 Presence of unspecified artificial shoulder joint: Secondary | ICD-10-CM

## 2017-05-14 HISTORY — DX: Unspecified rotator cuff tear or rupture of right shoulder, not specified as traumatic: M75.101

## 2017-05-14 HISTORY — DX: Other specific arthropathies, not elsewhere classified, right shoulder: M12.811

## 2017-05-14 HISTORY — PX: REVERSE SHOULDER ARTHROPLASTY: SHX5054

## 2017-05-14 SURGERY — ARTHROPLASTY, SHOULDER, TOTAL, REVERSE
Anesthesia: General | Site: Shoulder | Laterality: Right

## 2017-05-14 MED ORDER — FENTANYL CITRATE (PF) 100 MCG/2ML IJ SOLN
50.0000 ug | Freq: Once | INTRAMUSCULAR | Status: AC
  Administered 2017-05-14: 50 ug via INTRAVENOUS

## 2017-05-14 MED ORDER — PROPOFOL 10 MG/ML IV BOLUS
INTRAVENOUS | Status: DC | PRN
  Administered 2017-05-14: 130 mg via INTRAVENOUS

## 2017-05-14 MED ORDER — CEFAZOLIN SODIUM-DEXTROSE 2-4 GM/100ML-% IV SOLN
2.0000 g | INTRAVENOUS | Status: AC
  Administered 2017-05-14: 2 g via INTRAVENOUS

## 2017-05-14 MED ORDER — ONDANSETRON HCL 4 MG PO TABS
4.0000 mg | ORAL_TABLET | Freq: Four times a day (QID) | ORAL | Status: DC | PRN
Start: 2017-05-14 — End: 2017-05-15

## 2017-05-14 MED ORDER — PROPOFOL 10 MG/ML IV BOLUS
INTRAVENOUS | Status: AC
  Filled 2017-05-14: qty 20

## 2017-05-14 MED ORDER — ROCURONIUM BROMIDE 10 MG/ML (PF) SYRINGE
PREFILLED_SYRINGE | INTRAVENOUS | Status: AC
  Filled 2017-05-14: qty 5

## 2017-05-14 MED ORDER — KETOROLAC TROMETHAMINE 15 MG/ML IJ SOLN
7.5000 mg | Freq: Four times a day (QID) | INTRAMUSCULAR | Status: DC
  Administered 2017-05-14 – 2017-05-15 (×3): 7.5 mg via INTRAVENOUS
  Filled 2017-05-14 (×3): qty 1

## 2017-05-14 MED ORDER — PHENYLEPHRINE HCL 10 MG/ML IJ SOLN
INTRAVENOUS | Status: DC | PRN
  Administered 2017-05-14: 25 ug/min via INTRAVENOUS

## 2017-05-14 MED ORDER — CHLORHEXIDINE GLUCONATE 4 % EX LIQD: 60.0000 mL | Freq: Once | CUTANEOUS | Status: DC

## 2017-05-14 MED ORDER — POLYETHYLENE GLYCOL 3350 17 G PO PACK: 17.0000 g | PACK | Freq: Every day | ORAL | Status: DC | PRN

## 2017-05-14 MED ORDER — SUGAMMADEX SODIUM 200 MG/2ML IV SOLN
INTRAVENOUS | Status: AC
  Filled 2017-05-14: qty 2

## 2017-05-14 MED ORDER — DEXAMETHASONE SODIUM PHOSPHATE 10 MG/ML IJ SOLN
INTRAMUSCULAR | Status: AC
  Filled 2017-05-14: qty 1

## 2017-05-14 MED ORDER — ASPIRIN EC 81 MG PO TBEC
81.0000 mg | DELAYED_RELEASE_TABLET | Freq: Every day | ORAL | Status: DC
  Administered 2017-05-14: 81 mg via ORAL
  Filled 2017-05-14: qty 1

## 2017-05-14 MED ORDER — LIDOCAINE HCL (CARDIAC) 20 MG/ML IV SOLN
INTRAVENOUS | Status: DC | PRN
  Administered 2017-05-14: 60 mg via INTRAVENOUS

## 2017-05-14 MED ORDER — BISACODYL 5 MG PO TBEC: 5.0000 mg | DELAYED_RELEASE_TABLET | Freq: Every day | ORAL | Status: DC | PRN

## 2017-05-14 MED ORDER — HYDROMORPHONE HCL 1 MG/ML IJ SOLN: 0.5000 mg | INTRAMUSCULAR | Status: DC | PRN

## 2017-05-14 MED ORDER — METOCLOPRAMIDE HCL 5 MG/ML IJ SOLN: 5.0000 mg | Freq: Three times a day (TID) | INTRAMUSCULAR | Status: DC | PRN

## 2017-05-14 MED ORDER — LACTATED RINGERS IV SOLN
INTRAVENOUS | Status: DC
  Administered 2017-05-14: 18:00:00 via INTRAVENOUS

## 2017-05-14 MED ORDER — TEMAZEPAM 15 MG PO CAPS
30.0000 mg | ORAL_CAPSULE | Freq: Every evening | ORAL | Status: DC | PRN
  Administered 2017-05-14: 30 mg via ORAL
  Filled 2017-05-14: qty 2

## 2017-05-14 MED ORDER — ASPIRIN 81 MG PO TABS: 81.0000 mg | ORAL_TABLET | Freq: Every day | ORAL | Status: DC

## 2017-05-14 MED ORDER — ACETAMINOPHEN 500 MG PO TABS
1000.0000 mg | ORAL_TABLET | Freq: Four times a day (QID) | ORAL | Status: DC
  Administered 2017-05-14 – 2017-05-15 (×3): 1000 mg via ORAL
  Filled 2017-05-14 (×4): qty 2

## 2017-05-14 MED ORDER — HYDROMORPHONE HCL 2 MG PO TABS: 2.0000 mg | ORAL_TABLET | ORAL | Status: DC | PRN

## 2017-05-14 MED ORDER — MAGNESIUM CITRATE PO SOLN: 1.0000 | Freq: Once | ORAL | Status: DC | PRN

## 2017-05-14 MED ORDER — GABAPENTIN 300 MG PO CAPS
600.0000 mg | ORAL_CAPSULE | Freq: Two times a day (BID) | ORAL | Status: DC
  Administered 2017-05-14: 600 mg via ORAL
  Filled 2017-05-14 (×2): qty 2

## 2017-05-14 MED ORDER — ACETAMINOPHEN 325 MG PO TABS: 325.0000 mg | ORAL_TABLET | Freq: Four times a day (QID) | ORAL | Status: DC | PRN

## 2017-05-14 MED ORDER — MIDAZOLAM HCL 2 MG/2ML IJ SOLN
INTRAMUSCULAR | Status: AC
  Administered 2017-05-14: 1 mg via INTRAVENOUS
  Filled 2017-05-14: qty 2

## 2017-05-14 MED ORDER — LACTATED RINGERS IV SOLN
INTRAVENOUS | Status: DC
  Administered 2017-05-14: 11:00:00 via INTRAVENOUS

## 2017-05-14 MED ORDER — DEXAMETHASONE SODIUM PHOSPHATE 10 MG/ML IJ SOLN
INTRAMUSCULAR | Status: DC | PRN
  Administered 2017-05-14: 10 mg via INTRAVENOUS

## 2017-05-14 MED ORDER — SUGAMMADEX SODIUM 200 MG/2ML IV SOLN
INTRAVENOUS | Status: DC | PRN
  Administered 2017-05-14: 200 mg via INTRAVENOUS

## 2017-05-14 MED ORDER — MENTHOL 3 MG MT LOZG: 1.0000 | LOZENGE | OROMUCOSAL | Status: DC | PRN

## 2017-05-14 MED ORDER — HYDROCHLOROTHIAZIDE 25 MG PO TABS
25.0000 mg | ORAL_TABLET | Freq: Every day | ORAL | Status: DC
  Administered 2017-05-15: 25 mg via ORAL
  Filled 2017-05-14: qty 1

## 2017-05-14 MED ORDER — DOCUSATE SODIUM 100 MG PO CAPS
100.0000 mg | ORAL_CAPSULE | Freq: Two times a day (BID) | ORAL | Status: DC
  Administered 2017-05-14 – 2017-05-15 (×2): 100 mg via ORAL
  Filled 2017-05-14 (×2): qty 1

## 2017-05-14 MED ORDER — FENTANYL CITRATE (PF) 250 MCG/5ML IJ SOLN
INTRAMUSCULAR | Status: AC
  Filled 2017-05-14: qty 5

## 2017-05-14 MED ORDER — ONDANSETRON HCL 4 MG/2ML IJ SOLN: 4.0000 mg | Freq: Once | INTRAMUSCULAR | Status: DC | PRN

## 2017-05-14 MED ORDER — ROCURONIUM BROMIDE 100 MG/10ML IV SOLN
INTRAVENOUS | Status: DC | PRN
  Administered 2017-05-14: 50 mg via INTRAVENOUS

## 2017-05-14 MED ORDER — CEFAZOLIN SODIUM-DEXTROSE 2-4 GM/100ML-% IV SOLN
INTRAVENOUS | Status: AC
  Filled 2017-05-14: qty 100

## 2017-05-14 MED ORDER — LEVOTHYROXINE SODIUM 88 MCG PO TABS
88.0000 ug | ORAL_TABLET | Freq: Every day | ORAL | Status: DC
  Administered 2017-05-15: 88 ug via ORAL
  Filled 2017-05-14 (×2): qty 1

## 2017-05-14 MED ORDER — ONDANSETRON HCL 4 MG/2ML IJ SOLN
INTRAMUSCULAR | Status: AC
  Filled 2017-05-14: qty 2

## 2017-05-14 MED ORDER — PHENOL 1.4 % MT LIQD: 1.0000 | OROMUCOSAL | Status: DC | PRN

## 2017-05-14 MED ORDER — METOCLOPRAMIDE HCL 5 MG PO TABS: 5.0000 mg | ORAL_TABLET | Freq: Three times a day (TID) | ORAL | Status: DC | PRN

## 2017-05-14 MED ORDER — FENTANYL CITRATE (PF) 100 MCG/2ML IJ SOLN: 25.0000 ug | INTRAMUSCULAR | Status: DC | PRN

## 2017-05-14 MED ORDER — LIOTHYRONINE SODIUM 5 MCG PO TABS
5.0000 ug | ORAL_TABLET | Freq: Every day | ORAL | Status: DC
  Administered 2017-05-15: 5 ug via ORAL
  Filled 2017-05-14: qty 1

## 2017-05-14 MED ORDER — 0.9 % SODIUM CHLORIDE (POUR BTL) OPTIME
TOPICAL | Status: DC | PRN
  Administered 2017-05-14: 1000 mL

## 2017-05-14 MED ORDER — FENTANYL CITRATE (PF) 100 MCG/2ML IJ SOLN
INTRAMUSCULAR | Status: AC
  Administered 2017-05-14: 50 ug via INTRAVENOUS
  Filled 2017-05-14: qty 2

## 2017-05-14 MED ORDER — LISINOPRIL 20 MG PO TABS
20.0000 mg | ORAL_TABLET | Freq: Every day | ORAL | Status: DC
  Administered 2017-05-15: 20 mg via ORAL
  Filled 2017-05-14: qty 1

## 2017-05-14 MED ORDER — LIDOCAINE HCL (CARDIAC) 20 MG/ML IV SOLN
INTRAVENOUS | Status: AC
  Filled 2017-05-14: qty 5

## 2017-05-14 MED ORDER — MIDAZOLAM HCL 2 MG/2ML IJ SOLN
1.0000 mg | Freq: Once | INTRAMUSCULAR | Status: AC
  Administered 2017-05-14: 1 mg via INTRAVENOUS

## 2017-05-14 MED ORDER — ALUM & MAG HYDROXIDE-SIMETH 200-200-20 MG/5ML PO SUSP: 30.0000 mL | ORAL | Status: DC | PRN

## 2017-05-14 MED ORDER — LISINOPRIL-HYDROCHLOROTHIAZIDE 20-25 MG PO TABS: 1.0000 | ORAL_TABLET | Freq: Every day | ORAL | Status: DC

## 2017-05-14 MED ORDER — DIPHENHYDRAMINE HCL 12.5 MG/5ML PO ELIX: 12.5000 mg | ORAL_SOLUTION | ORAL | Status: DC | PRN

## 2017-05-14 MED ORDER — ONDANSETRON HCL 4 MG/2ML IJ SOLN
INTRAMUSCULAR | Status: DC | PRN
  Administered 2017-05-14: 4 mg via INTRAVENOUS

## 2017-05-14 MED ORDER — ONDANSETRON HCL 4 MG/2ML IJ SOLN: 4.0000 mg | Freq: Four times a day (QID) | INTRAMUSCULAR | Status: DC | PRN

## 2017-05-14 SURGICAL SUPPLY — 62 items
ADH SKN CLS APL DERMABOND .7 (GAUZE/BANDAGES/DRESSINGS) ×1
AID PSTN UNV HD RSTRNT DISP (MISCELLANEOUS) ×1
BASEPLATE GLENOID SHLDR SM (Shoulder) ×2 IMPLANT
BLADE SAW SGTL 83.5X18.5 (BLADE) ×3 IMPLANT
BSPLAT GLND SM PRFT SHLDR CA (Shoulder) ×1 IMPLANT
COVER SURGICAL LIGHT HANDLE (MISCELLANEOUS) ×3 IMPLANT
CUP SUT UNIV REVERS 36 NEUTRAL (Cup) ×2 IMPLANT
DERMABOND ADVANCED (GAUZE/BANDAGES/DRESSINGS) ×2
DERMABOND ADVANCED .7 DNX12 (GAUZE/BANDAGES/DRESSINGS) ×1 IMPLANT
DRAPE ORTHO SPLIT 77X108 STRL (DRAPES) ×6
DRAPE SURG 17X11 SM STRL (DRAPES) ×3 IMPLANT
DRAPE SURG ORHT 6 SPLT 77X108 (DRAPES) ×2 IMPLANT
DRAPE U-SHAPE 47X51 STRL (DRAPES) ×3 IMPLANT
DRSG AQUACEL AG ADV 3.5X 6 (GAUZE/BANDAGES/DRESSINGS) ×2 IMPLANT
DRSG AQUACEL AG ADV 3.5X10 (GAUZE/BANDAGES/DRESSINGS) ×3 IMPLANT
DURAPREP 26ML APPLICATOR (WOUND CARE) ×3 IMPLANT
ELECT BLADE 4.0 EZ CLEAN MEGAD (MISCELLANEOUS) ×3
ELECT CAUTERY BLADE 6.4 (BLADE) ×3 IMPLANT
ELECT REM PT RETURN 9FT ADLT (ELECTROSURGICAL) ×3
ELECTRODE BLDE 4.0 EZ CLN MEGD (MISCELLANEOUS) ×1 IMPLANT
ELECTRODE REM PT RTRN 9FT ADLT (ELECTROSURGICAL) ×1 IMPLANT
FACESHIELD WRAPAROUND (MASK) ×9 IMPLANT
FACESHIELD WRAPAROUND OR TEAM (MASK) ×3 IMPLANT
GLENOSPHERE LATERAL 36MM+4 (Shoulder) ×2 IMPLANT
GLOVE BIO SURGEON STRL SZ7.5 (GLOVE) ×3 IMPLANT
GLOVE BIO SURGEON STRL SZ8 (GLOVE) ×3 IMPLANT
GLOVE EUDERMIC 7 POWDERFREE (GLOVE) ×3 IMPLANT
GLOVE SS BIOGEL STRL SZ 7.5 (GLOVE) ×1 IMPLANT
GLOVE SUPERSENSE BIOGEL SZ 7.5 (GLOVE) ×2
GOWN STRL REUS W/ TWL LRG LVL3 (GOWN DISPOSABLE) ×1 IMPLANT
GOWN STRL REUS W/ TWL XL LVL3 (GOWN DISPOSABLE) ×2 IMPLANT
GOWN STRL REUS W/TWL LRG LVL3 (GOWN DISPOSABLE) ×3
GOWN STRL REUS W/TWL XL LVL3 (GOWN DISPOSABLE) ×6
KIT BASIN OR (CUSTOM PROCEDURE TRAY) ×3 IMPLANT
KIT TURNOVER KIT B (KITS) ×3 IMPLANT
LINER HUMERAL 36 +3MM SM (Shoulder) ×2 IMPLANT
MANIFOLD NEPTUNE II (INSTRUMENTS) ×3 IMPLANT
NDL SUT 6 .5 CRC .975X.05 MAYO (NEEDLE) IMPLANT
NDL TAPERED W/ NITINOL LOOP (MISCELLANEOUS) ×1 IMPLANT
NEEDLE MAYO TAPER (NEEDLE)
NEEDLE TAPERED W/ NITINOL LOOP (MISCELLANEOUS) ×3 IMPLANT
NS IRRIG 1000ML POUR BTL (IV SOLUTION) ×3 IMPLANT
PACK SHOULDER (CUSTOM PROCEDURE TRAY) ×3 IMPLANT
PAD ARMBOARD 7.5X6 YLW CONV (MISCELLANEOUS) ×6 IMPLANT
RESTRAINT HEAD UNIVERSAL NS (MISCELLANEOUS) ×3 IMPLANT
SCREW CENTRAL NONLOCK 6.5X20MM (Shoulder) ×2 IMPLANT
SCREW LOCK PERIPHERAL 30MM (Shoulder) ×4 IMPLANT
SET PIN UNIVERSAL REVERSE (SET/KITS/TRAYS/PACK) ×2 IMPLANT
SLING ARM FOAM STRAP LRG (SOFTGOODS) IMPLANT
SPONGE LAP 18X18 X RAY DECT (DISPOSABLE) ×3 IMPLANT
SPONGE LAP 4X18 X RAY DECT (DISPOSABLE) ×3 IMPLANT
STEM HUMERAL UNI REVERS SZ6 (Stem) ×2 IMPLANT
SUT FIBERWIRE #2 38 T-5 BLUE (SUTURE) ×9
SUT MNCRL AB 3-0 PS2 18 (SUTURE) ×3 IMPLANT
SUT MON AB 2-0 CT1 27 (SUTURE) ×3 IMPLANT
SUT MON AB 2-0 CT1 36 (SUTURE) ×3 IMPLANT
SUT VIC AB 1 CT1 27 (SUTURE) ×3
SUT VIC AB 1 CT1 27XBRD ANBCTR (SUTURE) ×1 IMPLANT
SUTURE FIBERWR #2 38 T-5 BLUE (SUTURE) ×3 IMPLANT
TOWEL OR 17X24 6PK STRL BLUE (TOWEL DISPOSABLE) ×3 IMPLANT
TOWEL OR 17X26 10 PK STRL BLUE (TOWEL DISPOSABLE) ×3 IMPLANT
WATER STERILE IRR 1000ML POUR (IV SOLUTION) ×3 IMPLANT

## 2017-05-14 NOTE — Anesthesia Postprocedure Evaluation (Signed)
Anesthesia Post Note  Patient: [name]  Procedure(s) Performed: RIGHT REVERSE SHOULDER ARTHROPLASTY (Right Shoulder)     Patient location during evaluation: PACU Anesthesia Type: General Level of consciousness: awake and alert Pain management: pain level controlled Vital Signs Assessment: post-procedure vital signs reviewed and stable Respiratory status: spontaneous breathing, nonlabored ventilation, respiratory function stable and patient connected to nasal cannula oxygen Cardiovascular status: blood pressure returned to baseline and stable Postop Assessment: no apparent nausea or vomiting Anesthetic complications: no    Last Vitals:  Vitals:   05/14/17 1608 05/14/17 1623  BP: (!) 145/60 135/71  Pulse: 66 67  Resp: 16 15  Temp:    SpO2: 95% 97%    Last Pain:  Vitals:   05/14/17 1608  TempSrc:   PainSc: 0-No pain                 Montez Hageman

## 2017-05-14 NOTE — H&P (Signed)
[name]    Chief Complaint: Right shoulder osteoarthritis, rotator cuff tear arthropathy HPI: The patient is a 72 y.o. female with end stage right shoulder rotator cuff tear arthropathy  Past Medical History:  Diagnosis Date  . Arthritis   . Headache   . Hypertension   . Hypothyroidism   . Pneumonia    hx  . Rotator cuff tear arthropathy, right     Past Surgical History:  Procedure Laterality Date  . APPENDECTOMY    . BLADDER SUSPENSION  2011  . COLONOSCOPY    . 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 right knee replacment  08  . SACROILIAC JOINT FUSION Right 12/27/2014   Procedure: RIGHT SACROILIAC JOINT FUSION;  Surgeon: Melina Schools, MD;  Location: [facility];  Service: Orthopedics;  Laterality: Right;  right sacral fusion  . TONSILLECTOMY    . TOTAL HIP ARTHROPLASTY  09/08/2011   Procedure: TOTAL HIP ARTHROPLASTY;  Surgeon: Gearlean Alf, MD;  Location: [facility];  Service: Orthopedics;  Laterality: Right;    Family History  Problem Relation Age of Onset  . Heart disease Father     Social History:  reports that she has never smoked. She has never used smokeless tobacco. She reports that she does not drink alcohol or use drugs.   Medications Prior to Admission  Medication Sig Dispense Refill  . ASHWAGANDHA PO Take 1 capsule by mouth 2 (two) times daily.    Marland Kitchen aspirin 81 MG tablet Take 81 mg by mouth at bedtime.    . Calcium Carbonate-Vitamin D (CALTRATE 600+D) 600-400 MG-UNIT tablet Take 2 tablets by mouth daily.    . Carboxymethylcellul-Glycerin ([employer] LUBRICATING/DRY EYE OP) Place 1 drop into both eyes 3 (three) times daily as needed (for dry eyes).    . cetirizine (ZYRTEC) 10 MG tablet Take 10 mg by mouth daily.    . Cholecalciferol (VITAMIN D3) 5000 UNITS TABS Take 5,000 Units by mouth at bedtime.     . Coenzyme Q10 (CO Q 10) 100 MG CAPS Take 100 mg by mouth every morning.     . gabapentin (NEURONTIN) 300 MG  capsule Take 600 mg by mouth See admin instructions. Take 600 mg by mouth in the morning around 0400 and take 600 mg by mouth at bedtime    . guaiFENesin (MUCINEX) 600 MG 12 hr tablet Take 600 mg by mouth 2 (two) times daily.    Javier Docker Oil 1000 MG CAPS Take 1,000 mg by mouth 2 (two) times daily.     Marland Kitchen levothyroxine (SYNTHROID, LEVOTHROID) 88 MCG tablet Take 88 mcg by mouth daily before breakfast.     . liothyronine (CYTOMEL) 5 MCG tablet Take 5 mcg by mouth daily.    Marland Kitchen lisinopril-hydrochlorothiazide (PRINZIDE,ZESTORETIC) 20-25 MG tablet Take 1 tablet by mouth daily with breakfast.    . MILK THISTLE PO Take 1 capsule by mouth 2 (two) times daily.     . Multiple Vitamins-Minerals (MULTIVITAMIN WITH MINERALS) tablet Take 1 tablet by mouth at bedtime.    . Probiotic Product (PROBIOTIC PO) Take 1 capsule by mouth daily.    . Red Yeast Rice 600 MG CAPS Take 600 mg by mouth every morning.     . temazepam (RESTORIL) 30 MG capsule Take 30 mg by mouth at bedtime.    . ondansetron (ZOFRAN) 4 MG tablet Take 1 tablet (4 mg total) by mouth every 8 (eight) hours as  needed for nausea or vomiting. (Patient not taking: Reported on 05/05/2017) 20 tablet 0  . oxyCODONE-acetaminophen (PERCOCET) 10-325 MG tablet Take 1 tablet by mouth every 6 (six) hours as needed for pain. (Patient not taking: Reported on 05/05/2017) 60 tablet 0  . polyethylene glycol powder (GLYCOLAX) powder Take 17 g by mouth daily. (Patient not taking: Reported on 05/05/2017) 255 g 1     Physical Exam: right shoulder with painful and restricted motion as noted at recent office visits  Vitals  Temp:  [97.5 F (36.4 C)] 97.5 F (36.4 C) (03/28 1114) Pulse Rate:  [70] 70 (03/28 1114) Resp:  [18] 18 (03/28 1114) BP: (138)/(53) 138/53 (03/28 1114) SpO2:  [99 %] 99 % (03/28 1114) Weight:  [84.4 kg (186 lb)] 84.4 kg (186 lb) (03/28 1121)  Assessment/Plan  Impression: Right shoulder osteoarthritis, rotator cuff tear arthropathy  Plan of  Action: Procedure(s): RIGHT REVERSE SHOULDER ARTHROPLASTY  Arjun Hard M Kj Imbert 05/14/2017, 12:53 PM Contact # [phone]

## 2017-05-14 NOTE — Discharge Instructions (Signed)
° °Kevin M. Supple, M.D., F.A.A.O.S. °Orthopaedic Surgery °Specializing in Arthroscopic and Reconstructive °Surgery of the Shoulder and Knee °[phone] °[address] - [place], [place] [address] - Fax [phone] ° ° °POST-OP TOTAL SHOULDER REPLACEMENT INSTRUCTIONS ° °1. Call the office at [phone] to schedule your first post-op appointment 10-14 days from the date of your surgery. ° °2. The bandage over your incision is waterproof. You may begin showering with this dressing on. You may leave this dressing on until first follow up appointment within 2 weeks. We prefer you leave this dressing in place until follow up however after 5-7 days if you are having itching or skin irritation and would like to remove it you may do so. Go slow and tug at the borders gently to break the bond the dressing has with the skin. At this point if there is no drainage it is okay to go without a bandage or you may cover it with a light guaze and tape. You can also expect significant bruising around your shoulder that will drift down your arm and into your chest wall. This is very normal and should resolve over several days. ° ° 3. Wear your sling/immobilizer at all times except to perform the exercises below or to occasionally let your arm dangle by your side to stretch your elbow. You also need to sleep in your sling immobilizer until instructed otherwise. ° °4. Range of motion to your elbow, wrist, and hand are encouraged 3-5 times daily. Exercise to your hand and fingers helps to reduce swelling you may experience. ° °5. Utilize ice to the shoulder 3-5 times minimum a day and additionally if you are experiencing pain. ° °6. Prescriptions for a pain medication and a muscle relaxant are provided for you. It is recommended that if you are experiencing pain that you pain medication alone is not controlling, add the muscle relaxant along with the pain medication which can give additional pain relief. The first 1-2 days  is generally the most severe of your pain and then should gradually decrease. As your pain lessens it is recommended that you decrease your use of the pain medications to an "as needed basis'" only and to always comply with the recommended dosages of the pain medications. ° °7. Pain medications can produce constipation along with their use. If you experience this, the use of an over the counter stool softener or laxative daily is recommended.  ° °8. For additional questions or concerns, please do not hesitate to call the office. If after hours there is an answering service to forward your concerns to the physician on call. ° °9.Pain control following an exparel block ° °To help control your post-operative pain you received a nerve block  performed with Exparel which is a long acting anesthetic (numbing agent) which can provide pain relief and sensations of numbness (and relief of pain) in the operative shoulder and arm for up to 3 days. Sometimes it provides mixed relief, meaning you may still have numbness in certain areas of the arm but can still be able to move  parts of that arm, hand, and fingers. We recommend that your prescribed pain medications  be used as needed. We do not feel it is necessary to "pre medicate" and "stay ahead" of pain.  Taking narcotic pain medications when you are not having any pain can lead to unnecessary and potentially dangerous side effects.  ° °POST-OP EXERCISES ° °Pendulum Exercises ° °Perform pendulum exercises while standing and bending at   the waist. Support your uninvolved arm on a table or chair and allow your operated arm to hang freely. Make sure to do these exercises passively - not using you shoulder muscles. ° °Repeat 20 times. Do 3 sessions per day. ° ° ° ° °

## 2017-05-14 NOTE — Progress Notes (Signed)
Pt alert and oriented x4, no complaints of pain or discomfort.  Bed in low position, call bell within reach.  Bed alarms on and functioning.  Assessment done and charted.  Will continue to monitor and do hourly rounding throughout the shift   Pt states that she is not having any pain at this time

## 2017-05-14 NOTE — Anesthesia Procedure Notes (Signed)
Anesthesia Regional Block: Interscalene brachial plexus block   Pre-Anesthetic Checklist: ,, timeout performed, Correct Patient, Correct Site, Correct Laterality, Correct Procedure, Correct Position, site marked, Risks and benefits discussed,  Surgical consent,  Pre-op evaluation,  At surgeon's request and post-op pain management  Laterality: Right  Prep: chloraprep       Needles:  Injection technique: Single-shot  Needle Type: Stimulator Needle - 40      Needle Gauge: 22     Additional Needles:   Procedures:, nerve stimulator,,,,,,,  Narrative:  Start time: 05/14/2017 12:30 PM End time: 05/14/2017 12:40 PM Injection made incrementally with aspirations every 5 mL.  Performed by: Personally  Anesthesiologist: Roberts Gaudy, MD  Additional Notes: 20 cc 0.55 Bupivacaine and 10 cc exparel injected easily

## 2017-05-14 NOTE — Anesthesia Procedure Notes (Signed)
Procedure Name: Intubation Date/Time: 05/14/2017 1:39 PM Performed by: Candis Shine, CRNA Pre-anesthesia Checklist: Patient identified, Emergency Drugs available, Suction available and Patient being monitored Patient Re-evaluated:Patient Re-evaluated prior to induction Oxygen Delivery Method: Circle System Utilized Preoxygenation: Pre-oxygenation with 100% oxygen Induction Type: IV induction Ventilation: Mask ventilation without difficulty Laryngoscope Size: Glidescope and 3 Grade View: Grade I Tube type: Oral Tube size: 7.0 mm Number of attempts: 1 Airway Equipment and Method: Stylet Placement Confirmation: ETT inserted through vocal cords under direct vision,  positive ETCO2 and breath sounds checked- equal and bilateral Secured at: 22 cm Tube secured with: Tape Dental Injury: Teeth and Oropharynx as per pre-operative assessment  Difficulty Due To: Difficulty was anticipated and Difficult Airway- due to anterior larynx

## 2017-05-14 NOTE — Op Note (Signed)
05/14/2017  3:14 PM  PATIENT:   [name]  72 y.o. female  PRE-OPERATIVE DIAGNOSIS:  Right shoulder osteoarthritis, rotator cuff tear arthropathy  POST-OPERATIVE DIAGNOSIS:  same  PROCEDURE:  R RSA #6 stem, + 3 poly, 36/+4 glenosphere, small baseplate  SURGEON:  Tehilla Coffel, Metta Clines M.D.  ASSISTANTS: Shuford pac   ANESTHESIA:   GET + ISB  EBL: 150  SPECIMEN:  none  Drains: none   PATIENT DISPOSITION:  PACU - hemodynamically stable.    PLAN OF CARE: Admit for overnight observation  Dictation# [id number]   Contact # [phone]

## 2017-05-14 NOTE — Transfer of Care (Signed)
Immediate Anesthesia Transfer of Care Note  Patient: [name]  Procedure(s) Performed: RIGHT REVERSE SHOULDER ARTHROPLASTY (Right Shoulder)  Patient Location: PACU  Anesthesia Type:GA combined with regional for post-op pain  Level of Consciousness: awake and alert   Airway & Oxygen Therapy: Patient Spontanous Breathing and Patient connected to nasal cannula oxygen  Post-op Assessment: Report given to RN and Post -op Vital signs reviewed and stable  Post vital signs: Reviewed and stable  Last Vitals:  Vitals Value Taken Time  BP 125/55 05/14/2017  3:38 PM  Temp 36.4 C 05/14/2017  3:38 PM  Pulse 70 05/14/2017  3:44 PM  Resp 21 05/14/2017  3:44 PM  SpO2 93 % 05/14/2017  3:44 PM  Vitals shown include unvalidated device data.  Last Pain:  Vitals:   05/14/17 1538  TempSrc:   PainSc: 0-No pain         Complications: No apparent anesthesia complications

## 2017-05-14 NOTE — Anesthesia Preprocedure Evaluation (Addendum)
Anesthesia Evaluation  Patient identified by MRN, date of birth, ID band Patient awake    Reviewed: Allergy & Precautions, NPO status , Patient's Chart, lab work & pertinent test results  Airway Mallampati: II  TM Distance: >3 FB Neck ROM: Full    Dental  (+) Teeth Intact, Dental Advisory Given   Pulmonary    breath sounds clear to auscultation       Cardiovascular hypertension,  Rhythm:Regular Rate:Normal     Neuro/Psych    GI/Hepatic   Endo/Other    Renal/GU      Musculoskeletal   Abdominal   Peds  Hematology   Anesthesia Other Findings   Reproductive/Obstetrics                             Anesthesia Physical Anesthesia Plan  ASA: III  Anesthesia Plan: General   Post-op Pain Management:  Regional for Post-op pain   Induction: Intravenous  PONV Risk Score and Plan: Ondansetron and Dexamethasone  Airway Management Planned: Oral ETT  Additional Equipment:   Intra-op Plan:   Post-operative Plan: Extubation in OR  Informed Consent: I have reviewed the patients History and Physical, chart, labs and discussed the procedure including the risks, benefits and alternatives for the proposed anesthesia with the patient or authorized representative who has indicated his/her understanding and acceptance.     Dental advisory given  Plan Discussed with: CRNA and Anesthesiologist  Anesthesia Plan Comments:         Anesthesia Quick Evaluation  

## 2017-05-15 ENCOUNTER — Encounter (HOSPITAL_COMMUNITY): Payer: Self-pay | Admitting: Orthopedic Surgery

## 2017-05-15 MED ORDER — HYDROMORPHONE HCL 2 MG PO TABS: 2.0000 mg | ORAL_TABLET | ORAL | 0 refills | Status: DC | PRN

## 2017-05-15 NOTE — Progress Notes (Signed)
RN gave pt and husband Discharge instructions pt is comfortable IV has been removed pt said she will call ortho office about her muscle relaxer and 800mg  ibuprofen

## 2017-05-15 NOTE — Discharge Summary (Signed)
PATIENT ID:      [name]  MRN:     [id number] DOB/AGE:    July 25, 1945 / 72 y.o.     DISCHARGE SUMMARY  ADMISSION DATE:    05/14/2017 DISCHARGE DATE:    ADMISSION DIAGNOSIS: Right shoulder osteoarthritis, rotator cuff tear arthropathy Past Medical History:  Diagnosis Date  . Arthritis   . Headache   . Hypertension   . Hypothyroidism   . Pneumonia    hx  . Rotator cuff tear arthropathy, right     DISCHARGE DIAGNOSIS:   Active Problems:   S/p reverse total shoulder arthroplasty   PROCEDURE: Procedure(s): RIGHT REVERSE SHOULDER ARTHROPLASTY on 05/14/2017  CONSULTS:    HISTORY:  See H&P in chart.  HOSPITAL COURSE:  [name] is a 72 y.o. admitted on 05/14/2017 with a diagnosis of Right shoulder osteoarthritis, rotator cuff tear arthropathy.  They were brought to the operating room on 05/14/2017 and underwent Procedure(s): RIGHT REVERSE SHOULDER ARTHROPLASTY.    They were given perioperative antibiotics:  Anti-infectives (From admission, onward)   Start     Dose/Rate Route Frequency Ordered Stop   05/14/17 1106  ceFAZolin (ANCEF) IVPB 2g/100 mL premix     2 g 200 mL/hr over 30 Minutes Intravenous On call to O.R. 05/14/17 1106 05/14/17 1342    .  Patient underwent the above named procedure and tolerated it well. The following day they were hemodynamically stable and pain was controlled on oral analgesics. They were neurovascularly intact to the operative extremity. OT was ordered and worked with patient per protocol. They were medically and orthopaedically stable for discharge on day 1 .    DIAGNOSTIC STUDIES:  RECENT RADIOGRAPHIC STUDIES :  No results found.  RECENT VITAL SIGNS:   Patient Vitals for the past 24 hrs:  BP Temp Temp src Pulse Resp SpO2 Height Weight  05/15/17 0507 (!) 120/59 98.3 F (36.8 C) Oral 73 16 94 % - -  05/14/17 2342 (!) 126/52 98.2 F (36.8 C) Oral 79 16 92 % - -  05/14/17 2000 (!) 142/64 98 F (36.7 C) Oral 73 16 94 % - -  05/14/17  1656 (!) 149/70 (!) 97.5 F (36.4 C) Oral 68 16 97 % - -  05/14/17 1628 139/66 - - 73 13 96 % - -  05/14/17 1623 135/71 - - 73 15 97 % - -  05/14/17 1608 (!) 145/60 - - 73 16 95 % - -  05/14/17 1552 120/75 - - 73 14 96 % - -  05/14/17 1538 (!) 125/55 97.6 F (36.4 C) - 73 18 99 % - -  05/14/17 1255 (!) 131/56 - - 73 18 98 % - -  05/14/17 1250 (!) 135/50 - - 73 (!) 8 100 % - -  05/14/17 1245 140/61 - - 73 10 100 % - -  05/14/17 1240 (!) 143/64 - - 73 13 100 % - -  05/14/17 1235 (!) 139/58 - - 73 16 100 % - -  05/14/17 1230 138/61 - - 73 12 100 % - -  05/14/17 1223 132/83 - - 73 (!) 9 100 % - -  05/14/17 1220 (!) 147/58 - - 73 10 100 % - -  05/14/17 1121 - - - - - - 5\' 4"  (1.626 m) 84.4 kg (186 lb)  05/14/17 1114 (!) 138/53 (!) 97.5 F (36.4 C) Oral 73 18 99 % - -  .  RECENT EKG RESULTS:    Orders placed or performed  during the hospital encounter of 12/20/14  . EKG 12-Lead  . EKG 12-Lead    DISCHARGE INSTRUCTIONS:    DISCHARGE MEDICATIONS:   Allergies as of 05/15/2017      Reactions   Atorvastatin Other (See Comments)   *hairloss, 08 Mar 2013   Oxycodone Itching, Other (See Comments)   Can take with antihistamines       Medication List    STOP taking these medications   ondansetron 4 MG tablet Commonly known as:  ZOFRAN   oxyCODONE-acetaminophen 10-325 MG tablet Commonly known as:  PERCOCET     TAKE these medications   ASHWAGANDHA PO Take 1 capsule by mouth 2 (two) times daily.   aspirin 81 MG tablet Take 81 mg by mouth at bedtime.   CALTRATE 600+D 600-400 MG-UNIT tablet Generic drug:  Calcium Carbonate-Vitamin D Take 2 tablets by mouth daily.   cetirizine 10 MG tablet Commonly known as:  ZYRTEC Take 10 mg by mouth daily.   Co Q 10 100 MG Caps Take 100 mg by mouth every morning.   [employer] LUBRICATING/DRY EYE OP Place 1 drop into both eyes 3 (three) times daily as needed (for dry eyes).   gabapentin 300 MG capsule Commonly known as:  NEURONTIN Take 600  mg by mouth See admin instructions. Take 600 mg by mouth in the morning around 0400 and take 600 mg by mouth at bedtime   guaiFENesin 600 MG 12 hr tablet Commonly known as:  MUCINEX Take 600 mg by mouth 2 (two) times daily.   HYDROmorphone 2 MG tablet Commonly known as:  DILAUDID Take 1 tablet (2 mg total) by mouth every 4 (four) hours as needed for severe pain (pain not controlled by ibuprofen).   Krill Oil 1000 MG Caps Take 1,000 mg by mouth 2 (two) times daily.   levothyroxine 88 MCG tablet Commonly known as:  SYNTHROID, LEVOTHROID Take 88 mcg by mouth daily before breakfast.   liothyronine 5 MCG tablet Commonly known as:  CYTOMEL Take 5 mcg by mouth daily.   lisinopril-hydrochlorothiazide 20-25 MG tablet Commonly known as:  PRINZIDE,ZESTORETIC Take 1 tablet by mouth daily with breakfast.   MILK THISTLE PO Take 1 capsule by mouth 2 (two) times daily.   multivitamin with minerals tablet Take 1 tablet by mouth at bedtime.   polyethylene glycol powder powder Commonly known as:  GLYCOLAX Take 17 g by mouth daily.   PROBIOTIC PO Take 1 capsule by mouth daily.   Red Yeast Rice 600 MG Caps Take 600 mg by mouth every morning.   temazepam 30 MG capsule Commonly known as:  RESTORIL Take 30 mg by mouth at bedtime.   Vitamin D3 5000 units Tabs Take 5,000 Units by mouth at bedtime.       FOLLOW UP VISIT:   Follow-up Information    Justice Britain, MD.   Specialty:  Orthopedic Surgery Why:  call to be seen in 10-14 days Contact information: [address] [place] [address] [phone]           DISCHARGE TO: Home   [name] Ramari Bray for Dr. Justice Britain 05/15/2017, 8:58 AM

## 2017-05-15 NOTE — Op Note (Signed)
[name], [name]                ACCOUNT NO.:  [id number]  MEDICAL RECORD NO.:  [id number]  LOCATION:  [facility]                         FACILITY:  [facility]  PHYSICIAN:  [name], M.D.  DATE OF BIRTH:  1945/09/02  DATE OF PROCEDURE:  05/14/2017 DATE OF DISCHARGE:                              OPERATIVE REPORT   PREOPERATIVE DIAGNOSIS:  End-stage right shoulder rotator cuff tear arthropathy.  POSTOPERATIVE DIAGNOSIS:  End-stage right shoulder rotator cuff tear arthropathy.  PROCEDURES:  Right shoulder reverse arthroplasty utilizing a press-fit size 6 Arthrex stem with a +3 polyethylene insert on a neutral metaphysis, a 36+ 4 on a small baseplate.  SURGEON:  Wendy Clines. [name], M.D.  Wendy DupontOlivia Mackie A. Lyons, P.A.-C.  ANESTHESIA:  General endotracheal as well as interscalene block.  ESTIMATED BLOOD LOSS:  150 mL.  DRAINS:  None  HISTORY:  [name] is a 72 year old female who has had previous rotator cuff tear treated with arthroscopic rotator cuff repair.  Unfortunately, went onto a recurrent tear as well as significant progression of underlying glenohumeral arthritis with large peripheral osteophytes noted on her radiographs.  Clinically, she demonstrates profoundly restricted motion with severe pain and increasing functional limitations.  She is brought to the operating room at this time for planned right shoulder reverse arthroplasty.  Preoperatively, I counseled [name] regarding treatment options and potential risks versus benefits thereof.  Possible surgical complications were reviewed including potential for bleeding, infection, neurovascular injury, persistent pain, loss of motion, anesthetic complication, failure of the implant and possible need for additional surgery.  She understands and accepts and agrees with our planned procedure.  PROCEDURE IN DETAIL:  After undergoing routine preop evaluation, the patient received prophylactic antibiotics and  interscalene block with Exparel.  She was then placed in the holding area by the Anesthesia Department.  Placed supine on the operating table, underwent smooth induction of a general endotracheal anesthesia.  Placed in a beach-chair position and appropriately padded and protected.  The right shoulder girdle region was sterilely prepped and draped in standard fashion. Time-out was called.  In an anterior deltopectoral approach, the right shoulder was made through an 8 cm incision.  Skin flaps were elevated. Dissection carried deeply and the deltopectoral interval was then developed from proximal to distal, the vein taken laterally.  The upper centimeter of the pec major was tenotomized to enhance exposure.  The conjoined tendon mobilized and retracted medially.  Adhesions divided beneath the deltoid as well.  The long head biceps tendon was then tenodesed at the upper border of the pec major tendon insertion and it was tenotomized.  Proximal portion was unroofed and excised.  The subscapularis tendon was then skeletonized off the lesser tuberosity and tagged with a pair of #2 FiberWire grasping sutures.  We then divided the capsular attachments from the anteroinferior and inferior aspects of the humeral neck allowing delivery of the humeral head through the wound.  We then outlined our proposed humeral head resection using extramedullary guide.  This was then completed with an oscillating saw. At this point, a metal cap was then placed over the cut proximal humeral surface and would then expose the glenoid with a  combination of Fukuda, pitchfork, and snake tongue retractors.  A circumferential labral resection was then completed with once again, complete visualization of the glenoid.  The guide pin was placed into the center of the glenoid with an approximately 5 degree inferior tilt.  The glenoid was then reamed to a stable subchondral bony bed and all debris was then removed. We  completed preparation with our central and peripheral reamer obtaining appropriate bed for base plate.  Base plate was then impacted and transfixed with a central lag screw and inferior and superior locking screws respectively.  Excellent fixation was achieved.  We then confirmed that the baseplate was properly seated and reamed the periphery around the baseplate.  We then placed our 36+ 4 glenoid sphere which was impacted and stability was then confirmed.  At this point, we then returned our attention to the proximal humerus where the canal was hand reamed up to size 7 which showed tight canal fit, broached to a size 6 with excellent fit and fixation.  We then performed the metaphyseal preparation maintaining the native retroversion approximately 20 degrees.  We then implanted a trial implant with a +3 poly, and this showed good soft tissue balance and excellent stability. The trial was then removed.  Our final prosthesis was then assembled on the back table and it was then terminally impacted again with excellent fit and fixation.  I then performed again a trial reduction with +3 poly and this showed appropriate soft tissue balance.  Trial was removed.  We impacted our +3 poly into the metaphysis of our humeral stem.  Final reduction was then performed.  We were very pleased with the overall soft tissue balance and good stability and good shoulder motion.  The wound was then copiously irrigated.  Hemostasis was obtained.  The subscapularis was then repaired back to the metaphyseal region and lesser tuberosity using the eyelets on the collar of our stem.  The deltopectoral interval was then reapproximated with a series of figure- of-eight #1 Vicryl sutures, 2-0 Vicryl was used for subcu layer, intracuticular 3-0 Monocryl for the skin, followed by Dermabond and Aquacel dressing.  Right arm was placed in a sling.  The patient was awakened, extubated, and taken tot the recovery room in  stable condition.  [name], P.A.-C. was used as an Environmental consultant throughout this case, essential for help with positioning the patient, positioning the extremity, tissue manipulation, suture management, implantation of prosthesis, wound closure, and intraoperative decision making.     Wendy Clines. Krisandra Bueno, M.D.     KMS/MEDQ  D:  05/14/2017  T:  05/15/2017  Job:  [id number]

## 2017-05-15 NOTE — Evaluation (Signed)
Occupational Therapy Evaluation and Discharge Patient Details Name: [name] MRN: [id number] DOB: 12-17-1945 Today's Date: 05/15/2017    History of Present Illness Pt is a 72 y/o famel s/p right TSA.    Clinical Impression   PTA Pt independent in ADL and mobility. Education provided for compensatory strategies for ADL, as well as exercises per instructions by MD (please see exercise section below). Husband present throughout session. Block still in place throughout session. No questions or concerns for OT from Pt/family. OT to sign off with follow up per MD. Thank you for the opportunity to serve this patient.     Follow Up Recommendations  Follow surgeon's recommendation for DC plan and follow-up therapies    Equipment Recommendations  None recommended by OT    Recommendations for Other Services       Precautions / Restrictions Precautions Precautions: Shoulder Type of Shoulder Precautions: Active Protocol Shoulder Interventions: Shoulder sling/immobilizer;At all times;Off for dressing/bathing/exercises Precaution Booklet Issued: Yes (comment) Precaution Comments: OT shoulder DC handout reviewed in full Required Braces or Orthoses: Sling Restrictions Weight Bearing Restrictions: Yes RUE Weight Bearing: Non weight bearing Other Position/Activity Restrictions: OK to come out of sling in protected/supported environment; no resisted internal rotation      Mobility Bed Mobility Overal bed mobility: Modified Independent                Transfers Overall transfer level: Modified independent                    Balance Overall balance assessment: No apparent balance deficits (not formally assessed)                                         ADL either performed or assessed with clinical judgement   ADL                                         General ADL Comments: see shoulder section below     Vision Patient Visual  Report: No change from baseline Vision Assessment?: No apparent visual deficits     Perception     Praxis      Pertinent Vitals/Pain Pain Assessment: No/denies pain(block still in place)     Hand Dominance Right   Extremity/Trunk Assessment Upper Extremity Assessment Upper Extremity Assessment: RUE deficits/detail RUE Deficits / Details: s/p sx RUE: Unable to fully assess due to immobilization RUE Sensation: decreased light touch(block still in place) RUE Coordination: decreased fine motor;decreased gross motor   Lower Extremity Assessment Lower Extremity Assessment: Overall WFL for tasks assessed       Communication Communication Communication: No difficulties   Cognition Arousal/Alertness: Awake/alert Behavior During Therapy: WFL for tasks assessed/performed Overall Cognitive Status: Within Functional Limits for tasks assessed                                     General Comments  Husband present throughout session    Exercises Exercises: Shoulder Shoulder Exercises Pendulum Exercise: PROM;Right;Seated;Standing(gentle) Shoulder Flexion: PROM;Right;Supine(to 60) Shoulder ABduction: PROM;Right;Seated(to 45) Shoulder External Rotation: PROM;Right;Seated(to 20) Elbow Flexion: PROM;AROM;Seated Wrist Flexion: AROM;Right Wrist Extension: AROM;Right Digit Composite Flexion: AROM;Right Composite Extension: AROM;Right Neck Flexion: AROM Neck Extension: AROM Neck Lateral Flexion -  Right: AROM Neck Lateral Flexion - Left: AROM   Shoulder Instructions Shoulder Instructions Donning/doffing shirt without moving shoulder: Maximal assistance;Caregiver independent with task;Patient able to independently direct caregiver Method for sponge bathing under operated UE: Supervision/safety;Caregiver independent with task Donning/doffing sling/immobilizer: Moderate assistance;Caregiver independent with task;Patient able to independently direct caregiver Correct  positioning of sling/immobilizer: Modified independent Pendulum exercises (written home exercise program): Supervision/safety ROM for elbow, wrist and digits of operated UE: Moderate assistance Sling wearing schedule (on at all times/off for ADL's): Independent Proper positioning of operated UE when showering: Supervision/safety Positioning of UE while sleeping: Moderate assistance;Caregiver independent with task;Patient able to independently direct caregiver    Home Living Family/patient expects to be discharged to:: Private residence Living Arrangements: Spouse/significant other Available Help at Discharge: Family;Available 24 hours/day Type of Home: House Home Access: Stairs to enter [employer] of Steps: 3 Entrance Stairs-Rails: None Home Layout: Two level Alternate Level Stairs-Number of Steps: flight Alternate Level Stairs-Rails: Right Bathroom Shower/Tub: Occupational psychologist: Standard     Home Equipment: Environmental consultant - 2 wheels;Crutches;Bedside commode;Shower seat - built in;Hand held shower head          Prior Functioning/Environment Level of Independence: Independent        Comments: loves to travel, drives etc        OT Problem List: Impaired UE functional use;Pain;Decreased knowledge of precautions;Decreased range of motion;Decreased strength      OT Treatment/Interventions:      OT Goals(Current goals can be found in the care plan section) Acute Rehab OT Goals Patient Stated Goal: to get home OT Goal Formulation: With patient/family Time For Goal Achievement: 05/28/17 Potential to Achieve Goals: Good  OT Frequency:     Barriers to D/C:            Co-evaluation              AM-PAC PT "6 Clicks" Daily Activity     Outcome Measure Help from another person eating meals?: A Little Help from another person taking care of personal grooming?: A Little Help from another person toileting, which includes using toliet, bedpan, or  urinal?: A Little Help from another person bathing (including washing, rinsing, drying)?: A Little Help from another person to put on and taking off regular upper body clothing?: A Lot Help from another person to put on and taking off regular lower body clothing?: A Lot 6 Click Score: 16   End of Session Nurse Communication: Mobility status;Other (comment)(status of block)  Activity Tolerance: Patient tolerated treatment well Patient left: in bed;with family/visitor present;with call bell/phone within reach(EOB finishing getting dressed)  OT Visit Diagnosis: Pain Pain - Right/Left: Right Pain - part of body: Shoulder                Time: 0920-1005 OT Time Calculation (min): 45 min Charges:  OT General Charges $OT Visit: 1 Visit OT Evaluation $OT Eval Moderate Complexity: 1 Mod OT Treatments $Self Care/Home Management : 8-22 mins $Therapeutic Exercise: 8-22 mins G-Codes:     Hulda Humphrey OTR/L [place] 05/15/2017, 11:48 AM

## 2017-07-24 ENCOUNTER — Encounter (HOSPITAL_COMMUNITY): Payer: Self-pay

## 2017-07-24 ENCOUNTER — Encounter (HOSPITAL_COMMUNITY)
Admission: RE | Admit: 2017-07-24 | Discharge: 2017-07-24 | Disposition: A | Discharge status: Home / Self Care | Payer: Medicare Other | Source: Ambulatory Visit | Attending: Orthopedic Surgery | Admitting: Orthopedic Surgery

## 2017-07-24 DIAGNOSIS — Z01812 Encounter for preprocedural laboratory examination: Secondary | ICD-10-CM | POA: Insufficient documentation

## 2017-07-24 DIAGNOSIS — M19012 Primary osteoarthritis, left shoulder: Secondary | ICD-10-CM | POA: Insufficient documentation

## 2017-07-24 LAB — CBC
HCT: 43.8 % (ref 36.0–46.0)
Hemoglobin: 14.8 g/dL (ref 12.0–15.0)
MCH: 29.7 pg (ref 26.0–34.0)
MCHC: 33.8 g/dL (ref 30.0–36.0)
MCV: 87.8 fL (ref 78.0–100.0)
Platelets: 233 10*3/uL (ref 150–400)
RBC: 4.99 MIL/uL (ref 3.87–5.11)
RDW: 12.4 % (ref 11.5–15.5)
WBC: 6.3 10*3/uL (ref 4.0–10.5)

## 2017-07-24 LAB — BASIC METABOLIC PANEL
Anion gap: 12 (ref 5–15)
BUN: 21 mg/dL — ABNORMAL HIGH (ref 6–20)
CALCIUM: 9.7 mg/dL (ref 8.9–10.3)
CO2: 25 mmol/L (ref 22–32)
Chloride: 101 mmol/L (ref 101–111)
Creatinine, Ser: 0.88 mg/dL (ref 0.44–1.00)
GFR calc non Af Amer: >60 mL/min (ref >60)
Glucose, Bld: 88 mg/dL (ref 65–99)
Potassium: 4.7 mmol/L (ref 3.5–5.1)
SODIUM: 138 mmol/L (ref 135–145)

## 2017-07-24 LAB — SURGICAL PCR SCREEN
MRSA, PCR: NEGATIVE
STAPHYLOCOCCUS AUREUS: NEGATIVE

## 2017-07-24 NOTE — Progress Notes (Signed)
Anesthesia Chart Review:  Case:  [id number] Date/Time:  07/30/17 1245   Procedure:  REVERSE SHOULDER ARTHROPLASTY (Left )   Anesthesia type:  General   Pre-op diagnosis:  Left Shoulder osteoarthritis   Location:  [facility] OR ROOM 06 / [facility]   Surgeon:  Justice Britain, MD      DISCUSSION:  Patient is a 72 year old female scheduled for the above procedure. History includes never smoker, HTN, hypothyroidism. She is s/p right reverse shoulder arthroplasty on 05/14/17.   If no acute changes then I anticipate that she can proceed as planned.   VS: BP (!) 161/73   Pulse 78   Temp 36.7 C   Resp 20   Ht 5' 3.5" (1.613 m)   Wt 190 lb 8 oz (86.4 kg)   SpO2 99%   BMI 33.22 kg/m   PROVIDERS: Kristopher Glee., MD is PCP (see [place]).   LABS: Labs reviewed: Acceptable for surgery. (all labs ordered are listed, but only abnormal results are displayed)  Labs Reviewed  BASIC METABOLIC PANEL - Abnormal; Notable for the following components:      Result Value   BUN 21 (*)    All other components within normal limits  SURGICAL PCR SCREEN  CBC    EKG: 04/13/17 ([place]; done during  [facility] visit for acute costochondritis; copy scanned under Media tab, Correspondence, 05/14/17):  Sinus rhythm Normal ECG When compared with ECG of 07-Jun-2012 22:53, Incomplete right bundle branch block is no longer present Confirmed by fellow Amjad, Aysha ([id number]) on 04/13/2017 2:35:35 PM Confirmed by HAISTY, DR. W. K. ([id number]) on 04/13/2017 4:32:32 PM    CV: N/A  Past Medical History:  Diagnosis Date  . Arthritis   . Headache   . Hypertension   . Hypothyroidism   . Pneumonia    hx  . Rotator cuff tear arthropathy, right     Past Surgical History:  Procedure Laterality Date  . APPENDECTOMY    . BLADDER SUSPENSION  2011  . COLONOSCOPY    . JOINT REPLACEMENT    . 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 REVERSE SHOULDER ARTHROPLASTY  Right 05/14/2017   Procedure: RIGHT REVERSE SHOULDER ARTHROPLASTY;  Surgeon: Justice Britain, MD;  Location: [facility];  Service: Orthopedics;  Laterality: Right;  . right knee replacment  08  . SACROILIAC JOINT FUSION Right 12/27/2014   Procedure: RIGHT SACROILIAC JOINT FUSION;  Surgeon: Melina Schools, MD;  Location: [facility];  Service: Orthopedics;  Laterality: Right;  right sacral fusion  . TONSILLECTOMY    . TOTAL HIP ARTHROPLASTY  09/08/2011   Procedure: TOTAL HIP ARTHROPLASTY;  Surgeon: Gearlean Alf, MD;  Location: [facility];  Service: Orthopedics;  Laterality: Right;    MEDICATIONS: . ASHWAGANDHA PO  . aspirin 81 MG tablet  . Calcium Carbonate-Vitamin D (CALTRATE 600+D) 600-400 MG-UNIT tablet  . Carboxymethylcellul-Glycerin ([employer]S LUBRICATING/DRY EYE OP)  . cetirizine (ZYRTEC) 10 MG tablet  . Cholecalciferol (VITAMIN D3) 5000 UNITS TABS  . Coenzyme Q10 (CO Q 10) 100 MG CAPS  . gabapentin (NEURONTIN) 300 MG capsule  . guaiFENesin (MUCINEX) 600 MG 12 hr tablet  . HYDROmorphone (DILAUDID) 2 MG tablet  . Krill Oil 1000 MG CAPS  . levothyroxine (SYNTHROID, LEVOTHROID) 88 MCG tablet  . liothyronine (CYTOMEL) 5 MCG tablet  . lisinopril-hydrochlorothiazide (PRINZIDE,ZESTORETIC) 20-25 MG tablet  . MILK THISTLE PO  . Multiple Vitamins-Minerals (MULTIVITAMIN WITH  MINERALS) tablet  . polyethylene glycol powder (GLYCOLAX) powder  . Probiotic Product (PROBIOTIC PO)  . rosuvastatin (CRESTOR) 10 MG tablet  . temazepam (RESTORIL) 30 MG capsule   No current facility-administered medications for this encounter.   She is to hold ASA, NSAIDS, vitamins and herbal supplements 5-7 days prior to surgery.  [facility] Short Stay Center/Anesthesiology Phone [phone] 07/24/2017 4:32 PM

## 2017-07-24 NOTE — Pre-Procedure Instructions (Signed)
    [name]  07/24/2017      [facility], [place] - [place] AT [address] [place] [address] Phone: [phone] Fax: [phone]    Your procedure is scheduled on 07/30/17.  Report to [facility] Admitting at 11 A.M.  Call this number if you have problems the morning of surgery:  [phone]   Remember:  No food after midnight.  Y   Take these medicines the morning of surgery with A SIP OF WATER --zyrtec,neurontin,synthroid,liothyroxine    Do not wear jewelry, make-up or nail polish.  Do not wear lotions, powders, or perfumes, or deodorant.  Do not shave 48 hours prior to surgery.  Men may shave face and neck.  Do not bring valuables to the hospital.  [facility] is not responsible for any belongings or valuables.  Contacts, dentures or bridgework may not be worn into surgery.  Leave your suitcase in the car.  After surgery it may be brought to your room.  For patients admitted to the hospital, discharge time will be determined by your treatment team.  Patients discharged the day of surgery will not be allowed to drive home.   Name and phone number of your driver:  Special instructions:  Do not take any aspirin,anti-inflammatories,vitamins,or herbal supplements 5-7 days prior to surgery.  Please read over the following fact sheets that you were given. MRSA Information

## 2017-07-29 MED ORDER — CEFAZOLIN SODIUM-DEXTROSE 2-4 GM/100ML-% IV SOLN
2.0000 g | INTRAVENOUS | Status: AC
  Administered 2017-07-30: 2 g via INTRAVENOUS
  Filled 2017-07-29: qty 100

## 2017-07-29 MED ORDER — SODIUM CHLORIDE 0.9 % IV SOLN
1000.0000 mg | INTRAVENOUS | Status: AC
  Administered 2017-07-30: 1000 mg via INTRAVENOUS
  Filled 2017-07-29: qty 1100

## 2017-07-29 MED ORDER — BUPIVACAINE LIPOSOME 1.3 % IJ SUSP
20.0000 mL | INTRAMUSCULAR | Status: DC
  Filled 2017-07-29: qty 20

## 2017-07-30 ENCOUNTER — Encounter (HOSPITAL_COMMUNITY): Payer: Self-pay | Admitting: Urology

## 2017-07-30 ENCOUNTER — Other Ambulatory Visit: Payer: Self-pay

## 2017-07-30 ENCOUNTER — Inpatient Hospital Stay (HOSPITAL_COMMUNITY)
Admission: RE | Admit: 2017-07-30 | Discharge: 2017-07-31 | DRG: 483 | Disposition: A | Discharge status: Home / Self Care | Payer: Medicare Other | Source: Ambulatory Visit | Attending: Orthopedic Surgery | Admitting: Orthopedic Surgery

## 2017-07-30 ENCOUNTER — Inpatient Hospital Stay (HOSPITAL_COMMUNITY): Payer: Medicare Other | Admitting: Emergency Medicine

## 2017-07-30 ENCOUNTER — Encounter (HOSPITAL_COMMUNITY)
Admission: RE | Disposition: A | Discharge status: Home / Self Care | Payer: Self-pay | Source: Ambulatory Visit | Attending: Orthopedic Surgery

## 2017-07-30 ENCOUNTER — Inpatient Hospital Stay (HOSPITAL_COMMUNITY): Payer: Medicare Other | Admitting: Certified Registered Nurse Anesthetist

## 2017-07-30 DIAGNOSIS — I10 Essential (primary) hypertension: Secondary | ICD-10-CM | POA: Diagnosis present

## 2017-07-30 DIAGNOSIS — M75102 Unspecified rotator cuff tear or rupture of left shoulder, not specified as traumatic: Secondary | ICD-10-CM | POA: Diagnosis present

## 2017-07-30 DIAGNOSIS — Z96643 Presence of artificial hip joint, bilateral: Secondary | ICD-10-CM | POA: Diagnosis present

## 2017-07-30 DIAGNOSIS — Z79890 Hormone replacement therapy (postmenopausal): Secondary | ICD-10-CM

## 2017-07-30 DIAGNOSIS — Z96611 Presence of right artificial shoulder joint: Secondary | ICD-10-CM | POA: Diagnosis present

## 2017-07-30 DIAGNOSIS — Z96612 Presence of left artificial shoulder joint: Secondary | ICD-10-CM

## 2017-07-30 DIAGNOSIS — M19012 Primary osteoarthritis, left shoulder: Principal | ICD-10-CM | POA: Diagnosis present

## 2017-07-30 DIAGNOSIS — E039 Hypothyroidism, unspecified: Secondary | ICD-10-CM | POA: Diagnosis present

## 2017-07-30 DIAGNOSIS — Z7982 Long term (current) use of aspirin: Secondary | ICD-10-CM | POA: Diagnosis not present

## 2017-07-30 DIAGNOSIS — Z96619 Presence of unspecified artificial shoulder joint: Secondary | ICD-10-CM

## 2017-07-30 HISTORY — PX: REVERSE SHOULDER ARTHROPLASTY: SHX5054

## 2017-07-30 SURGERY — ARTHROPLASTY, SHOULDER, TOTAL, REVERSE
Anesthesia: Regional | Site: Shoulder | Laterality: Left

## 2017-07-30 MED ORDER — HYDROCODONE-ACETAMINOPHEN 7.5-325 MG PO TABS
1.0000 | ORAL_TABLET | Freq: Once | ORAL | Status: DC | PRN
Start: 2017-07-30 — End: 2017-07-30

## 2017-07-30 MED ORDER — KETOROLAC TROMETHAMINE 15 MG/ML IJ SOLN
15.0000 mg | Freq: Four times a day (QID) | INTRAMUSCULAR | Status: DC
  Administered 2017-07-30 – 2017-07-31 (×3): 15 mg via INTRAVENOUS
  Filled 2017-07-30 (×3): qty 1

## 2017-07-30 MED ORDER — LIOTHYRONINE SODIUM 5 MCG PO TABS
5.0000 ug | ORAL_TABLET | Freq: Every day | ORAL | Status: DC
  Filled 2017-07-30: qty 1

## 2017-07-30 MED ORDER — METHOCARBAMOL 1000 MG/10ML IJ SOLN
500.0000 mg | Freq: Four times a day (QID) | INTRAVENOUS | Status: DC | PRN
  Filled 2017-07-30: qty 5

## 2017-07-30 MED ORDER — DEXAMETHASONE SODIUM PHOSPHATE 10 MG/ML IJ SOLN
INTRAMUSCULAR | Status: AC
  Filled 2017-07-30: qty 1

## 2017-07-30 MED ORDER — LEVOTHYROXINE SODIUM 88 MCG PO TABS: 88.0000 ug | ORAL_TABLET | Freq: Every day | ORAL | Status: DC

## 2017-07-30 MED ORDER — SUGAMMADEX SODIUM 200 MG/2ML IV SOLN
INTRAVENOUS | Status: DC | PRN
  Administered 2017-07-30: 200 mg via INTRAVENOUS

## 2017-07-30 MED ORDER — LACTATED RINGERS IV SOLN
INTRAVENOUS | Status: DC
  Administered 2017-07-30: 17:00:00 via INTRAVENOUS

## 2017-07-30 MED ORDER — PHENOL 1.4 % MT LIQD: 1.0000 | OROMUCOSAL | Status: DC | PRN

## 2017-07-30 MED ORDER — METOCLOPRAMIDE HCL 5 MG/ML IJ SOLN: 5.0000 mg | Freq: Three times a day (TID) | INTRAMUSCULAR | Status: DC | PRN

## 2017-07-30 MED ORDER — 0.9 % SODIUM CHLORIDE (POUR BTL) OPTIME
TOPICAL | Status: DC | PRN
  Administered 2017-07-30: 1000 mL

## 2017-07-30 MED ORDER — MIDAZOLAM HCL 2 MG/2ML IJ SOLN
INTRAMUSCULAR | Status: AC
  Filled 2017-07-30: qty 2

## 2017-07-30 MED ORDER — TRAMADOL HCL 50 MG PO TABS: 50.0000 mg | ORAL_TABLET | Freq: Four times a day (QID) | ORAL | Status: DC | PRN

## 2017-07-30 MED ORDER — ROCURONIUM BROMIDE 100 MG/10ML IV SOLN
INTRAVENOUS | Status: DC | PRN
  Administered 2017-07-30: 50 mg via INTRAVENOUS

## 2017-07-30 MED ORDER — MAGNESIUM CITRATE PO SOLN: 1.0000 | Freq: Once | ORAL | Status: DC | PRN

## 2017-07-30 MED ORDER — DIPHENHYDRAMINE HCL 12.5 MG/5ML PO ELIX: 12.5000 mg | ORAL_SOLUTION | ORAL | Status: DC | PRN

## 2017-07-30 MED ORDER — FENTANYL CITRATE (PF) 250 MCG/5ML IJ SOLN
INTRAMUSCULAR | Status: AC
  Filled 2017-07-30: qty 5

## 2017-07-30 MED ORDER — GABAPENTIN 300 MG PO CAPS
300.0000 mg | ORAL_CAPSULE | Freq: Every day | ORAL | Status: DC
  Administered 2017-07-30: 300 mg via ORAL
  Filled 2017-07-30: qty 1

## 2017-07-30 MED ORDER — HYDROMORPHONE HCL 2 MG PO TABS: 2.0000 mg | ORAL_TABLET | ORAL | Status: DC | PRN

## 2017-07-30 MED ORDER — MIDAZOLAM HCL 2 MG/2ML IJ SOLN
INTRAMUSCULAR | Status: AC
  Administered 2017-07-30: 1 mg via INTRAVENOUS
  Filled 2017-07-30: qty 2

## 2017-07-30 MED ORDER — LISINOPRIL-HYDROCHLOROTHIAZIDE 20-25 MG PO TABS: 1.0000 | ORAL_TABLET | Freq: Every day | ORAL | Status: DC

## 2017-07-30 MED ORDER — BISACODYL 5 MG PO TBEC: 5.0000 mg | DELAYED_RELEASE_TABLET | Freq: Every day | ORAL | Status: DC | PRN

## 2017-07-30 MED ORDER — DOCUSATE SODIUM 100 MG PO CAPS
100.0000 mg | ORAL_CAPSULE | Freq: Two times a day (BID) | ORAL | Status: DC
  Administered 2017-07-30: 100 mg via ORAL
  Filled 2017-07-30: qty 1

## 2017-07-30 MED ORDER — PROPOFOL 10 MG/ML IV BOLUS
INTRAVENOUS | Status: DC | PRN
  Administered 2017-07-30: 120 mg via INTRAVENOUS
  Administered 2017-07-30 (×2): 20 mg via INTRAVENOUS

## 2017-07-30 MED ORDER — LACTATED RINGERS IV SOLN
INTRAVENOUS | Status: DC | PRN
  Administered 2017-07-30: 13:00:00 via INTRAVENOUS

## 2017-07-30 MED ORDER — CHLORHEXIDINE GLUCONATE 4 % EX LIQD: 60.0000 mL | Freq: Once | CUTANEOUS | Status: DC

## 2017-07-30 MED ORDER — ALUM & MAG HYDROXIDE-SIMETH 200-200-20 MG/5ML PO SUSP: 30.0000 mL | ORAL | Status: DC | PRN

## 2017-07-30 MED ORDER — ONDANSETRON HCL 4 MG/2ML IJ SOLN
INTRAMUSCULAR | Status: AC
  Filled 2017-07-30: qty 2

## 2017-07-30 MED ORDER — ONDANSETRON HCL 4 MG/2ML IJ SOLN
INTRAMUSCULAR | Status: DC | PRN
  Administered 2017-07-30: 4 mg via INTRAVENOUS

## 2017-07-30 MED ORDER — BUPIVACAINE HCL (PF) 0.5 % IJ SOLN
INTRAMUSCULAR | Status: DC | PRN
  Administered 2017-07-30: 15 mL via PERINEURAL

## 2017-07-30 MED ORDER — POLYETHYLENE GLYCOL 3350 17 G PO PACK: 17.0000 g | PACK | Freq: Every day | ORAL | Status: DC | PRN

## 2017-07-30 MED ORDER — ASPIRIN EC 81 MG PO TBEC
81.0000 mg | DELAYED_RELEASE_TABLET | Freq: Every day | ORAL | Status: DC
  Administered 2017-07-30: 81 mg via ORAL
  Filled 2017-07-30: qty 1

## 2017-07-30 MED ORDER — PHENYLEPHRINE HCL 10 MG/ML IJ SOLN
INTRAVENOUS | Status: DC | PRN
  Administered 2017-07-30: 50 ug/min via INTRAVENOUS

## 2017-07-30 MED ORDER — HYDROCHLOROTHIAZIDE 25 MG PO TABS
25.0000 mg | ORAL_TABLET | Freq: Every day | ORAL | Status: DC
  Administered 2017-07-30: 25 mg via ORAL
  Filled 2017-07-30: qty 1

## 2017-07-30 MED ORDER — METHOCARBAMOL 500 MG PO TABS: 500.0000 mg | ORAL_TABLET | Freq: Four times a day (QID) | ORAL | Status: DC | PRN

## 2017-07-30 MED ORDER — MENTHOL 3 MG MT LOZG: 1.0000 | LOZENGE | OROMUCOSAL | Status: DC | PRN

## 2017-07-30 MED ORDER — DEXAMETHASONE SODIUM PHOSPHATE 10 MG/ML IJ SOLN
INTRAMUSCULAR | Status: DC | PRN
  Administered 2017-07-30: 10 mg via INTRAVENOUS

## 2017-07-30 MED ORDER — TEMAZEPAM 15 MG PO CAPS
30.0000 mg | ORAL_CAPSULE | Freq: Every day | ORAL | Status: DC
  Administered 2017-07-30: 30 mg via ORAL
  Filled 2017-07-30: qty 2

## 2017-07-30 MED ORDER — ONDANSETRON HCL 4 MG PO TABS: 4.0000 mg | ORAL_TABLET | Freq: Four times a day (QID) | ORAL | Status: DC | PRN

## 2017-07-30 MED ORDER — ONDANSETRON HCL 4 MG/2ML IJ SOLN: 4.0000 mg | Freq: Four times a day (QID) | INTRAMUSCULAR | Status: DC | PRN

## 2017-07-30 MED ORDER — FENTANYL CITRATE (PF) 100 MCG/2ML IJ SOLN
50.0000 ug | Freq: Once | INTRAMUSCULAR | Status: AC
  Administered 2017-07-30: 50 ug via INTRAVENOUS

## 2017-07-30 MED ORDER — FENTANYL CITRATE (PF) 100 MCG/2ML IJ SOLN: 25.0000 ug | INTRAMUSCULAR | Status: DC | PRN

## 2017-07-30 MED ORDER — ACETAMINOPHEN 325 MG PO TABS: 325.0000 mg | ORAL_TABLET | Freq: Four times a day (QID) | ORAL | Status: DC | PRN

## 2017-07-30 MED ORDER — FENTANYL CITRATE (PF) 100 MCG/2ML IJ SOLN
INTRAMUSCULAR | Status: AC
  Administered 2017-07-30: 50 ug via INTRAVENOUS
  Filled 2017-07-30: qty 2

## 2017-07-30 MED ORDER — BUPIVACAINE LIPOSOME 1.3 % IJ SUSP
INTRAMUSCULAR | Status: DC | PRN
  Administered 2017-07-30: 10 mL via PERINEURAL

## 2017-07-30 MED ORDER — MIDAZOLAM HCL 2 MG/2ML IJ SOLN
1.0000 mg | Freq: Once | INTRAMUSCULAR | Status: AC
  Administered 2017-07-30: 1 mg via INTRAVENOUS

## 2017-07-30 MED ORDER — LISINOPRIL 20 MG PO TABS
20.0000 mg | ORAL_TABLET | Freq: Every day | ORAL | Status: DC
  Administered 2017-07-30: 20 mg via ORAL
  Filled 2017-07-30: qty 1

## 2017-07-30 MED ORDER — HYDROCODONE-ACETAMINOPHEN 7.5-325 MG PO TABS: 1.0000 | ORAL_TABLET | Freq: Once | ORAL | Status: DC | PRN

## 2017-07-30 MED ORDER — METOCLOPRAMIDE HCL 5 MG PO TABS: 5.0000 mg | ORAL_TABLET | Freq: Three times a day (TID) | ORAL | Status: DC | PRN

## 2017-07-30 MED ORDER — HYDROMORPHONE HCL 2 MG/ML IJ SOLN: 0.5000 mg | INTRAMUSCULAR | Status: DC | PRN

## 2017-07-30 SURGICAL SUPPLY — 60 items
ADH SKN CLS APL DERMABOND .7 (GAUZE/BANDAGES/DRESSINGS) ×1
ADH SKN CLS LQ APL DERMABOND (GAUZE/BANDAGES/DRESSINGS) ×1
AID PSTN UNV HD RSTRNT DISP (MISCELLANEOUS) ×1
BASEPLATE GLENOID SHLDR SM (Shoulder) ×2 IMPLANT
BLADE SAW SGTL 83.5X18.5 (BLADE) ×3 IMPLANT
BSPLAT GLND SM PRFT SHLDR CA (Shoulder) ×1 IMPLANT
COVER SURGICAL LIGHT HANDLE (MISCELLANEOUS) ×3 IMPLANT
CUP SUT UNIV REVERS 36 NEUTRAL (Cup) ×2 IMPLANT
DERMABOND ADHESIVE PROPEN (GAUZE/BANDAGES/DRESSINGS) ×2
DERMABOND ADVANCED (GAUZE/BANDAGES/DRESSINGS) ×2
DERMABOND ADVANCED .7 DNX12 (GAUZE/BANDAGES/DRESSINGS) ×1 IMPLANT
DERMABOND ADVANCED .7 DNX6 (GAUZE/BANDAGES/DRESSINGS) IMPLANT
DRAPE ORTHO SPLIT 77X108 STRL (DRAPES) ×6
DRAPE SURG 17X11 SM STRL (DRAPES) ×3 IMPLANT
DRAPE SURG ORHT 6 SPLT 77X108 (DRAPES) ×2 IMPLANT
DRAPE U-SHAPE 47X51 STRL (DRAPES) ×3 IMPLANT
DRSG AQUACEL AG ADV 3.5X10 (GAUZE/BANDAGES/DRESSINGS) ×3 IMPLANT
DURAPREP 26ML APPLICATOR (WOUND CARE) ×3 IMPLANT
ELECT BLADE 4.0 EZ CLEAN MEGAD (MISCELLANEOUS) ×3
ELECT CAUTERY BLADE 6.4 (BLADE) ×3 IMPLANT
ELECT REM PT RETURN 9FT ADLT (ELECTROSURGICAL) ×3
ELECTRODE BLDE 4.0 EZ CLN MEGD (MISCELLANEOUS) ×1 IMPLANT
ELECTRODE REM PT RTRN 9FT ADLT (ELECTROSURGICAL) ×1 IMPLANT
FACESHIELD WRAPAROUND (MASK) ×9 IMPLANT
FACESHIELD WRAPAROUND OR TEAM (MASK) ×3 IMPLANT
GLENOSPHERE LATERAL 36MM+4 (Shoulder) ×2 IMPLANT
GLOVE BIO SURGEON STRL SZ7.5 (GLOVE) ×3 IMPLANT
GLOVE BIO SURGEON STRL SZ8 (GLOVE) ×3 IMPLANT
GLOVE EUDERMIC 7 POWDERFREE (GLOVE) ×3 IMPLANT
GLOVE SS BIOGEL STRL SZ 7.5 (GLOVE) ×1 IMPLANT
GLOVE SUPERSENSE BIOGEL SZ 7.5 (GLOVE) ×2
GOWN STRL REUS W/ TWL LRG LVL3 (GOWN DISPOSABLE) ×1 IMPLANT
GOWN STRL REUS W/ TWL XL LVL3 (GOWN DISPOSABLE) ×2 IMPLANT
GOWN STRL REUS W/TWL LRG LVL3 (GOWN DISPOSABLE) ×3
GOWN STRL REUS W/TWL XL LVL3 (GOWN DISPOSABLE) ×6
KIT BASIN OR (CUSTOM PROCEDURE TRAY) ×3 IMPLANT
KIT TURNOVER KIT B (KITS) ×3 IMPLANT
LINER HUMERAL 36 +3MM SM (Shoulder) ×2 IMPLANT
MANIFOLD NEPTUNE II (INSTRUMENTS) ×3 IMPLANT
NDL TAPERED W/ NITINOL LOOP (MISCELLANEOUS) ×1 IMPLANT
NEEDLE TAPERED W/ NITINOL LOOP (MISCELLANEOUS) ×3 IMPLANT
NS IRRIG 1000ML POUR BTL (IV SOLUTION) ×3 IMPLANT
PACK SHOULDER (CUSTOM PROCEDURE TRAY) ×3 IMPLANT
PAD ARMBOARD 7.5X6 YLW CONV (MISCELLANEOUS) ×6 IMPLANT
RESTRAINT HEAD UNIVERSAL NS (MISCELLANEOUS) ×3 IMPLANT
SCREW CENTRAL NONLOCK 6.5X20MM (Shoulder) ×2 IMPLANT
SCREW LOCK PERIPHERAL 36MM (Screw) ×4 IMPLANT
SLING ARM FOAM STRAP LRG (SOFTGOODS) ×2 IMPLANT
SPONGE LAP 18X18 X RAY DECT (DISPOSABLE) ×3 IMPLANT
SPONGE LAP 4X18 RFD (DISPOSABLE) ×3 IMPLANT
STEM HUMERAL UNI REVERS SZ6 (Stem) ×2 IMPLANT
SUCTION FRAZIER TIP 8 FR DISP (SUCTIONS) ×2
SUCTION TUBE FRAZIER 8FR DISP (SUCTIONS) IMPLANT
SUT MNCRL AB 3-0 PS2 18 (SUTURE) ×3 IMPLANT
SUT MON AB 2-0 CT1 27 (SUTURE) ×3 IMPLANT
SUT VIC AB 1 CT1 27 (SUTURE) ×3
SUT VIC AB 1 CT1 27XBRD ANBCTR (SUTURE) ×1 IMPLANT
SUTURE TAPE 1.3 40 TPR END (SUTURE) ×1 IMPLANT
SUTURETAPE 1.3 40 TPR END (SUTURE) ×3
TOWEL OR 17X26 10 PK STRL BLUE (TOWEL DISPOSABLE) ×3 IMPLANT

## 2017-07-30 NOTE — Anesthesia Preprocedure Evaluation (Signed)
Anesthesia Evaluation  Patient identified by MRN, date of birth, ID band Patient awake    Reviewed: Allergy & Precautions, NPO status , Patient's Chart, lab work & pertinent test results  History of Anesthesia Complications Negative for: history of anesthetic complications  Airway Mallampati: III  TM Distance: >3 FB Neck ROM: Full    Dental  (+) Teeth Intact   Pulmonary neg pulmonary ROS,    breath sounds clear to auscultation       Cardiovascular hypertension, Pt. on medications (-) angina(-) Past MI and (-) CHF  Rhythm:Regular     Neuro/Psych  Headaches, negative psych ROS   GI/Hepatic negative GI ROS, Neg liver ROS,   Endo/Other  Hypothyroidism   Renal/GU negative Renal ROS     Musculoskeletal  (+) Arthritis ,   Abdominal   Peds  Hematology   Anesthesia Other Findings   Reproductive/Obstetrics                             Anesthesia Physical Anesthesia Plan  ASA: II  Anesthesia Plan: General and Regional   Post-op Pain Management:  Regional for Post-op pain   Induction:   PONV Risk Score and Plan: 3 and Ondansetron and Dexamethasone  Airway Management Planned: Oral ETT  Additional Equipment: None  Intra-op Plan:   Post-operative Plan: Extubation in OR  Informed Consent: I have reviewed the patients History and Physical, chart, labs and discussed the procedure including the risks, benefits and alternatives for the proposed anesthesia with the patient or authorized representative who has indicated his/her understanding and acceptance.   Dental advisory given  Plan Discussed with: CRNA and Surgeon  Anesthesia Plan Comments:         Anesthesia Quick Evaluation

## 2017-07-30 NOTE — Anesthesia Postprocedure Evaluation (Signed)
Anesthesia Post Note  Patient: [name]  Procedure(s) Performed: REVERSE SHOULDER ARTHROPLASTY (Left Shoulder)     Patient location during evaluation: PACU Anesthesia Type: Regional Level of consciousness: awake and alert Pain management: pain level controlled Vital Signs Assessment: post-procedure vital signs reviewed and stable Respiratory status: spontaneous breathing, nonlabored ventilation, respiratory function stable and patient connected to nasal cannula oxygen Cardiovascular status: blood pressure returned to baseline and stable Postop Assessment: no apparent nausea or vomiting Anesthesia complication: Mild hoarsness noted prior to induction s/p ISB, Hoarsness still present but improving in PACU.    Last Vitals:  Vitals:   07/30/17 1641 07/30/17 1643  BP:  (!) 156/80  Pulse: 84 85  Resp: 16 16  Temp:  36.6 C  SpO2: 95% 96%    Last Pain:  Vitals:   07/30/17 1643  TempSrc: Oral  PainSc:                  [name]

## 2017-07-30 NOTE — Anesthesia Procedure Notes (Signed)
Procedure Name: Intubation Date/Time: 07/30/2017 1:18 PM Performed by: Shirlyn Goltz, CRNA Pre-anesthesia Checklist: Patient identified, Emergency Drugs available, Suction available, Patient being monitored and Timeout performed Patient Re-evaluated:Patient Re-evaluated prior to induction Oxygen Delivery Method: Circle system utilized Preoxygenation: Pre-oxygenation with 100% oxygen Induction Type: IV induction Ventilation: Mask ventilation without difficulty Laryngoscope Size: Mac and 3 Grade View: Grade III Tube type: Oral Tube size: 7.0 mm Number of attempts: 1 Airway Equipment and Method: Stylet Placement Confirmation: ETT inserted through vocal cords under direct vision,  positive ETCO2,  CO2 detector and breath sounds checked- equal and bilateral Secured at: 22 cm Tube secured with: Tape Dental Injury: Teeth and Oropharynx as per pre-operative assessment  Difficulty Due To: Difficulty was anticipated and Difficult Airway- due to anterior larynx Comments: Placed by Katharina Caper under supervision of MD and CRNA

## 2017-07-30 NOTE — Transfer of Care (Signed)
Immediate Anesthesia Transfer of Care Note  Patient: [name]  Procedure(s) Performed: REVERSE SHOULDER ARTHROPLASTY (Left Shoulder)  Patient Location: PACU  Anesthesia Type:General and Regional  Level of Consciousness: awake, alert , oriented and patient cooperative  Airway & Oxygen Therapy: Patient Spontanous Breathing and Patient connected to face mask oxygen  Post-op Assessment: Report given to RN and Post -op Vital signs reviewed and stable  Post vital signs: Reviewed and stable  Last Vitals:  Vitals Value Taken Time  BP 172/82 07/30/2017  3:20 PM  Temp    Pulse 88 07/30/2017  3:25 PM  Resp 16 07/30/2017  3:25 PM  SpO2 95 % 07/30/2017  3:25 PM  Vitals shown include unvalidated device data.  Last Pain:  Vitals:   07/30/17 1520  TempSrc:   PainSc: (P) Asleep         Complications: No apparent anesthesia complications

## 2017-07-30 NOTE — Op Note (Signed)
07/30/2017  2:59 PM  PATIENT:   [name]  72 y.o. 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  ASSISTANTS: Shuford pac   ANESTHESIA:   GET + ISB  EBL: 150  SPECIMEN:  none  Drains: none   PATIENT DISPOSITION:  PACU - hemodynamically stable.    PLAN OF CARE: Admit for overnight observation    Brief history:  Ms. Ruzich has been followed for bilateral shoulder rotator cuff tear arthropathies.  She is 3 months status post right shoulder reverse arthroplasty and is done very well clinically.  She is brought to the operating this time for planned left reverse shoulder arthroplasty  Preoperative counseled Ms. Radice regarding treatment options as well as the potential risks versus benefits thereof.  Possible surgical complications were reviewed including the potential for bleeding, infection, neurovascular injury, persistent pain, loss of motion, anesthetic complication, failure of the implant, and possible need for additional surgery.  She understands and accepts and agrees with her planned procedure.  Surgical procedure:  After undergoing routine preop evaluation patient received prophylactic antibiotics and interscalene block with Exparel was established in the holding area by the anesthesia department.  Placed supine on the operative underwent smooth induction of a general endotracheal anesthesia.  Placed in the beachchair position and appropriate padding protected.  The left shoulder girdle region was sterilely prepped and draped in standard fashion.  Timeout was called.  An anterior deltopectoral approach left shoulder was made through an 8 cm incision.  Skin flaps elevated dissection carried deeply and electrocautery was used for hemostasis.  The deltopectoral interval was then developed  from proximal to distal with a vein taken laterally the upper centimeter of the pectoralis major tendon was tenotomized to improve exposure conjoined tendon mobilized and retracted medially.  The long head biceps tendon was then unroofed and tenodesed at the upper border of the pectoralis major tendon and the proximal segment was then tenotomized unroofed and excised.  The subscapularis was then divided away from the lesser tuberosity using a "peel" technique and the free margin was intact with a pair of fiber tape sutures.  We then divided the capsular attachments from the anterior inferior and inferior aspect of the humeral neck.  Humeral head was delivered through the wound we outlined our proposed humeral head resection with the extra medullary guide and the oscillating saw was then used to resect the humeral head.  Metal cap was then placed over the cut surface of the proximal humerus after we remove the remaining peripheral osteophytes.  This point we then exposed glenoid with combination of Fukuda, pitchfork and snake tongue retractors.  A circumferential labral resection was then performed getting complete visualization of the periphery of the glenoid.  A guidepin was then placed into the center of the glenoid with a slight approximate 10 degree inferior tilt and we prepared the glenoid and I should mention that the glenoid vault was significantly osteoporotic with a very thin medial wall of the central aspect of the glenoid so this did not hold her guidepin we able to ream appropriately without the guidepin.  A stable subchondral bony bed was achieved for baseplate.  Once reaming was completed baseplate was then packed and.  The inferior and superior locking screws were then placed and because there is no purchase centrally we packed bone  graft through the central hole into the glenoid vault and then used a locking central screw.  A 36+4 glenosphere was then placed onto the baseplate after he completed  peripheral reaming and good stability was achieved.  This point we returned our attention back to the proximal humerus the canal was hand reamed up to size 6 broached up to size 6 with excellent fit fixation.  The metaphysis was then reamed and a trial implant was placed and trial reduction was performed showing good soft tissue balance good stability.  Our final implant was assembled on the back table it was impacted into the humeral canal with excellent fit fixation.  We then again performed trial reduction and +3 poly-showed excellent soft tissue balance.  The final +3 poly-was then impacted on your stem final reduction was then performed again confirmed that there was excellent soft tissue balance good shoulder motion good stability.  The subscapularis was then repaired back to the lesser tuberosity portion of the metaphysis using the eyelets on the collar of our humeral stem.  The shoulder was taken through range of motion showing excellent stability and no evidence for excessive tension with external rotation.  The deltopectoral interval was then reapproximated with a series of figure-of-eight #1 Vicryl sutures.  To moderate use of the subcu layer intracuticular through Monocryl for the skin followed by Dermabond and Aquasol dressing left arm was then placed in a sling and patient was awakened extubated and taken to the recovery room in stable condition  Jenetta Loges PA-C was used as an Environmental consultant throughout this case essential for help with positioning the patient, position extremity, tissue manipulation, implantation prosthesis, suture management, and intraoperative decision-making  Kipp Laurence # [phone]

## 2017-07-30 NOTE — H&P (Signed)
[name]    Chief Complaint: Left Shoulder osteoarthritis HPI: The patient is a 72 y.o. female with end stage left shoulder rotator cuff tear arthropathy  Past Medical History:  Diagnosis Date  . Arthritis   . Headache   . Hypertension   . Hypothyroidism   . Pneumonia    hx  . Rotator cuff tear arthropathy, right     Past Surgical History:  Procedure Laterality Date  . APPENDECTOMY    . BLADDER SUSPENSION  2011  . COLONOSCOPY    . JOINT REPLACEMENT    . 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 REVERSE SHOULDER ARTHROPLASTY Right 05/14/2017   Procedure: RIGHT REVERSE SHOULDER ARTHROPLASTY;  Surgeon: Justice Britain, MD;  Location: [facility];  Service: Orthopedics;  Laterality: Right;  . right knee replacment  08  . SACROILIAC JOINT FUSION Right 12/27/2014   Procedure: RIGHT SACROILIAC JOINT FUSION;  Surgeon: Melina Schools, MD;  Location: [facility];  Service: Orthopedics;  Laterality: Right;  right sacral fusion  . TONSILLECTOMY    . TOTAL HIP ARTHROPLASTY  09/08/2011   Procedure: TOTAL HIP ARTHROPLASTY;  Surgeon: Gearlean Alf, MD;  Location: [facility];  Service: Orthopedics;  Laterality: Right;    Family History  Problem Relation Age of Onset  . Heart disease Father     Social History:  reports that she has never smoked. She has never used smokeless tobacco. She reports that she does not drink alcohol or use drugs.   Medications Prior to Admission  Medication Sig Dispense Refill  . ASHWAGANDHA PO Take 1 capsule by mouth 2 (two) times daily.    Marland Kitchen aspirin 81 MG tablet Take 81 mg by mouth at bedtime.    . Calcium Carbonate-Vitamin D (CALTRATE 600+D) 600-400 MG-UNIT tablet Take 2 tablets by mouth daily.    . Carboxymethylcellul-Glycerin ([employer] LUBRICATING/DRY EYE OP) Place 1 drop into both eyes 3 (three) times daily as needed (for dry eyes).    . cetirizine (ZYRTEC) 10 MG tablet Take 10 mg by mouth daily.    . Cholecalciferol  (VITAMIN D3) 5000 UNITS TABS Take 5,000 Units by mouth at bedtime.     . Coenzyme Q10 (CO Q 10) 100 MG CAPS Take 100 mg by mouth every morning.     . gabapentin (NEURONTIN) 300 MG capsule Take 300 mg by mouth See admin instructions. Take 300 mg by mouth at bedtime. May take additional 300 mg if needed for pain    . Krill Oil 1000 MG CAPS Take 1,000 mg by mouth 2 (two) times daily.     Marland Kitchen levothyroxine (SYNTHROID, LEVOTHROID) 88 MCG tablet Take 88 mcg by mouth daily before breakfast.     . liothyronine (CYTOMEL) 5 MCG tablet Take 5 mcg by mouth daily.    Marland Kitchen lisinopril-hydrochlorothiazide (PRINZIDE,ZESTORETIC) 20-25 MG tablet Take 1 tablet by mouth daily with breakfast.    . MILK THISTLE PO Take 1 capsule by mouth daily.     . Multiple Vitamins-Minerals (MULTIVITAMIN WITH MINERALS) tablet Take 1 tablet by mouth at bedtime.    . Probiotic Product (PROBIOTIC PO) Take 1 capsule by mouth daily.    . rosuvastatin (CRESTOR) 10 MG tablet Take 10 mg by mouth at bedtime.    . temazepam (RESTORIL) 30 MG capsule Take 30 mg by mouth at bedtime.    Marland Kitchen guaiFENesin (MUCINEX) 600 MG 12 hr tablet Take 600 mg by mouth daily as  needed for cough or to loosen phlegm.     Marland Kitchen HYDROmorphone (DILAUDID) 2 MG tablet Take 1 tablet (2 mg total) by mouth every 4 (four) hours as needed for severe pain (pain not controlled by ibuprofen). (Patient not taking: Reported on 07/20/2017) 30 tablet 0  . polyethylene glycol powder (GLYCOLAX) powder Take 17 g by mouth daily. (Patient not taking: Reported on 05/05/2017) 255 g 1     Physical Exam: L shoulder with painful and restricted motion as noted at recent office visits  Vitals  Temp:  [98 F (36.7 C)] 98 F (36.7 C) (06/13 1058) Pulse Rate:  [83] 83 (06/13 1058) Resp:  [20] 20 (06/13 1058) BP: (170-171)/(69-80) 170/80 (06/13 1126) SpO2:  [99 %] 99 % (06/13 1058)  Assessment/Plan  Impression: Left Shoulder osteoarthritis  Plan of Action: Procedure(s): REVERSE SHOULDER  ARTHROPLASTY  [name] 07/30/2017, 12:12 PM Contact # [phone]

## 2017-07-30 NOTE — Discharge Instructions (Signed)
° °Kevin M. Supple, M.D., F.A.A.O.S. °Orthopaedic Surgery °Specializing in Arthroscopic and Reconstructive °Surgery of the Shoulder and Knee °[phone] °[address] - [place], [place] [address] - Fax [phone] ° ° °POST-OP TOTAL SHOULDER REPLACEMENT INSTRUCTIONS ° °1. Call the office at [phone] to schedule your first post-op appointment 10-14 days from the date of your surgery. ° °2. The bandage over your incision is waterproof. You may begin showering with this dressing on. You may leave this dressing on until first follow up appointment within 2 weeks. We prefer you leave this dressing in place until follow up however after 5-7 days if you are having itching or skin irritation and would like to remove it you may do so. Go slow and tug at the borders gently to break the bond the dressing has with the skin. At this point if there is no drainage it is okay to go without a bandage or you may cover it with a light guaze and tape. You can also expect significant bruising around your shoulder that will drift down your arm and into your chest wall. This is very normal and should resolve over several days. ° ° 3. Wear your sling/immobilizer at all times except to perform the exercises below or to occasionally let your arm dangle by your side to stretch your elbow. You also need to sleep in your sling immobilizer until instructed otherwise. ° °4. Range of motion to your elbow, wrist, and hand are encouraged 3-5 times daily. Exercise to your hand and fingers helps to reduce swelling you may experience. ° °5. Utilize ice to the shoulder 3-5 times minimum a day and additionally if you are experiencing pain. ° °6. Prescriptions for a pain medication and a muscle relaxant are provided for you. It is recommended that if you are experiencing pain that you pain medication alone is not controlling, add the muscle relaxant along with the pain medication which can give additional pain relief. The first 1-2 days  is generally the most severe of your pain and then should gradually decrease. As your pain lessens it is recommended that you decrease your use of the pain medications to an "as needed basis'" only and to always comply with the recommended dosages of the pain medications. ° °7. Pain medications can produce constipation along with their use. If you experience this, the use of an over the counter stool softener or laxative daily is recommended.  ° °8. For additional questions or concerns, please do not hesitate to call the office. If after hours there is an answering service to forward your concerns to the physician on call. ° °9.Pain control following an exparel block ° °To help control your post-operative pain you received a nerve block  performed with Exparel which is a long acting anesthetic (numbing agent) which can provide pain relief and sensations of numbness (and relief of pain) in the operative shoulder and arm for up to 3 days. Sometimes it provides mixed relief, meaning you may still have numbness in certain areas of the arm but can still be able to move  parts of that arm, hand, and fingers. We recommend that your prescribed pain medications  be used as needed. We do not feel it is necessary to "pre medicate" and "stay ahead" of pain.  Taking narcotic pain medications when you are not having any pain can lead to unnecessary and potentially dangerous side effects.  ° °POST-OP EXERCISES ° °Pendulum Exercises ° °Perform pendulum exercises while standing and bending at   the waist. Support your uninvolved arm on a table or chair and allow your operated arm to hang freely. Make sure to do these exercises passively - not using you shoulder muscles. ° °Repeat 20 times. Do 3 sessions per day. ° ° ° ° °

## 2017-07-30 NOTE — Progress Notes (Signed)
Patient transferred to 5N24, receiving RN at bedside, call bell given to patient, bed alarm on, family notified of patients location.  Rowe Pavy, RN

## 2017-07-30 NOTE — Anesthesia Procedure Notes (Signed)
Anesthesia Regional Block: Interscalene brachial plexus block   Pre-Anesthetic Checklist: ,, timeout performed, Correct Patient, Correct Site, Correct Laterality, Correct Procedure, Correct Position, site marked, Risks and benefits discussed,  Surgical consent,  Pre-op evaluation,  At surgeon's request and post-op pain management  Laterality: Upper and Left  Prep: chloraprep       Needles:  Injection technique: Single-shot  Needle Type: Echogenic Stimulator Needle          Additional Needles:   Procedures:,,,, ultrasound used (permanent image in chart),,,,  Narrative:  Start time: 07/30/2017 12:46 PM End time: 07/30/2017 12:50 PM Injection made incrementally with aspirations every 5 mL.  Performed by: Personally  Anesthesiologist: Oleta Mouse, MD  Additional Notes: H+P and labs reviewed, risks and benefits discussed with patient, procedure tolerated well without complications

## 2017-07-31 NOTE — Progress Notes (Signed)
OT Cancellation Note and Discharge  Patient Details Name: [name] MRN: [id number] DOB: [birth year]   Cancelled Treatment:    Reason Eval/Treat Not Completed: Other (comment). Order received and discontinue order received---per chart pt already familiar with exercises and ADLs from prior shoulder surgery so no OT needed.   Almon Register [id number] 07/31/2017, 8:29 AM

## 2017-07-31 NOTE — Progress Notes (Signed)
Written and verbal discharge instructions given to the patient and her husband.  The patient verbalizes understanding those discharge instructions.  Patient was discharged to home via wheel chair to car.

## 2017-07-31 NOTE — Discharge Summary (Signed)
PATIENT ID:      [name]  MRN:     [id number] DOB/AGE:    1946/01/12 / 72 y.o.     DISCHARGE SUMMARY  ADMISSION DATE:    07/30/2017 DISCHARGE DATE:    ADMISSION DIAGNOSIS: Left Shoulder osteoarthritis Past Medical History:  Diagnosis Date  . Arthritis   . Headache   . Hypertension   . Hypothyroidism   . Pneumonia    hx  . Rotator cuff tear arthropathy, right     DISCHARGE DIAGNOSIS:   Active Problems:   S/p reverse total shoulder arthroplasty   PROCEDURE: Procedure(s): REVERSE SHOULDER ARTHROPLASTY on 07/30/2017  CONSULTS:    HISTORY:  See H&P in chart.  HOSPITAL COURSE:  Wendy ANDRINGA is a 72 y.o. admitted on 07/30/2017 with a diagnosis of Left Shoulder osteoarthritis.  They were brought to the operating room on 07/30/2017 and underwent Procedure(s): [place].    They were given perioperative antibiotics:  Anti-infectives (From admission, onward)   Start     Dose/Rate Route Frequency Ordered Stop   07/30/17 1200  ceFAZolin (ANCEF) IVPB 2g/100 mL premix     2 g 200 mL/hr over 30 Minutes Intravenous To ShortStay Surgical 07/29/17 1127 07/30/17 1351    .  Patient underwent the above named procedure and tolerated it well. The following day they were hemodynamically stable and pain was controlled on oral analgesics. They were neurovascularly intact to the operative extremity, although still with lingering effects of block as expected. OT was ordered but cancelled because patient understood all exercises as they had just had surgery on other shoulder. They were medically and orthopaedically stable for discharge on day 1 .   DIAGNOSTIC STUDIES:  RECENT RADIOGRAPHIC STUDIES :  No results found.  RECENT VITAL SIGNS:   Patient Vitals for the past 24 hrs:  BP Temp Temp src Pulse Resp SpO2 Height Weight  07/31/17 0323 (!) 135/53 98 F (36.7 C) Oral 81 16 94 % - -  07/31/17 0002 (!) 122/56 98.7 F (37.1 C) Oral 89 16 93 % - -  07/30/17 2043 (!)  143/61 97.9 F (36.6 C) Oral 91 16 94 % - -  07/30/17 1953 (!) 142/62 97.7 F (36.5 C) Oral 87 16 95 % - -  07/30/17 1707 - - - - - - - 86.4 kg (190 lb 8 oz)  07/30/17 1706 - - - - - - 5' 3.5" (1.613 m) -  07/30/17 1643 (!) 156/80 97.8 F (36.6 C) Oral 85 16 96 % - -  07/30/17 1641 - - - 84 16 95 % - -  07/30/17 1615 - 97.6 F (36.4 C) - 82 14 97 % - -  07/30/17 1610 - - - 81 15 97 % - -  07/30/17 1605 (!) 157/77 - - 81 16 96 % - -  07/30/17 1600 - - - 82 20 96 % - -  07/30/17 1555 - - - 79 14 94 % - -  07/30/17 1550 (!) 150/73 97.6 F (36.4 C) - 80 12 96 % - -  07/30/17 1545 - - - 81 17 96 % - -  07/30/17 1540 - - - 82 10 97 % - -  07/30/17 1535 (!) 171/68 - - 84 15 95 % - -  07/30/17 1530 - - - 87 13 96 % - -  07/30/17 1525 - - - 88 16 95 % - -  07/30/17 1520 (!) 172/82 - - 87 (!) 25  100 % - -  07/30/17 1235 - - - 69 12 97 % - -  07/30/17 1220 - - - 73 13 96 % - -  07/30/17 1126 (!) 170/80 - - - - - - -  07/30/17 1104 (!) 171/69 - - - - - - -  07/30/17 1058 (!) 171/74 98 F (36.7 C) Oral 83 20 99 % - -  .  RECENT EKG RESULTS:    Orders placed or performed during the hospital encounter of 12/20/14  . EKG 12-Lead  . EKG 12-Lead    DISCHARGE INSTRUCTIONS:  Discharge Instructions    [name]    Discharge disposition:  01-Home or Self Care   Discharge patient date:  07/31/2017      DISCHARGE MEDICATIONS:   Allergies as of 07/31/2017      Reactions   Atorvastatin Other (See Comments)   ALOPECIA [08 Mar 2013]   Oxycodone Itching, Other (See Comments)   Can take with antihistamines       Medication List    TAKE these medications   ASHWAGANDHA PO Take 1 capsule by mouth 2 (two) times daily.   aspirin 81 MG tablet Take 81 mg by mouth at bedtime.   CALTRATE 600+D 600-400 MG-UNIT tablet Generic drug:  Calcium Carbonate-Vitamin D Take 2 tablets by mouth daily.   cetirizine 10 MG tablet Commonly known as:  ZYRTEC Take 10  mg by mouth daily.   Co Q 10 100 MG Caps Take 100 mg by mouth every morning.   [employer] LUBRICATING/DRY EYE OP Place 1 drop into both eyes 3 (three) times daily as needed (for dry eyes).   gabapentin 300 MG capsule Commonly known as:  NEURONTIN Take 300 mg by mouth See admin instructions. Take 300 mg by mouth at bedtime. May take additional 300 mg if needed for pain   guaiFENesin 600 MG 12 hr tablet Commonly known as:  MUCINEX Take 600 mg by mouth daily as needed for cough or to loosen phlegm.   HYDROmorphone 2 MG tablet Commonly known as:  DILAUDID Take 1 tablet (2 mg total) by mouth every 4 (four) hours as needed for severe pain (pain not controlled by ibuprofen).   Krill Oil 1000 MG Caps Take 1,000 mg by mouth 2 (two) times daily.   levothyroxine 88 MCG tablet Commonly known as:  SYNTHROID, LEVOTHROID Take 88 mcg by mouth daily before breakfast.   liothyronine 5 MCG tablet Commonly known as:  CYTOMEL Take 5 mcg by mouth daily.   lisinopril-hydrochlorothiazide 20-25 MG tablet Commonly known as:  PRINZIDE,ZESTORETIC Take 1 tablet by mouth daily with breakfast.   MILK THISTLE PO Take 1 capsule by mouth daily.   multivitamin with minerals tablet Take 1 tablet by mouth at bedtime.   polyethylene glycol powder powder Commonly known as:  GLYCOLAX Take 17 g by mouth daily.   PROBIOTIC PO Take 1 capsule by mouth daily.   rosuvastatin 10 MG tablet Commonly known as:  CRESTOR Take 10 mg by mouth at bedtime.   temazepam 30 MG capsule Commonly known as:  RESTORIL Take 30 mg by mouth at bedtime.   Vitamin D3 5000 units Tabs Take 5,000 Units by mouth at bedtime.       FOLLOW UP VISIT:   Follow-up Information    Justice Britain, MD.   Specialty:  Orthopedic Surgery Why:  call to be seen in 10-14 days Contact information: [address]  [phone]           DISCHARGE TO: Home   DISCHARGE CONDITION:  [name]  [address] for Dr. Justice Britain 07/31/2017, 8:23 AM

## 2017-08-04 ENCOUNTER — Encounter (HOSPITAL_COMMUNITY): Payer: Self-pay | Admitting: Orthopedic Surgery

## 2017-10-21 ENCOUNTER — Other Ambulatory Visit: Payer: Self-pay | Admitting: Internal Medicine

## 2017-10-21 DIAGNOSIS — Z1231 Encounter for screening mammogram for malignant neoplasm of breast: Secondary | ICD-10-CM

## 2017-12-14 ENCOUNTER — Ambulatory Visit
Admission: RE | Admit: 2017-12-14 | Discharge: 2017-12-14 | Disposition: A | Discharge status: Home / Self Care | Payer: Medicare Other | Source: Ambulatory Visit | Attending: Internal Medicine | Admitting: Internal Medicine

## 2017-12-14 DIAGNOSIS — Z1231 Encounter for screening mammogram for malignant neoplasm of breast: Secondary | ICD-10-CM

## 2018-02-01 ENCOUNTER — Ambulatory Visit (INDEPENDENT_AMBULATORY_CARE_PROVIDER_SITE_OTHER): Payer: Medicare Other | Admitting: Orthopaedic Surgery

## 2018-02-01 ENCOUNTER — Encounter (INDEPENDENT_AMBULATORY_CARE_PROVIDER_SITE_OTHER): Payer: Self-pay | Admitting: Orthopaedic Surgery

## 2018-02-01 ENCOUNTER — Other Ambulatory Visit (INDEPENDENT_AMBULATORY_CARE_PROVIDER_SITE_OTHER): Payer: Self-pay | Admitting: Orthopaedic Surgery

## 2018-02-01 DIAGNOSIS — M1712 Unilateral primary osteoarthritis, left knee: Secondary | ICD-10-CM | POA: Diagnosis not present

## 2018-02-01 DIAGNOSIS — G8929 Other chronic pain: Secondary | ICD-10-CM

## 2018-02-01 DIAGNOSIS — M25562 Pain in left knee: Secondary | ICD-10-CM | POA: Diagnosis not present

## 2018-02-01 MED ORDER — TRAMADOL HCL 50 MG PO TABS: 50.0000 mg | ORAL_TABLET | Freq: Two times a day (BID) | ORAL | 0 refills | Status: DC | PRN

## 2018-02-01 NOTE — Progress Notes (Signed)
Office Visit Note   Patient: [name]           Date of Birth: Sep 26, 1945           MRN: [id number] Visit Date: 02/01/2018              Requested by: Kristopher Glee., MD [address] [place], [place] [address] PCP: Kristopher Glee., MD   Assessment & Plan: Visit Diagnoses:  1. Unilateral primary osteoarthritis, left knee   2. Chronic pain of left knee     Plan: We went over her knee in detail as well as her x-rays and model showing knee replacement surgery.  I explained my approach to knee replacement surgery and talked about the risk and benefits of this surgery.  We had a long and thorough discussion about her intraoperative and postoperative course.  All question concerns were answered and addressed.  We will work on getting this surgery scheduled soon.  Again I told her that her primary orthopedic surgeon is wonderful and she agrees, however, she still would like me to perform her left knee replacement at a more convenient date for her given the detrimental effect that her knee pain is now having on her mobility, her quality of life and her activities daily living.  Follow-Up Instructions: Return for 2 weeks post-op.   Orders:  No orders of the defined types were placed in this encounter.  No orders of the defined types were placed in this encounter.     Procedures: No procedures performed   Clinical Data: No additional findings.   Subjective: Chief Complaint  Patient presents with  . Left Knee - Pain  The patient is a very pleasant and active 72 year old female with left knee pain that is chronic.  She has known severe osteoarthritis of the left knee.  She is actually scheduled with 1 of my colleagues to have a left total knee arthroplasty at the end of February 2020.  She has been very satisfied with her main orthopedic surgeon but was referred to me from friends and other people to have operated on.  She would like to have her surgery moved up and  they are not able to accommodate her at the other practice.  Her pain is daily and it is detrimentally affect her actives daily living, her mobility and her quality of life.  She is on gabapentin and puts Aspercreme on her knee and wears a knee brace.  She is having a lot of problems going up and down stairs.  Her pain can be 10 out of 10 at times.  It is waking her up at night.  She has a long surgical history of multiple joint replacements of other joints.  She played tennis for many years and had a lot of wear and tear on her joints.  She has been very satisfied with her other joint replacements.  She understands the rehabilitation that he takes for knee replacement surgery having had this before on her right knee.  She is already tried and failed all forms of conservative treatment including steroid injections and hyaluronic acid injections.  She has no active medical problems and is not a diabetic.  HPI  Review of Systems She currently denies any headache, chest pain, shortness of breath, fever, chills, nausea, vomiting.  Objective: Vital Signs: There were no vitals taken for this visit.  Physical Exam She is alert and oriented x3 and in no acute distress. Ortho Exam  Examination of her left knee shows full range of motion but a painful arc of motion.  There is patellofemoral cavitation.  There is significant medial and lateral joint line pain and a mild effusion.  There is no instability on ligamentous exam. Specialty Comments:  No specialty comments available.  Imaging: No results found. 2 views of her left knee that accompany her do show significant medial and lateral joint space narrowing.  There is significant particular osteophytes in all 3 compartments.  The alignment is overall neutral.    PMFS History: Patient Active Problem List   Diagnosis Date Noted  . Unilateral primary osteoarthritis, left knee 02/01/2018  . 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 SI  (sacroiliac) pain 12/27/2014  . Postop Hyponatremia 09/10/2011  . OA (osteoarthritis) of hip 09/08/2011   Past Medical History:  Diagnosis Date  . Arthritis   . Headache   . Hypertension   . Hypothyroidism   . Pneumonia    hx  . Rotator cuff tear arthropathy, right     Family History  Problem Relation Age of Onset  . Heart disease Father     Past Surgical History:  Procedure Laterality Date  . APPENDECTOMY    . BLADDER SUSPENSION  2011  . COLONOSCOPY    . JOINT REPLACEMENT    . 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 REVERSE SHOULDER ARTHROPLASTY Right 05/14/2017   Procedure: RIGHT REVERSE SHOULDER ARTHROPLASTY;  Surgeon: Justice Britain, MD;  Location: [facility];  Service: Orthopedics;  Laterality: Right;  . REVERSE SHOULDER ARTHROPLASTY Left 07/30/2017   Procedure: REVERSE SHOULDER ARTHROPLASTY;  Surgeon: Justice Britain, MD;  Location: [facility];  Service: Orthopedics;  Laterality: Left;  . right knee replacment  08  . SACROILIAC JOINT FUSION Right 12/27/2014   Procedure: RIGHT SACROILIAC JOINT FUSION;  Surgeon: Melina Schools, MD;  Location: [facility];  Service: Orthopedics;  Laterality: Right;  right sacral fusion  . TONSILLECTOMY    . TOTAL HIP ARTHROPLASTY  09/08/2011   Procedure: TOTAL HIP ARTHROPLASTY;  Surgeon: Gearlean Alf, MD;  Location: [facility];  Service: Orthopedics;  Laterality: Right;   Social History   Occupational History  . Not on file  Tobacco Use  . Smoking status: Never Smoker  . Smokeless tobacco: Never Used  Substance and Sexual Activity  . Alcohol use: No  . Drug use: No  . Sexual activity: Not on file

## 2018-02-16 ENCOUNTER — Encounter (HOSPITAL_COMMUNITY): Payer: Self-pay

## 2018-02-16 NOTE — Patient Instructions (Addendum)
Your procedure is scheduled on:  Friday, Jan. 10, 2020   Surgery Time:  7:15AM-8:15AM   Report to [facility]  Entrance    Report to admitting at 5:30 AM   Call this number if you have problems the morning of surgery [phone]   Do not eat food or drink liquids :After Midnight.   Brush your teeth the morning of surgery.   Do NOT smoke after Midnight   Take these medicines the morning of surgery with A SIP OF WATER: Zyrtec, Levothyroxine, Liothyronine                               You may not have any metal on your body including hair pins, jewelry, and body piercings             Do not wear make-up, lotions, powders, perfumes/cologne, or deodorant             Do not wear nail polish.  Do not shave  48 hours prior to surgery.                Do not bring valuables to the hospital. [place].   Contacts, dentures or bridgework may not be worn into surgery.   Leave suitcase in the car. After surgery it may be brought to your room.    Special Instructions: Bring a copy of your healthcare power of attorney and living will documents         the day of surgery if you haven't scanned them in before.              Please read over the following fact sheets you were given:  [facility] - Preparing for Surgery Before surgery, you can play an important role.  Because skin is not sterile, your skin needs to be as free of germs as possible.  You can reduce the number of germs on your skin by washing with CHG (chlorahexidine gluconate) soap before surgery.  CHG is an antiseptic cleaner which kills germs and bonds with the skin to continue killing germs even after washing. Please DO NOT use if you have an allergy to CHG or antibacterial soaps.  If your skin becomes reddened/irritated stop using the CHG and inform your nurse when you arrive at Short Stay. Do not shave (including legs and underarms) for at least 48 hours prior to  the first CHG shower.  You may shave your face/neck.  Please follow these instructions carefully:  1.  Shower with CHG Soap the night before surgery and the  morning of surgery.  2.  If you choose to wash your hair, wash your hair first as usual with your normal  shampoo.  3.  After you shampoo, rinse your hair and body thoroughly to remove the shampoo.                             4.  Use CHG as you would any other liquid soap.  You can apply chg directly to the skin and wash.  Gently with a scrungie or clean washcloth.  5.  Apply the CHG Soap to your body ONLY FROM THE NECK DOWN.   Do   not use on face/ open  Wound or open sores. Avoid contact with eyes, ears mouth and   genitals (private parts).                       Wash face,  Genitals (private parts) with your normal soap.             6.  Wash thoroughly, paying special attention to the area where your    surgery  will be performed.  7.  Thoroughly rinse your body with warm water from the neck down.  8.  DO NOT shower/wash with your normal soap after using and rinsing off the CHG Soap.                9.  Pat yourself dry with a clean towel.            10.  Wear clean pajamas.            11.  Place clean sheets on your bed the night of your first shower and do not  sleep with pets. Day of Surgery : Do not apply any lotions/deodorants the morning of surgery.  Please wear clean clothes to the hospital/surgery center.  FAILURE TO FOLLOW THESE INSTRUCTIONS MAY RESULT IN THE CANCELLATION OF YOUR SURGERY  PATIENT SIGNATURE_________________________________  NURSE SIGNATURE__________________________________  ________________________________________________________________________   Adam Phenix  An incentive spirometer is a tool that can help keep your lungs clear and active. This tool measures how well you are filling your lungs with each breath. Taking long deep breaths may help reverse or decrease the  chance of developing breathing (pulmonary) problems (especially infection) following:  A long period of time when you are unable to move or be active. BEFORE THE PROCEDURE   If the spirometer includes an indicator to show your best effort, your nurse or respiratory therapist will set it to a desired goal.  If possible, sit up straight or lean slightly forward. Try not to slouch.  Hold the incentive spirometer in an upright position. INSTRUCTIONS FOR USE  1. Sit on the edge of your bed if possible, or sit up as far as you can in bed or on a chair. 2. Hold the incentive spirometer in an upright position. 3. Breathe out normally. 4. Place the mouthpiece in your mouth and seal your lips tightly around it. 5. Breathe in slowly and as deeply as possible, raising the piston or the ball toward the top of the column. 6. Hold your breath for 3-5 seconds or for as long as possible. Allow the piston or ball to fall to the bottom of the column. 7. Remove the mouthpiece from your mouth and breathe out normally. 8. Rest for a few seconds and repeat Steps 1 through 7 at least 10 times every 1-2 hours when you are awake. Take your time and take a few normal breaths between deep breaths. 9. The spirometer may include an indicator to show your best effort. Use the indicator as a goal to work toward during each repetition. 10. After each set of 10 deep breaths, practice coughing to be sure your lungs are clear. If you have an incision (the cut made at the time of surgery), support your incision when coughing by placing a pillow or rolled up towels firmly against it. Once you are able to get out of bed, walk around indoors and cough well. You may stop using the incentive spirometer when instructed by your caregiver.  RISKS AND COMPLICATIONS  Take your time  so you do not get dizzy or light-headed.  If you are in pain, you may need to take or ask for pain medication before doing incentive spirometry. It is harder  to take a deep breath if you are having pain. AFTER USE  Rest and breathe slowly and easily.  It can be helpful to keep track of a log of your progress. Your caregiver can provide you with a simple table to help with this. If you are using the spirometer at home, follow these instructions: [place] IF:   You are having difficultly using the spirometer.  You have trouble using the spirometer as often as instructed.  Your pain medication is not giving enough relief while using the spirometer.  You develop fever of 100.5 F (38.1 C) or higher. SEEK IMMEDIATE MEDICAL CARE IF:   You cough up bloody sputum that had not been present before.  You develop fever of 102 F (38.9 C) or greater.  You develop worsening pain at or near the incision site. MAKE SURE YOU:   Understand these instructions.  Will watch your condition.  Will get help right away if you are not doing well or get worse. Document Released: 06/16/2006 Document Revised: 04/28/2011 Document Reviewed: 08/17/2006 [facility] Patient Information 2014 [place], [place].   ________________________________________________________________________

## 2018-02-18 ENCOUNTER — Other Ambulatory Visit (INDEPENDENT_AMBULATORY_CARE_PROVIDER_SITE_OTHER): Payer: Self-pay | Admitting: Physician Assistant

## 2018-02-19 NOTE — Pre-Procedure Instructions (Signed)
The following are in the chart: EKG 04/13/2017

## 2018-02-22 ENCOUNTER — Encounter (HOSPITAL_COMMUNITY)
Admission: RE | Admit: 2018-02-22 | Discharge: 2018-02-22 | Disposition: A | Discharge status: Home / Self Care | Payer: Medicare Other | Source: Ambulatory Visit | Attending: Orthopaedic Surgery | Admitting: Orthopaedic Surgery

## 2018-02-22 ENCOUNTER — Other Ambulatory Visit (INDEPENDENT_AMBULATORY_CARE_PROVIDER_SITE_OTHER): Payer: Self-pay | Admitting: Physician Assistant

## 2018-02-22 ENCOUNTER — Other Ambulatory Visit: Payer: Self-pay

## 2018-02-22 ENCOUNTER — Encounter (HOSPITAL_COMMUNITY): Payer: Self-pay

## 2018-02-22 DIAGNOSIS — Z01812 Encounter for preprocedural laboratory examination: Secondary | ICD-10-CM | POA: Diagnosis present

## 2018-02-22 HISTORY — DX: Insomnia, unspecified: G47.00

## 2018-02-22 HISTORY — DX: Restless legs syndrome: G25.81

## 2018-02-22 HISTORY — DX: Obesity, unspecified: E66.9

## 2018-02-22 HISTORY — DX: Squamous cell carcinoma of skin of left lower limb, including hip: C44.729

## 2018-02-22 HISTORY — DX: Other intervertebral disc degeneration, lumbar region: M51.36

## 2018-02-22 HISTORY — DX: Unspecified osteoarthritis, unspecified site: M19.90

## 2018-02-22 HISTORY — DX: Hyperlipidemia, unspecified: E78.5

## 2018-02-22 HISTORY — DX: Other specified diseases of liver: K76.89

## 2018-02-22 HISTORY — DX: Anxiety disorder, unspecified: F41.9

## 2018-02-22 HISTORY — DX: Other cervical disc degeneration, unspecified cervical region: M50.30

## 2018-02-22 HISTORY — DX: Other intervertebral disc degeneration, lumbar region without mention of lumbar back pain or lower extremity pain: M51.369

## 2018-02-22 HISTORY — DX: Personal history of colonic polyps: Z86.010

## 2018-02-22 HISTORY — DX: Chondrocostal junction syndrome (tietze): M94.0

## 2018-02-22 HISTORY — DX: Personal history of colon polyps, unspecified: Z86.0100

## 2018-02-22 HISTORY — DX: Urge incontinence: N39.41

## 2018-02-22 HISTORY — DX: Allergic rhinitis, unspecified: J30.9

## 2018-02-22 HISTORY — DX: Venous insufficiency (chronic) (peripheral): I87.2

## 2018-02-22 LAB — BASIC METABOLIC PANEL
Anion gap: 9 (ref 5–15)
BUN: 13 mg/dL (ref 8–23)
CALCIUM: 9.5 mg/dL (ref 8.9–10.3)
CHLORIDE: 104 mmol/L (ref 98–111)
CO2: 31 mmol/L (ref 22–32)
Creatinine, Ser: 0.78 mg/dL (ref 0.44–1.00)
GFR calc Af Amer: >60 mL/min (ref >60)
GFR calc non Af Amer: >60 mL/min (ref >60)
Glucose, Bld: 108 mg/dL — ABNORMAL HIGH (ref 70–99)
Potassium: 4.4 mmol/L (ref 3.5–5.1)
Sodium: 144 mmol/L (ref 135–145)

## 2018-02-22 LAB — CBC
HCT: 42 % (ref 36.0–46.0)
Hemoglobin: 13.9 g/dL (ref 12.0–15.0)
MCH: 30.2 pg (ref 26.0–34.0)
MCHC: 33.1 g/dL (ref 30.0–36.0)
MCV: 91.1 fL (ref 80.0–100.0)
Platelets: 184 10*3/uL (ref 150–400)
RBC: 4.61 MIL/uL (ref 3.87–5.11)
RDW: 11.9 % (ref 11.5–15.5)
WBC: 4.3 10*3/uL (ref 4.0–10.5)
nRBC: 0 % (ref 0.0–0.2)

## 2018-02-22 LAB — SURGICAL PCR SCREEN
MRSA, PCR: NEGATIVE
STAPHYLOCOCCUS AUREUS: NEGATIVE

## 2018-02-22 NOTE — Progress Notes (Signed)
BMP results 02/22/2018 sent to Dr. Ninfa Linden via epic.

## 2018-02-25 ENCOUNTER — Encounter (HOSPITAL_COMMUNITY): Payer: Self-pay | Admitting: Anesthesiology

## 2018-02-25 NOTE — Anesthesia Preprocedure Evaluation (Addendum)
Anesthesia Evaluation  Patient identified by MRN, date of birth, ID band Patient awake    Reviewed: Allergy & Precautions, NPO status , Patient's Chart, lab work & pertinent test results  Airway Mallampati: II  TM Distance: >3 FB Neck ROM: Full    Dental  (+) Teeth Intact, Dental Advisory Given   Pulmonary    breath sounds clear to auscultation       Cardiovascular hypertension, Pt. on medications  Rhythm:Regular Rate:Normal     Neuro/Psych Anxiety    GI/Hepatic negative GI ROS, Neg liver ROS,   Endo/Other  Hypothyroidism   Renal/GU negative Renal ROS     Musculoskeletal  (+) Arthritis ,   Abdominal (+) + obese,   Peds  Hematology   Anesthesia Other Findings   Reproductive/Obstetrics                            Anesthesia Physical Anesthesia Plan  ASA: III  Anesthesia Plan: Spinal   Post-op Pain Management:  Regional for Post-op pain   Induction: Intravenous  PONV Risk Score and Plan: Ondansetron, Propofol infusion, Treatment may vary due to age or medical condition and Dexamethasone  Airway Management Planned: Natural Airway and Simple Face Mask  Additional Equipment: None  Intra-op Plan:   Post-operative Plan:   Informed Consent: I have reviewed the patients History and Physical, chart, labs and discussed the procedure including the risks, benefits and alternatives for the proposed anesthesia with the patient or authorized representative who has indicated his/her understanding and acceptance.     Plan Discussed with: CRNA  Anesthesia Plan Comments:        Anesthesia Quick Evaluation

## 2018-02-26 ENCOUNTER — Encounter (HOSPITAL_COMMUNITY): Payer: Self-pay | Admitting: *Deleted

## 2018-02-26 ENCOUNTER — Ambulatory Visit (HOSPITAL_COMMUNITY): Payer: Medicare Other | Admitting: Anesthesiology

## 2018-02-26 ENCOUNTER — Inpatient Hospital Stay (HOSPITAL_COMMUNITY): Payer: Medicare Other

## 2018-02-26 ENCOUNTER — Other Ambulatory Visit: Payer: Self-pay

## 2018-02-26 ENCOUNTER — Encounter (HOSPITAL_COMMUNITY)
Admission: RE | Disposition: A | Discharge status: Home Health | Payer: Self-pay | Source: Home / Self Care | Attending: Orthopaedic Surgery

## 2018-02-26 ENCOUNTER — Inpatient Hospital Stay (HOSPITAL_COMMUNITY)
Admission: RE | Admit: 2018-02-26 | Discharge: 2018-02-27 | DRG: 470 | Disposition: A | Discharge status: Home Health | Payer: Medicare Other | Attending: Orthopaedic Surgery | Admitting: Orthopaedic Surgery

## 2018-02-26 ENCOUNTER — Ambulatory Visit (HOSPITAL_COMMUNITY): Payer: Medicare Other | Admitting: Physician Assistant

## 2018-02-26 DIAGNOSIS — Z888 Allergy status to other drugs, medicaments and biological substances status: Secondary | ICD-10-CM | POA: Diagnosis not present

## 2018-02-26 DIAGNOSIS — F419 Anxiety disorder, unspecified: Secondary | ICD-10-CM | POA: Diagnosis present

## 2018-02-26 DIAGNOSIS — Z96611 Presence of right artificial shoulder joint: Secondary | ICD-10-CM | POA: Diagnosis present

## 2018-02-26 DIAGNOSIS — I10 Essential (primary) hypertension: Secondary | ICD-10-CM | POA: Diagnosis present

## 2018-02-26 DIAGNOSIS — E039 Hypothyroidism, unspecified: Secondary | ICD-10-CM | POA: Diagnosis present

## 2018-02-26 DIAGNOSIS — E785 Hyperlipidemia, unspecified: Secondary | ICD-10-CM | POA: Diagnosis present

## 2018-02-26 DIAGNOSIS — M5136 Other intervertebral disc degeneration, lumbar region: Secondary | ICD-10-CM | POA: Diagnosis present

## 2018-02-26 DIAGNOSIS — M1712 Unilateral primary osteoarthritis, left knee: Principal | ICD-10-CM | POA: Diagnosis present

## 2018-02-26 DIAGNOSIS — Z96651 Presence of right artificial knee joint: Secondary | ICD-10-CM | POA: Diagnosis present

## 2018-02-26 DIAGNOSIS — Z8249 Family history of ischemic heart disease and other diseases of the circulatory system: Secondary | ICD-10-CM

## 2018-02-26 DIAGNOSIS — Z8701 Personal history of pneumonia (recurrent): Secondary | ICD-10-CM

## 2018-02-26 DIAGNOSIS — Z96612 Presence of left artificial shoulder joint: Secondary | ICD-10-CM | POA: Diagnosis present

## 2018-02-26 DIAGNOSIS — Z96652 Presence of left artificial knee joint: Secondary | ICD-10-CM

## 2018-02-26 DIAGNOSIS — Z981 Arthrodesis status: Secondary | ICD-10-CM | POA: Diagnosis not present

## 2018-02-26 DIAGNOSIS — Z885 Allergy status to narcotic agent status: Secondary | ICD-10-CM | POA: Diagnosis not present

## 2018-02-26 DIAGNOSIS — Z96643 Presence of artificial hip joint, bilateral: Secondary | ICD-10-CM | POA: Diagnosis present

## 2018-02-26 DIAGNOSIS — Z85828 Personal history of other malignant neoplasm of skin: Secondary | ICD-10-CM

## 2018-02-26 DIAGNOSIS — G2581 Restless legs syndrome: Secondary | ICD-10-CM | POA: Diagnosis present

## 2018-02-26 HISTORY — PX: TOTAL KNEE ARTHROPLASTY: SHX125

## 2018-02-26 SURGERY — ARTHROPLASTY, KNEE, TOTAL
Anesthesia: Spinal | Site: Knee | Laterality: Left

## 2018-02-26 MED ORDER — MEPERIDINE HCL 50 MG/ML IJ SOLN: 6.2500 mg | INTRAMUSCULAR | Status: DC | PRN

## 2018-02-26 MED ORDER — HYDROCODONE-ACETAMINOPHEN 5-325 MG PO TABS
1.0000 | ORAL_TABLET | ORAL | Status: DC | PRN
  Administered 2018-02-26: 1 via ORAL
  Administered 2018-02-26 – 2018-02-27 (×5): 2 via ORAL
  Filled 2018-02-26: qty 1
  Filled 2018-02-26 (×6): qty 2

## 2018-02-26 MED ORDER — METHOCARBAMOL 500 MG PO TABS
500.0000 mg | ORAL_TABLET | Freq: Four times a day (QID) | ORAL | Status: DC | PRN
  Administered 2018-02-26 – 2018-02-27 (×3): 500 mg via ORAL
  Filled 2018-02-26 (×3): qty 1

## 2018-02-26 MED ORDER — LACTATED RINGERS IV SOLN
INTRAVENOUS | Status: DC
  Administered 2018-02-26 (×2): via INTRAVENOUS

## 2018-02-26 MED ORDER — ROSUVASTATIN CALCIUM 10 MG PO TABS
10.0000 mg | ORAL_TABLET | Freq: Every day | ORAL | Status: DC
  Administered 2018-02-26: 10 mg via ORAL
  Filled 2018-02-26: qty 1

## 2018-02-26 MED ORDER — ALUM & MAG HYDROXIDE-SIMETH 200-200-20 MG/5ML PO SUSP: 30.0000 mL | ORAL | Status: DC | PRN

## 2018-02-26 MED ORDER — DEXAMETHASONE SODIUM PHOSPHATE 10 MG/ML IJ SOLN
INTRAMUSCULAR | Status: AC
  Filled 2018-02-26: qty 1

## 2018-02-26 MED ORDER — ZOLPIDEM TARTRATE 5 MG PO TABS: 5.0000 mg | ORAL_TABLET | Freq: Every evening | ORAL | Status: DC | PRN

## 2018-02-26 MED ORDER — HYDROMORPHONE HCL 1 MG/ML IJ SOLN
0.5000 mg | INTRAMUSCULAR | Status: DC | PRN
  Administered 2018-02-26: 0.5 mg via INTRAVENOUS
  Administered 2018-02-26 – 2018-02-27 (×2): 1 mg via INTRAVENOUS
  Filled 2018-02-26 (×4): qty 1

## 2018-02-26 MED ORDER — SODIUM CHLORIDE 0.9 % IV SOLN
INTRAVENOUS | Status: DC
  Administered 2018-02-26: 75 mL/h via INTRAVENOUS
  Administered 2018-02-27: 02:00:00 via INTRAVENOUS

## 2018-02-26 MED ORDER — LACTATED RINGERS IV SOLN: INTRAVENOUS | Status: DC

## 2018-02-26 MED ORDER — FENTANYL CITRATE (PF) 100 MCG/2ML IJ SOLN
INTRAMUSCULAR | Status: AC
  Filled 2018-02-26: qty 2

## 2018-02-26 MED ORDER — FENTANYL CITRATE (PF) 100 MCG/2ML IJ SOLN
INTRAMUSCULAR | Status: DC | PRN
  Administered 2018-02-26: 50 ug via INTRAVENOUS

## 2018-02-26 MED ORDER — METHOCARBAMOL 500 MG IVPB - SIMPLE MED
INTRAVENOUS | Status: AC
  Filled 2018-02-26: qty 50

## 2018-02-26 MED ORDER — ONDANSETRON HCL 4 MG PO TABS: 4.0000 mg | ORAL_TABLET | Freq: Four times a day (QID) | ORAL | Status: DC | PRN

## 2018-02-26 MED ORDER — VITAMIN D 25 MCG (1000 UNIT) PO TABS
5000.0000 [IU] | ORAL_TABLET | Freq: Every day | ORAL | Status: DC
  Administered 2018-02-26: 5000 [IU] via ORAL
  Filled 2018-02-26: qty 5

## 2018-02-26 MED ORDER — POLYETHYLENE GLYCOL 3350 17 G PO PACK: 17.0000 g | PACK | Freq: Every day | ORAL | Status: DC | PRN

## 2018-02-26 MED ORDER — DEXAMETHASONE SODIUM PHOSPHATE 10 MG/ML IJ SOLN
INTRAMUSCULAR | Status: DC | PRN
  Administered 2018-02-26: 8 mg via INTRAVENOUS

## 2018-02-26 MED ORDER — OXYCODONE HCL 5 MG PO TABS: 5.0000 mg | ORAL_TABLET | ORAL | Status: DC | PRN

## 2018-02-26 MED ORDER — TEMAZEPAM 15 MG PO CAPS
30.0000 mg | ORAL_CAPSULE | Freq: Every day | ORAL | Status: DC
  Administered 2018-02-26: 30 mg via ORAL
  Filled 2018-02-26: qty 2

## 2018-02-26 MED ORDER — LIOTHYRONINE SODIUM 5 MCG PO TABS
5.0000 ug | ORAL_TABLET | Freq: Every day | ORAL | Status: DC
  Administered 2018-02-27: 5 ug via ORAL
  Filled 2018-02-26: qty 1

## 2018-02-26 MED ORDER — STERILE WATER FOR IRRIGATION IR SOLN
Status: DC | PRN
  Administered 2018-02-26: 1000 mL

## 2018-02-26 MED ORDER — PROPOFOL 10 MG/ML IV BOLUS
INTRAVENOUS | Status: AC
  Filled 2018-02-26: qty 20

## 2018-02-26 MED ORDER — CEFAZOLIN SODIUM-DEXTROSE 2-4 GM/100ML-% IV SOLN
2.0000 g | INTRAVENOUS | Status: AC
  Administered 2018-02-26: 2 g via INTRAVENOUS
  Filled 2018-02-26: qty 100

## 2018-02-26 MED ORDER — SODIUM CHLORIDE (PF) 0.9 % IJ SOLN
INTRAMUSCULAR | Status: AC
  Filled 2018-02-26: qty 50

## 2018-02-26 MED ORDER — ACETAMINOPHEN 325 MG PO TABS
325.0000 mg | ORAL_TABLET | Freq: Four times a day (QID) | ORAL | Status: DC | PRN
  Administered 2018-02-27: 650 mg via ORAL
  Filled 2018-02-26: qty 2

## 2018-02-26 MED ORDER — MIDAZOLAM HCL 5 MG/5ML IJ SOLN
INTRAMUSCULAR | Status: DC | PRN
  Administered 2018-02-26: 1 mg via INTRAVENOUS

## 2018-02-26 MED ORDER — LISINOPRIL 20 MG PO TABS
20.0000 mg | ORAL_TABLET | Freq: Every day | ORAL | Status: DC
  Administered 2018-02-27: 20 mg via ORAL
  Filled 2018-02-26: qty 1

## 2018-02-26 MED ORDER — PROPOFOL 10 MG/ML IV BOLUS
INTRAVENOUS | Status: AC
  Filled 2018-02-26: qty 40

## 2018-02-26 MED ORDER — OXYCODONE HCL 5 MG PO TABS: 10.0000 mg | ORAL_TABLET | ORAL | Status: DC | PRN

## 2018-02-26 MED ORDER — PANTOPRAZOLE SODIUM 40 MG PO TBEC
40.0000 mg | DELAYED_RELEASE_TABLET | Freq: Every day | ORAL | Status: DC
  Filled 2018-02-26 (×2): qty 1

## 2018-02-26 MED ORDER — PHENOL 1.4 % MT LIQD
1.0000 | OROMUCOSAL | Status: DC | PRN
  Filled 2018-02-26: qty 177

## 2018-02-26 MED ORDER — ONDANSETRON HCL 4 MG/2ML IJ SOLN: 4.0000 mg | Freq: Four times a day (QID) | INTRAMUSCULAR | Status: DC | PRN

## 2018-02-26 MED ORDER — 0.9 % SODIUM CHLORIDE (POUR BTL) OPTIME
TOPICAL | Status: DC | PRN
  Administered 2018-02-26: 1000 mL

## 2018-02-26 MED ORDER — GABAPENTIN 400 MG PO CAPS
1500.0000 mg | ORAL_CAPSULE | Freq: Every day | ORAL | Status: DC
  Administered 2018-02-26: 1500 mg via ORAL
  Filled 2018-02-26: qty 3

## 2018-02-26 MED ORDER — CHLORHEXIDINE GLUCONATE 4 % EX LIQD: 60.0000 mL | Freq: Once | CUTANEOUS | Status: DC

## 2018-02-26 MED ORDER — METOCLOPRAMIDE HCL 5 MG/ML IJ SOLN: 5.0000 mg | Freq: Three times a day (TID) | INTRAMUSCULAR | Status: DC | PRN

## 2018-02-26 MED ORDER — CEFAZOLIN SODIUM-DEXTROSE 1-4 GM/50ML-% IV SOLN
1.0000 g | Freq: Four times a day (QID) | INTRAVENOUS | Status: AC
  Administered 2018-02-26 (×2): 1 g via INTRAVENOUS
  Filled 2018-02-26 (×2): qty 50

## 2018-02-26 MED ORDER — MIDAZOLAM HCL 2 MG/2ML IJ SOLN
INTRAMUSCULAR | Status: AC
  Filled 2018-02-26: qty 2

## 2018-02-26 MED ORDER — BUPIVACAINE IN DEXTROSE 0.75-8.25 % IT SOLN
INTRATHECAL | Status: DC | PRN
  Administered 2018-02-26: 1.6 mL via INTRATHECAL

## 2018-02-26 MED ORDER — CO Q 10 100 MG PO CAPS: 100.0000 mg | ORAL_CAPSULE | ORAL | Status: DC

## 2018-02-26 MED ORDER — DOCUSATE SODIUM 100 MG PO CAPS
100.0000 mg | ORAL_CAPSULE | Freq: Two times a day (BID) | ORAL | Status: DC
  Administered 2018-02-26 – 2018-02-27 (×2): 100 mg via ORAL
  Filled 2018-02-26 (×2): qty 1

## 2018-02-26 MED ORDER — MENTHOL 3 MG MT LOZG: 1.0000 | LOZENGE | OROMUCOSAL | Status: DC | PRN

## 2018-02-26 MED ORDER — ONDANSETRON HCL 4 MG/2ML IJ SOLN
INTRAMUSCULAR | Status: DC | PRN
  Administered 2018-02-26: 4 mg via INTRAVENOUS

## 2018-02-26 MED ORDER — FENTANYL CITRATE (PF) 100 MCG/2ML IJ SOLN: 25.0000 ug | INTRAMUSCULAR | Status: DC | PRN

## 2018-02-26 MED ORDER — LEVOTHYROXINE SODIUM 88 MCG PO TABS
88.0000 ug | ORAL_TABLET | Freq: Every day | ORAL | Status: DC
  Administered 2018-02-27: 88 ug via ORAL
  Filled 2018-02-26: qty 1

## 2018-02-26 MED ORDER — TRANEXAMIC ACID-NACL 1000-0.7 MG/100ML-% IV SOLN
1000.0000 mg | INTRAVENOUS | Status: AC
  Administered 2018-02-26: 1000 mg via INTRAVENOUS
  Filled 2018-02-26: qty 100

## 2018-02-26 MED ORDER — LISINOPRIL-HYDROCHLOROTHIAZIDE 20-25 MG PO TABS: 1.0000 | ORAL_TABLET | Freq: Every day | ORAL | Status: DC

## 2018-02-26 MED ORDER — PROPOFOL 500 MG/50ML IV EMUL
INTRAVENOUS | Status: DC | PRN
  Administered 2018-02-26 (×2): 20 mg via INTRAVENOUS
  Administered 2018-02-26: 10 mg via INTRAVENOUS

## 2018-02-26 MED ORDER — METOCLOPRAMIDE HCL 5 MG PO TABS: 5.0000 mg | ORAL_TABLET | Freq: Three times a day (TID) | ORAL | Status: DC | PRN

## 2018-02-26 MED ORDER — LORATADINE 10 MG PO TABS
10.0000 mg | ORAL_TABLET | Freq: Every day | ORAL | Status: DC
  Filled 2018-02-26: qty 1

## 2018-02-26 MED ORDER — ADULT MULTIVITAMIN W/MINERALS CH
1.0000 | ORAL_TABLET | Freq: Every day | ORAL | Status: DC
  Administered 2018-02-26: 1 via ORAL
  Filled 2018-02-26: qty 1

## 2018-02-26 MED ORDER — HYDROCHLOROTHIAZIDE 25 MG PO TABS
25.0000 mg | ORAL_TABLET | Freq: Every day | ORAL | Status: DC
  Administered 2018-02-27: 25 mg via ORAL
  Filled 2018-02-26: qty 1

## 2018-02-26 MED ORDER — ONDANSETRON HCL 4 MG/2ML IJ SOLN
INTRAMUSCULAR | Status: AC
  Filled 2018-02-26: qty 2

## 2018-02-26 MED ORDER — ASPIRIN EC 325 MG PO TBEC
325.0000 mg | DELAYED_RELEASE_TABLET | Freq: Two times a day (BID) | ORAL | Status: DC
  Administered 2018-02-27: 325 mg via ORAL
  Filled 2018-02-26: qty 1

## 2018-02-26 MED ORDER — PROMETHAZINE HCL 25 MG/ML IJ SOLN: 6.2500 mg | INTRAMUSCULAR | Status: DC | PRN

## 2018-02-26 MED ORDER — PROPOFOL 500 MG/50ML IV EMUL
INTRAVENOUS | Status: DC | PRN
  Administered 2018-02-26: 50 ug/kg/min via INTRAVENOUS

## 2018-02-26 MED ORDER — SODIUM CHLORIDE 0.9 % IR SOLN
Status: DC | PRN
  Administered 2018-02-26: 3000 mL

## 2018-02-26 MED ORDER — METHOCARBAMOL 500 MG IVPB - SIMPLE MED
500.0000 mg | Freq: Four times a day (QID) | INTRAVENOUS | Status: DC | PRN
  Administered 2018-02-26: 500 mg via INTRAVENOUS
  Filled 2018-02-26: qty 50

## 2018-02-26 SURGICAL SUPPLY — 63 items
APL SKNCLS STERI-STRIP NONHPOA (GAUZE/BANDAGES/DRESSINGS) ×2
BAG SPEC THK2 15X12 ZIP CLS (MISCELLANEOUS)
BAG ZIPLOCK 12X15 (MISCELLANEOUS) IMPLANT
BANDAGE ACE 6X5 VEL STRL LF (GAUZE/BANDAGES/DRESSINGS) ×3 IMPLANT
BANDAGE ELASTIC 6 VELCRO ST LF (GAUZE/BANDAGES/DRESSINGS) ×2 IMPLANT
BASEPLATE TIBIAL TRIATHALON 3 (Plate) ×2 IMPLANT
BEARIN INSERT TIBIAL SZ 3 11 (Insert) ×3 IMPLANT
BEARING INSERT TIBIAL SZ 3 11 (Insert) IMPLANT
BENZOIN TINCTURE PRP APPL 2/3 (GAUZE/BANDAGES/DRESSINGS) ×4 IMPLANT
BLADE SAG 18X100X1.27 (BLADE) ×2 IMPLANT
BLADE SURG SZ10 CARB STEEL (BLADE) ×6 IMPLANT
BOWL SMART MIX CTS (DISPOSABLE) ×2 IMPLANT
BSPLAT TIB 3 CMNT PRM STRL KN (Plate) ×1 IMPLANT
CEMENT BONE SIMPLEX SPEEDSET (Cement) ×4 IMPLANT
CLOSURE STERI-STRIP 1/2X4 (GAUZE/BANDAGES/DRESSINGS) ×1
CLOSURE WOUND 1/2 X4 (GAUZE/BANDAGES/DRESSINGS) ×1
CLSR STERI-STRIP ANTIMIC 1/2X4 (GAUZE/BANDAGES/DRESSINGS) ×1 IMPLANT
COVER SURGICAL LIGHT HANDLE (MISCELLANEOUS) ×3 IMPLANT
COVER WAND RF STERILE (DRAPES) IMPLANT
CUFF TOURN SGL QUICK 34 (TOURNIQUET CUFF) ×3
CUFF TRNQT CYL 34X4X40X1 (TOURNIQUET CUFF) ×1 IMPLANT
DECANTER SPIKE VIAL GLASS SM (MISCELLANEOUS) IMPLANT
DRAPE U-SHAPE 47X51 STRL (DRAPES) ×3 IMPLANT
DRSG PAD ABDOMINAL 8X10 ST (GAUZE/BANDAGES/DRESSINGS) ×3 IMPLANT
DURAPREP 26ML APPLICATOR (WOUND CARE) ×3 IMPLANT
ELECT REM PT RETURN 15FT ADLT (MISCELLANEOUS) ×3 IMPLANT
FEMORAL PEG DISTAL FIXATION (Orthopedic Implant) ×2 IMPLANT
FEMORAL TRIATH POST STAB  SZ3 (Orthopedic Implant) ×2 IMPLANT
FEMORAL TRIATH POST STAB SZ3 (Orthopedic Implant) IMPLANT
GAUZE SPONGE 4X4 12PLY STRL (GAUZE/BANDAGES/DRESSINGS) ×3 IMPLANT
GAUZE XEROFORM 1X8 LF (GAUZE/BANDAGES/DRESSINGS) IMPLANT
GLOVE BIO SURGEON STRL SZ7.5 (GLOVE) ×3 IMPLANT
GLOVE BIOGEL PI IND STRL 8 (GLOVE) ×2 IMPLANT
GLOVE BIOGEL PI INDICATOR 8 (GLOVE) ×4
GLOVE ECLIPSE 8.0 STRL XLNG CF (GLOVE) ×3 IMPLANT
GOWN STRL REUS W/TWL XL LVL3 (GOWN DISPOSABLE) ×6 IMPLANT
HANDPIECE INTERPULSE COAX TIP (DISPOSABLE) ×3
HOLDER FOLEY CATH W/STRAP (MISCELLANEOUS) ×2 IMPLANT
IMMOBILIZER KNEE 20 (SOFTGOODS) ×3
IMMOBILIZER KNEE 20 THIGH 36 (SOFTGOODS) ×1 IMPLANT
NDL SAFETY ECLIPSE 18X1.5 (NEEDLE) IMPLANT
NEEDLE HYPO 18GX1.5 SHARP (NEEDLE)
NS IRRIG 1000ML POUR BTL (IV SOLUTION) ×3 IMPLANT
PACK TOTAL KNEE CUSTOM (KITS) ×3 IMPLANT
PAD ABD 8X10 STRL (GAUZE/BANDAGES/DRESSINGS) ×2 IMPLANT
PADDING CAST COTTON 6X4 STRL (CAST SUPPLIES) ×4 IMPLANT
PATELLA TRIATHALON (Knees) ×2 IMPLANT
PROTECTOR NERVE ULNAR (MISCELLANEOUS) ×3 IMPLANT
SET HNDPC FAN SPRY TIP SCT (DISPOSABLE) ×1 IMPLANT
SET PAD KNEE POSITIONER (MISCELLANEOUS) ×3 IMPLANT
STAPLER VISISTAT 35W (STAPLE) IMPLANT
STRIP CLOSURE SKIN 1/2X4 (GAUZE/BANDAGES/DRESSINGS) ×1 IMPLANT
SUT MNCRL AB 4-0 PS2 18 (SUTURE) ×2 IMPLANT
SUT VIC AB 0 CT1 27 (SUTURE) ×6
SUT VIC AB 0 CT1 27XBRD ANTBC (SUTURE) ×1 IMPLANT
SUT VIC AB 1 CT1 36 (SUTURE) ×6 IMPLANT
SUT VIC AB 2-0 CT1 27 (SUTURE) ×6
SUT VIC AB 2-0 CT1 TAPERPNT 27 (SUTURE) ×2 IMPLANT
SYR 3ML LL SCALE MARK (SYRINGE) IMPLANT
TRAY FOLEY MTR SLVR 16FR STAT (SET/KITS/TRAYS/PACK) ×3 IMPLANT
WATER STERILE IRR 1000ML POUR (IV SOLUTION) ×3 IMPLANT
WRAP KNEE MAXI GEL POST OP (GAUZE/BANDAGES/DRESSINGS) ×3 IMPLANT
YANKAUER SUCT BULB TIP 10FT TU (MISCELLANEOUS) ×3 IMPLANT

## 2018-02-26 NOTE — Evaluation (Signed)
Physical Therapy Evaluation Patient Details Name: [name] MRN: [id number] DOB: 03-24-1945 Today's Date: 02/26/2018   History of Present Illness  73 yo female s/p L TKA; Hx: R TSA 04/2017, L TSA  07/2017  Clinical Impression  Pt is s/p TKA resulting in the deficits listed below (see PT Problem List). Amb 25' with min assist, min/mod for transfers, should continue to progress well, pt is hopeful to d/c over the weekend;  Pt will benefit from skilled PT to increase their independence and safety with mobility to allow discharge to the venue listed below.      Follow Up Recommendations Follow surgeon's recommendation for DC plan and follow-up therapies    Equipment Recommendations  None recommended by PT    Recommendations for Other Services       Precautions / Restrictions Precautions Precautions: Fall;Knee Required Braces or Orthoses: Knee Immobilizer - Left Knee Immobilizer - Left: Discontinue once straight leg raise with < 10 degree lag Restrictions Weight Bearing Restrictions: No      Mobility  Bed Mobility Overal bed mobility: Needs Assistance Bed Mobility: Supine to Sit     Supine to sit: Min assist     General bed mobility comments: assist with LLE, incr time and cues to self assist  Transfers Overall transfer level: Needs assistance Equipment used: Rolling walker (2 wheeled) Transfers: Sit to/from Stand Sit to Stand: Min assist;Mod assist         General transfer comment: assist to rise and stabilize, uses momentum to stand  Ambulation/Gait Ambulation/Gait assistance: Min guard Gait Distance (Feet): 25 Feet Assistive device: Rolling walker (2 wheeled) Gait Pattern/deviations: Step-to pattern     General Gait Details: cues for sequence, posture  Stairs            Wheelchair Mobility    Modified Rankin (Stroke Patients Only)       Balance                                             Pertinent Vitals/Pain Pain  Assessment: No/denies pain    Home Living Family/patient expects to be discharged to:: Private residence Living Arrangements: Spouse/significant other Available Help at Discharge: Family;Available 24 hours/day Type of Home: House Home Access: Stairs to enter   [employer] of Steps: 3 Home Layout: Two level;Bed/bath upstairs Home Equipment: Walker - 2 wheels;Crutches;Bedside commode;Shower seat - built in;Hand held shower head;Grab bars - toilet      Prior Function Level of Independence: Independent               Hand Dominance        Extremity/Trunk Assessment   Upper Extremity Assessment Upper Extremity Assessment: Overall WFL for tasks assessed    Lower Extremity Assessment Lower Extremity Assessment: LLE deficits/detail LLE Deficits / Details: ankle grossly WFL, knee and hip grossly 2+/5, limited by post op pain and weakness       Communication   Communication: No difficulties  Cognition Arousal/Alertness: Awake/alert Behavior During Therapy: WFL for tasks assessed/performed Overall Cognitive Status: Within Functional Limits for tasks assessed                                        General Comments      Exercises Total Joint Exercises Ankle Circles/Pumps: AROM;Both;10  reps Quad Sets: AROM;5 reps;Both   Assessment/Plan    PT Assessment Patient needs continued PT services  PT Problem List Decreased strength;Decreased range of motion;Decreased activity tolerance;Decreased mobility;Decreased knowledge of precautions;Pain       PT Treatment Interventions DME instruction;Gait training;Functional mobility training;Stair training;Patient/family education;Therapeutic exercise    PT Goals (Current goals can be found in the Care Plan section)  Acute Rehab PT Goals Patient Stated Goal: home soon PT Goal Formulation: With patient/family Time For Goal Achievement: 03/05/18 Potential to Achieve Goals: Good    Frequency 7X/week    Barriers to discharge        Co-evaluation               AM-PAC PT "6 Clicks" Mobility  Outcome Measure Help needed turning from your back to your side while in a flat bed without using bedrails?: A Little Help needed moving from lying on your back to sitting on the side of a flat bed without using bedrails?: A Little Help needed moving to and from a bed to a chair (including a wheelchair)?: A Little Help needed standing up from a chair using your arms (e.g., wheelchair or bedside chair)?: A Little Help needed to walk in hospital room?: A Little Help needed climbing 3-5 steps with a railing? : A Lot 6 Click Score: 17    End of Session Equipment Utilized During Treatment: Gait belt;Left knee immobilizer Activity Tolerance: Patient tolerated treatment well Patient left: in chair;with call bell/phone within reach;with family/visitor present   PT Visit Diagnosis: Difficulty in walking, not elsewhere classified (R26.2)    Time: 7124-5809 PT Time Calculation (min) (ACUTE ONLY): 30 min   Charges:   PT Evaluation $PT Eval Low Complexity: 1 Low PT Treatments $Gait Training: 8-22 mins        Kenyon Ana, PT  Pager: [phone] Acute Rehab Dept [facility]): [phone]   02/26/2018   [facility] 02/26/2018, 3:48 PM

## 2018-02-26 NOTE — Op Note (Signed)
NAME: [name], [name] MEDICAL RECORD [id number] ACCOUNT [id number] DATE OF BIRTH:07/21/1945 FACILITY: [facility] LOCATION: [facility] PHYSICIAN:Khristin Keleher Kerry Fort, MD  OPERATIVE REPORT  DATE OF PROCEDURE:  02/26/2018  PREOPERATIVE DIAGNOSIS:  Primary osteoarthritis and degenerative joint disease, left knee.  POSTOPERATIVE DIAGNOSIS:  Primary osteoarthritis and degenerative joint disease, left knee.  PROCEDURE:  Left total knee arthroplasty.  IMPLANTS:  Stryker Triathlon cemented knee system with size 3 femur, size 3 tibial tray, 11 millimeter fixed bearing polyethylene insert, size 28 symmetric patellar button.  SURGEON:  Lind Guest. Ninfa Linden, MD  ASSISTANT:  Erskine Emery, PA-C  ANESTHESIA: 1.  Left lower extremity adductor canal block. 2.  Spinal.  TOURNIQUET TIME:  Under 1 hour.  ANTIBIOTICS:  Two grams IV Ancef.  ESTIMATED BLOOD LOSS:  100 mL.  COMPLICATIONS:  None.  INDICATIONS:  The patient is a very active and pleasant 73 year old female with debilitating arthritis involving her left knee.  She has tried and failed all forms of conservative treatment.  Her pain is daily and it is 10/10.  Her left knee pain is  detrimentally affected her activities of daily living, her quality of life and her mobility.  It is at this point, she does wish to proceed with total knee arthroplasty.  She has actually had this done on her right knee bilaterally and she has bilateral  shoulder replacements.  She understands fully with surgery, there is risk of acute blood loss anemia, nerve or vessel injury, fracture, infection, DVT, and implant failure.  She understands her goals are to decrease pain, improve mobility and overall  improve quality of life.  DESCRIPTION OF PROCEDURE:  After informed consent was obtained and appropriate left knee was marked.  An adductor canal block was obtained in the holding room of her left lower extremity.  She was brought to the operating room and sat up on  the operating  table.  Spinal anesthesia was obtained.  She was then laid in the supine position on the operating table.  Foley catheter was placed and a nonsterile tourniquet was placed around the upper left thigh.  Her left thigh, knee, leg, ankle and foot were  prepped and draped with DuraPrep and sterile drapes including a sterile stockinette.  A time-out was called to identify correct patient, correct left knee.  We then used an Esmarch to wrap that leg and tourniquet was inflated to 300 mm of pressure.  I  then made a direct midline incision over the patella and carried this proximally and distally.  I dissected down the knee joint carried out a medial parapatellar arthrotomy finding a significantly large joint effusion and significant synovitis throughout  the knee.  We did remove some synovium from around the knee and then with the knee in a flexed position, we removed remnants of ACL, PCL, medial and lateral meniscus as well as periarticular osteophytes from around the knee.  We then used the  extramedullary cutting guide for making our tibia cut first.  We set this for a neutral slope correcting for varus and valgus and taking 9 mm off the high side.  We made this cut without difficulty.  We then used an intramedullary reamer to open up the  femoral canal through the intercondylar notch using intramedullary cutting guide for distal femoral cut setting this for an 8 mm distal femoral cut for a left knee at 5 degrees externally rotated.  We made this cut without difficulty and brought the knee  back down to full extension, removed more  remnants of the meniscus from the back of the knee and placed a 9 mm extension block and we achieved full extension.  We then went back to the femur and put our femoral sizing guide based off the epicondylar  axis for a left knee at 30 degrees and chose a size 3 femur based off of this.  We put our 4-in-1 cutting block for size 3 femur, made our anterior and posterior  cuts followed by our chamfer cuts.  We then made our femoral box cut based off a size 3  femur.  We then went back to the tibia and chose a size 3 tibia for coverage of the tibial plateau with setting the rotation based off the tibial tubercle and the femur.  We made our keel punch off of this.  With the size 3 trial tibia followed by the  trial 3 femur, we tried a 9 mm and then an 11 mm polyethylene insert.  We were pleased with the 11 mm stability and range of motion.  We then made our patellar cut and drilled 3 holes for a central size 28 patellar button.  With all trial instrumentation  in place, we were pleased with stability and range of motion of the knee.  We then removed all trial instrumentation from the knee and irrigated the knee with normal saline solution using pulsatile lavage.  We then mixed our cement and cemented the real  Stryker Triathlon tibial tray size 3 followed by the size 3 left femur.  We placed our 11 millimeter fixed bearing polyethylene insert and cemented our patellar button.  With the knee extended fully extended.  We held this in place until the cement  hardened.  We then removed cement debris from the knee.  We let the tourniquet down.  Hemostasis obtained with electrocautery.  We then closed the arthrotomy with interrupted #1 Vicryl suture followed by 0 Vicryl in the deep tissue, 2-0 Vicryl  subcutaneous tissue, 4-0 Monocryl subcuticular stitch and Steri-Strips on the skin.  A well-padded sterile dressing was applied.  She was taken to recovery room in stable condition.  All final counts were correct.  There were no complications noted.  Of  note, Benita Stabile, PA-C, assisted the entire case.  His assistance was crucial for facilitating all aspects of this case.  TN/NUANCE  D:02/26/2018 T:02/26/2018 JOB:[id number]

## 2018-02-26 NOTE — Brief Op Note (Signed)
02/26/2018  8:44 AM  PATIENT:  Sundra Aland  73 y.o. female  PRE-OPERATIVE DIAGNOSIS:  osteoarthritis left knee  POST-OPERATIVE DIAGNOSIS:  osteoarthritis left knee  PROCEDURE:  Procedure(s): LEFT TOTAL KNEE ARTHROPLASTY (Left)  SURGEON:  Surgeon(s) and Role:    Mcarthur Rossetti, MD - Primary  PHYSICIAN ASSISTANT: Benita Stabile, PA-C  ANESTHESIA:   regional and spinal  EBL:  50 mL   COUNTS:  YES  TOURNIQUET:   Total Tourniquet Time Documented: Thigh (Left) - 44 minutes Total: Thigh (Left) - 44 minutes   DICTATION: .Other Dictation: Dictation Number [id number]  PLAN OF CARE: Admit to inpatient   PATIENT DISPOSITION:  PACU - hemodynamically stable.   Delay start of Pharmacological VTE agent (>24hrs) due to surgical blood loss or risk of bleeding: no

## 2018-02-26 NOTE — Transfer of Care (Signed)
Immediate Anesthesia Transfer of Care Note  Patient: [name]  Procedure(s) Performed: LEFT TOTAL KNEE ARTHROPLASTY (Left Knee)  Patient Location: PACU  Anesthesia Type:MAC and Spinal  Level of Consciousness: awake, alert  and oriented  Airway & Oxygen Therapy: Patient Spontanous Breathing and Patient connected to face mask oxygen  Post-op Assessment: Report given to RN and Post -op Vital signs reviewed and stable  Post vital signs: Reviewed and stable  Last Vitals:  Vitals Value Taken Time  BP 152/59 02/26/2018  9:15 AM  Temp    Pulse 79 02/26/2018  9:15 AM  Resp 14 02/26/2018  9:15 AM  SpO2 100 % 02/26/2018  9:15 AM  Vitals shown include unvalidated device data.  Last Pain: There were no vitals filed for this visit.       Complications: No apparent anesthesia complications

## 2018-02-26 NOTE — Anesthesia Procedure Notes (Signed)
Spinal  Start time: 02/26/2018 7:24 AM End time: 02/26/2018 7:26 AM Staffing Anesthesiologist: Effie Berkshire, MD Performed: anesthesiologist  Preanesthetic Checklist Completed: patient identified, site marked, surgical consent, pre-op evaluation, timeout performed, IV checked, risks and benefits discussed and monitors and equipment checked Spinal Block Patient position: sitting Prep: site prepped and draped and DuraPrep Location: L3-4 Injection technique: single-shot Needle Needle type: Pencan  Needle gauge: 24 G Needle length: 10 cm Needle insertion depth: 10 cm Additional Notes Patient tolerated well. No immediate complications.

## 2018-02-26 NOTE — Anesthesia Postprocedure Evaluation (Signed)
Anesthesia Post Note  Patient: [name]  Procedure(s) Performed: LEFT TOTAL KNEE ARTHROPLASTY (Left Knee)     Patient location during evaluation: PACU Anesthesia Type: Spinal Level of consciousness: oriented and awake and alert Pain management: pain level controlled Vital Signs Assessment: post-procedure vital signs reviewed and stable Respiratory status: spontaneous breathing, respiratory function stable and patient connected to nasal cannula oxygen Cardiovascular status: blood pressure returned to baseline and stable Postop Assessment: no headache, no backache and no apparent nausea or vomiting Anesthetic complications: no    Last Vitals:  Vitals:   02/26/18 1148 02/26/18 1235  BP: 133/65 (!) 142/68  Pulse: 75 79  Resp: 16 16  Temp: 36.8 C   SpO2: 98% 99%    Last Pain:  Vitals:   02/26/18 1205  TempSrc:   PainSc: [place]

## 2018-02-26 NOTE — H&P (Signed)
TOTAL KNEE ADMISSION H&P  Patient is being admitted for left total knee arthroplasty.  Subjective:  Chief Complaint:left knee pain.  HPI: [name], 73 y.o. female, has a history of pain and functional disability in the left knee due to arthritis and has failed non-surgical conservative treatments for greater than 12 weeks to includeNSAID's and/or analgesics, corticosteriod injections, viscosupplementation injections, flexibility and strengthening excercises, weight reduction as appropriate and activity modification.  Onset of symptoms was gradual, starting 3 years ago with gradually worsening course since that time. The patient noted no past surgery on the left knee(s).  Patient currently rates pain in the left knee(s) at 10 out of 10 with activity. Patient has night pain, worsening of pain with activity and weight bearing, pain that interferes with activities of daily living, pain with passive range of motion, crepitus and joint swelling.  Patient has evidence of subchondral sclerosis, periarticular osteophytes and joint space narrowing by imaging studies. There is no active infection.  Patient Active Problem List   Diagnosis Date Noted  . Unilateral primary osteoarthritis, left knee 02/01/2018  . 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 SI (sacroiliac) pain 12/27/2014  . Postop Hyponatremia 09/10/2011  . OA (osteoarthritis) of hip 09/08/2011   Past Medical History:  Diagnosis Date  . Allergic rhinitis   . Anxiety   . Chronic venous insufficiency   . Costochondritis 04/13/2017  . DDD (degenerative disc disease), cervical   . DDD (degenerative disc disease), lumbar   . History of colon polyps   . Hyperlipidemia   . Hypertension   . Hypothyroidism   . Insomnia   . Liver cyst   . Liver nodule   . OA (osteoarthritis)    Hip and knee  . Obese   . Pneumonia    hx  . Restless legs syndrome (RLS)   . Rotator cuff tear arthropathy, right   . Squamous cell carcinoma  of left thigh   . Urge incontinence of urine     Past Surgical History:  Procedure Laterality Date  . APPENDECTOMY    . BLADDER SUSPENSION  2011  . COLONOSCOPY    . JOINT REPLACEMENT    . 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 REVERSE SHOULDER ARTHROPLASTY Right 05/14/2017   Procedure: RIGHT REVERSE SHOULDER ARTHROPLASTY;  Surgeon: Justice Britain, MD;  Location: [place];  Service: Orthopedics;  Laterality: Right;  . REVERSE SHOULDER ARTHROPLASTY Left 07/30/2017   Procedure: REVERSE SHOULDER ARTHROPLASTY;  Surgeon: Justice Britain, MD;  Location: [place];  Service: Orthopedics;  Laterality: Left;  . right knee replacment  08  . SACROILIAC JOINT FUSION Right 12/27/2014   Procedure: RIGHT SACROILIAC JOINT FUSION;  Surgeon: Melina Schools, MD;  Location: [place];  Service: Orthopedics;  Laterality: Right;  right sacral fusion  . TONSILLECTOMY    . TOTAL HIP ARTHROPLASTY  09/08/2011   Procedure: TOTAL HIP ARTHROPLASTY;  Surgeon: Gearlean Alf, MD;  Location: [facility];  Service: Orthopedics;  Laterality: Right;  . WISDOM TOOTH EXTRACTION      Current Facility-Administered Medications  Medication Dose Route Frequency Provider Last Rate Last Dose  . ceFAZolin (ANCEF) IVPB 2g/100 mL premix  2 g Intravenous On Call to OR Pete Pelt, PA-C      . chlorhexidine (HIBICLENS) 4 % liquid 4 application  60 mL Topical Once Erskine Emery W, PA-C      . lactated ringers infusion  Intravenous Continuous Effie Berkshire, MD 50 mL/hr at 02/26/18 0601    . tranexamic acid (CYKLOKAPRON) IVPB 1,000 mg  1,000 mg Intravenous To OR Pete Pelt, PA-C       Allergies  Allergen Reactions  . Atorvastatin Other (See Comments)    ALOPECIA [08 Mar 2013]  . Tramadol Itching  . Oxycodone Itching and Other (See Comments)    Can take with antihistamines     Social History   Tobacco Use  . Smoking status: Never Smoker  . Smokeless tobacco: Never Used  Substance  Use Topics  . Alcohol use: No    Family History  Problem Relation Age of Onset  . Heart disease Father      Review of Systems  Musculoskeletal: Positive for joint pain.  All other systems reviewed and are negative.   Objective:  Physical Exam  Constitutional: She is oriented to person, place, and time. She appears well-developed and well-nourished.  HENT:  Head: Normocephalic and atraumatic.  Eyes: Pupils are equal, round, and reactive to light. EOM are normal.  Neck: Normal range of motion. Neck supple.  Cardiovascular: Normal rate.  Respiratory: Effort normal and breath sounds normal.  GI: Soft. Bowel sounds are normal.  Musculoskeletal:     Left knee: She exhibits decreased range of motion, swelling, effusion, abnormal alignment, bony tenderness and abnormal meniscus. Tenderness found. Medial joint line and lateral joint line tenderness noted.  Neurological: She is alert and oriented to person, place, and time.  Skin: Skin is warm and dry.  Psychiatric: She has a normal mood and affect.    Vital signs in last 24 hours: Pulse Rate:  [77] 77 (01/10 0610) Resp:  [16] 16 (01/10 0610) BP: (139)/(82) 139/82 (01/10 0610) SpO2:  [100 %] 100 % (01/10 0610)  Labs:   Estimated body mass index is 33.3 kg/m as calculated from the following:   Height as of 02/22/18: 5' 3.5" (1.613 m).   Weight as of 02/22/18: 86.6 kg.   Imaging Review Plain radiographs demonstrate severe degenerative joint disease of the left knee(s). The overall alignment ismild varus. The bone quality appears to be good for age and reported activity level.   Preoperative templating of the joint replacement has been completed, documented, and submitted to the Operating Room personnel in order to optimize intra-operative equipment management.    Patient's anticipated LOS is less than 2 midnights, meeting these requirements: - Younger than 3 - Lives within 1 hour of care - Has a competent adult at home to  recover with post-op recover - NO history of  - Chronic pain requiring opiods  - Diabetes  - Coronary Artery Disease  - Heart failure  - Heart attack  - Stroke  - DVT/VTE  - Cardiac arrhythmia  - Respiratory Failure/COPD  - Renal failure  - Anemia  - Advanced Liver disease        Assessment/Plan:  End stage arthritis, left knee   The patient history, physical examination, clinical judgment of the provider and imaging studies are consistent with end stage degenerative joint disease of the left knee(s) and total knee arthroplasty is deemed medically necessary. The treatment options including medical management, injection therapy arthroscopy and arthroplasty were discussed at length. The risks and benefits of total knee arthroplasty were presented and reviewed. The risks due to aseptic loosening, infection, stiffness, patella tracking problems, thromboembolic complications and other imponderables were discussed. The patient acknowledged the explanation, agreed to proceed with the plan and consent was signed.  Patient is being admitted for inpatient treatment for surgery, pain control, PT, OT, prophylactic antibiotics, VTE prophylaxis, progressive ambulation and ADL's and discharge planning. The patient is planning to be discharged home with home health services

## 2018-02-26 NOTE — Anesthesia Procedure Notes (Signed)
Date/Time: 02/26/2018 7:50 AM Performed by: Glory Buff, CRNA Oxygen Delivery Method: Simple face mask

## 2018-02-26 NOTE — Anesthesia Procedure Notes (Signed)
Date/Time: 02/26/2018 7:09 AM Performed by: Glory Buff, CRNA Oxygen Delivery Method: Nasal cannula

## 2018-02-26 NOTE — Anesthesia Procedure Notes (Signed)
Anesthesia Regional Block: Adductor canal block   Pre-Anesthetic Checklist: ,, timeout performed, Correct Patient, Correct Site, Correct Laterality, Correct Procedure, Correct Position, site marked, Risks and benefits discussed,  Surgical consent,  Pre-op evaluation,  At surgeon's request and post-op pain management  Laterality: Left  Prep: chloraprep       Needles:  Injection technique: Single-shot  Needle Type: Echogenic Stimulator Needle     Needle Length: 9cm  Needle Gauge: 21     Additional Needles:   Procedures:,,,, ultrasound used (permanent image in chart),,,,  Narrative:  Start time: 02/26/2018 7:08 AM End time: 02/26/2018 7:13 AM Injection made incrementally with aspirations every 5 mL.  Performed by: Personally  Anesthesiologist: Effie Berkshire, MD  Additional Notes: Patient tolerated the procedure well. Local anesthetic introduced in an incremental fashion under minimal resistance after negative aspirations. No paresthesias were elicited. After completion of the procedure, no acute issues were identified and patient continued to be monitored by RN.

## 2018-02-27 DIAGNOSIS — M1712 Unilateral primary osteoarthritis, left knee: Secondary | ICD-10-CM | POA: Diagnosis not present

## 2018-02-27 LAB — CBC
HCT: 34.2 % — ABNORMAL LOW (ref 36.0–46.0)
Hemoglobin: 11.2 g/dL — ABNORMAL LOW (ref 12.0–15.0)
MCH: 29.7 pg (ref 26.0–34.0)
MCHC: 32.7 g/dL (ref 30.0–36.0)
MCV: 90.7 fL (ref 80.0–100.0)
Platelets: 177 10*3/uL (ref 150–400)
RBC: 3.77 MIL/uL — ABNORMAL LOW (ref 3.87–5.11)
RDW: 12.3 % (ref 11.5–15.5)
WBC: 9.1 10*3/uL (ref 4.0–10.5)
nRBC: 0 % (ref 0.0–0.2)

## 2018-02-27 LAB — BASIC METABOLIC PANEL
Anion gap: 9 (ref 5–15)
BUN: 22 mg/dL (ref 8–23)
CO2: 24 mmol/L (ref 22–32)
Calcium: 8.3 mg/dL — ABNORMAL LOW (ref 8.9–10.3)
Chloride: 106 mmol/L (ref 98–111)
Creatinine, Ser: 0.59 mg/dL (ref 0.44–1.00)
GFR calc Af Amer: >60 mL/min (ref >60)
GFR calc non Af Amer: >60 mL/min (ref >60)
GLUCOSE: 132 mg/dL — AB (ref 70–99)
Potassium: 4 mmol/L (ref 3.5–5.1)
Sodium: 139 mmol/L (ref 135–145)

## 2018-02-27 MED ORDER — HYDROCODONE-ACETAMINOPHEN 5-325 MG PO TABS: 1.0000 | ORAL_TABLET | ORAL | 0 refills | Status: DC | PRN

## 2018-02-27 MED ORDER — METHOCARBAMOL 500 MG PO TABS: 500.0000 mg | ORAL_TABLET | Freq: Four times a day (QID) | ORAL | 0 refills | Status: DC | PRN

## 2018-02-27 MED ORDER — ASPIRIN 325 MG PO TBEC: 325.0000 mg | DELAYED_RELEASE_TABLET | Freq: Two times a day (BID) | ORAL | 0 refills | Status: AC

## 2018-02-27 NOTE — Progress Notes (Signed)
Physical Therapy Treatment Patient Details Name: [name] MRN: [id number] DOB: 06-08-1945 Today's Date: 02/27/2018    History of Present Illness 73 yo female s/p L TKA; Hx: R TSA 04/2017, L TSA  07/2017    PT Comments    Pt progressing very well; reviewed stairs, gait, HEP; pt feels ready for d/c today. OK to D/C from PT standpoint.   Follow Up Recommendations  Follow surgeon's recommendation for DC plan and follow-up therapies     Equipment Recommendations  None recommended by PT    Recommendations for Other Services       Precautions / Restrictions Precautions Precautions: Fall;Knee Precaution Comments: able to do IND SLRs, discussed useof KI for flight of stairs and at night if side sleeping Required Braces or Orthoses: Knee Immobilizer - Left Knee Immobilizer - Left: Discontinue once straight leg raise with < 10 degree lag Restrictions Weight Bearing Restrictions: No    Mobility  Bed Mobility               General bed mobility comments: in chair  Transfers Overall transfer level: Needs assistance Equipment used: Rolling walker (2 wheeled) Transfers: Sit to/from Stand Sit to Stand: Supervision         General transfer comment: cues for hand placement, no physical assist needed  Ambulation/Gait Ambulation/Gait assistance: Supervision Gait Distance (Feet): 200 Feet Assistive device: Rolling walker (2 wheeled) Gait Pattern/deviations: Step-to pattern;Decreased weight shift to left     General Gait Details: cues for sequence, posture   Stairs Stairs: Yes Stairs assistance: Min guard;Min assist Stair Management: No rails;Backwards;With walker;Step to pattern Number of Stairs: 3 General stair comments: cues for sequence, husband present for session and able to return demo assist level    Wheelchair Mobility    Modified Rankin (Stroke Patients Only)       Balance                                            Cognition  Arousal/Alertness: Awake/alert Behavior During Therapy: WFL for tasks assessed/performed Overall Cognitive Status: Within Functional Limits for tasks assessed                                        Exercises Total Joint Exercises Ankle Circles/Pumps: AROM;Both;10 reps Quad Sets: AROM;Both;10 reps Short Arc Quad: AROM;Left;5 reps Heel Slides: AAROM;Left;10 reps Hip ABduction/ADduction: AROM;Left;10 reps Straight Leg Raises: AROM;Left;10 reps Knee Flexion: AAROM;Left;5 reps;Seated Goniometric ROM: grossly 10* to 65* left knee flexion in sitting    General Comments        Pertinent Vitals/Pain Pain Assessment: 0-10 Pain Score: 4  Pain Location: L knee Pain Descriptors / Indicators: Sore Pain Intervention(s): Limited activity within patient's tolerance;Monitored during session;Premedicated before session;Ice applied;RN gave pain meds during session    [place] expects to be discharged to:: Private residence Living Arrangements: Spouse/significant other Available Help at Discharge: Family;Available 24 hours/day         Home Equipment: Shower seat;Hand held shower head(safety frame around toilet)      Prior Function Level of Independence: Independent          PT Goals (current goals can now be found in the care plan section) Acute Rehab PT Goals Patient Stated Goal: home today PT Goal Formulation: With patient/family  Time For Goal Achievement: 03/05/18 Potential to Achieve Goals: Good Progress towards PT goals: Progressing toward goals    Frequency    7X/week      PT Plan Current plan remains appropriate    Co-evaluation              AM-PAC PT "6 Clicks" Mobility   Outcome Measure  Help needed turning from your back to your side while in a flat bed without using bedrails?: A Little Help needed moving from lying on your back to sitting on the side of a flat bed without using bedrails?: A Little Help needed moving to  and from a bed to a chair (including a wheelchair)?: A Little Help needed standing up from a chair using your arms (e.g., wheelchair or bedside chair)?: None Help needed to walk in hospital room?: A Little Help needed climbing 3-5 steps with a railing? : A Little 6 Click Score: 19    End of Session Equipment Utilized During Treatment: Gait belt;Left knee immobilizer(KI for amb stairs, IND SLRs with HEP, KI not needed) Activity Tolerance: Patient tolerated treatment well Patient left: in chair;with call bell/phone within reach;with family/visitor present   PT Visit Diagnosis: Difficulty in walking, not elsewhere classified (R26.2)     Time: 1700-1749 PT Time Calculation (min) (ACUTE ONLY): 38 min  Charges:  $Gait Training: 23-37 mins $Therapeutic Exercise: 8-22 mins                     Kenyon Ana, PT  Pager: [phone] Acute Rehab Dept [facility]): [phone]   02/27/2018    [facility] 02/27/2018, 10:29 AM

## 2018-02-27 NOTE — Progress Notes (Signed)
Subjective: 1 Day Post-Op Procedure(s) (LRB): LEFT TOTAL KNEE ARTHROPLASTY (Left) Patient reports pain as moderate.    Objective: Vital signs in last 24 hours: Temp:  [98.4 F (36.9 C)-98.6 F (37 C)] 98.4 F (36.9 C) (01/11 0605) Pulse Rate:  [78-83] 78 (01/11 1026) Resp:  [15-18] 16 (01/11 1026) BP: (128-159)/(53-68) 139/53 (01/11 1026) SpO2:  [92 %-100 %] 100 % (01/11 1026)  Intake/Output from previous day: 01/10 0701 - 01/11 0700 In: 3338.6 [P.O.:760; I.V.:2328.6; IV Piggyback:250] Out: 2025 [Urine:1975; Blood:50] Intake/Output this shift: Total I/O In: -  Out: 150 [Urine:150]  Recent Labs    02/27/18 0436  HGB 11.2*   Recent Labs    02/27/18 0436  WBC 9.1  RBC 3.77*  HCT 34.2*  PLT 177   Recent Labs    02/27/18 0436  NA 139  K 4.0  CL 106  CO2 24  BUN 22  CREATININE 0.59  GLUCOSE 132*  CALCIUM 8.3*   No results for input(s): LABPT, INR in the last 72 hours.  Sensation intact distally Intact pulses distally Dorsiflexion/Plantar flexion intact Incision: dressing C/D/I No cellulitis present Compartment soft   Patient's anticipated LOS is less than 2 midnights, meeting these requirements: - Younger than 49 - Lives within 1 hour of care - Has a competent adult at home to recover with post-op recover - NO history of  - Chronic pain requiring opiods  - Diabetes  - Coronary Artery Disease  - Heart failure  - Heart attack  - Stroke  - DVT/VTE  - Cardiac arrhythmia  - Respiratory Failure/COPD  - Renal failure  - Anemia  - Advanced Liver disease      Assessment/Plan: 1 Day Post-Op Procedure(s) (LRB): LEFT TOTAL KNEE ARTHROPLASTY (Left) Up with therapy Discharge home with home health this afternoon.    Mcarthur Rossetti 02/27/2018, 2:07 PM

## 2018-02-27 NOTE — Progress Notes (Signed)
Pt provided and explained dc instructions in detail; Pt verbalizes understanding.Escorted to lobby in wheel chair to be discharged home with spouse.

## 2018-02-27 NOTE — Discharge Summary (Signed)
Patient ID: [name] MRN: [id number] DOB/AGE: 1945-03-18 73 y.o.  Admit date: 02/26/2018 Discharge date: 02/27/2018  Admission Diagnoses:  Principal Problem:   Unilateral primary osteoarthritis, left knee Active Problems:   Status post total left knee replacement   Discharge Diagnoses:  Same  Past Medical History:  Diagnosis Date  . Allergic rhinitis   . Anxiety   . Chronic venous insufficiency   . Costochondritis 04/13/2017  . DDD (degenerative disc disease), cervical   . DDD (degenerative disc disease), lumbar   . History of colon polyps   . Hyperlipidemia   . Hypertension   . Hypothyroidism   . Insomnia   . Liver cyst   . Liver nodule   . OA (osteoarthritis)    Hip and knee  . Obese   . Pneumonia    hx  . Restless legs syndrome (RLS)   . Rotator cuff tear arthropathy, right   . Squamous cell carcinoma of left thigh   . Urge incontinence of urine     Surgeries: Procedure(s): LEFT TOTAL KNEE ARTHROPLASTY on 02/26/2018   Consultants:   Discharged Condition: Improved  Hospital Course: [name] is an 73 y.o. female who was admitted 02/26/2018 for operative treatment ofUnilateral primary osteoarthritis, left knee. Patient has severe unremitting pain that affects sleep, daily activities, and work/hobbies. After pre-op clearance the patient was taken to the operating room on 02/26/2018 and underwent  Procedure(s): LEFT TOTAL KNEE ARTHROPLASTY.    Patient was given perioperative antibiotics:  Anti-infectives (From admission, onward)   Start     Dose/Rate Route Frequency Ordered Stop   02/26/18 1400  ceFAZolin (ANCEF) IVPB 1 g/50 mL premix     1 g 100 mL/hr over 30 Minutes Intravenous Every 6 hours 02/26/18 1049 02/26/18 2043   02/26/18 0600  ceFAZolin (ANCEF) IVPB 2g/100 mL premix     2 g 200 mL/hr over 30 Minutes Intravenous On call to O.R. 02/26/18 1027 02/26/18 0758       Patient was given sequential compression devices, early ambulation, and  chemoprophylaxis to prevent DVT.  Patient benefited maximally from hospital stay and there were no complications.    Recent vital signs:  Patient Vitals for the past 24 hrs:  BP Temp Temp src Pulse Resp SpO2  02/27/18 1026 (!) 139/53 - - 78 16 100 %  02/27/18 0605 (!) 134/55 98.4 F (36.9 C) Oral 81 15 100 %  02/27/18 0142 (!) 149/63 98.6 F (37 C) Oral 83 17 100 %  02/26/18 1957 (!) 159/68 98.5 F (36.9 C) Oral 81 18 92 %  02/26/18 1723 128/67 - - 82 16 93 %     Recent laboratory studies:  Recent Labs    02/27/18 0436  WBC 9.1  HGB 11.2*  HCT 34.2*  PLT 177  NA 139  K 4.0  CL 106  CO2 24  BUN 22  CREATININE 0.59  GLUCOSE 132*  CALCIUM 8.3*     Discharge Medications:   Allergies as of 02/27/2018      Reactions   Atorvastatin Other (See Comments)   ALOPECIA [08 Mar 2013]   Tramadol Itching   Oxycodone Itching, Other (See Comments)   Can take with antihistamines       Medication List    STOP taking these medications   aspirin 81 MG tablet Replaced by:  aspirin 325 MG EC tablet     TAKE these medications   ASHWAGANDHA PO Take 1 capsule by mouth 2 (two) times daily.  aspirin 325 MG EC tablet Take 1 tablet (325 mg total) by mouth 2 (two) times daily after a meal. Replaces:  aspirin 81 MG tablet   CALTRATE 600+D 600-400 MG-UNIT tablet Generic drug:  Calcium Carbonate-Vitamin D Take 2 tablets by mouth daily.   cetirizine 10 MG tablet Commonly known as:  ZYRTEC Take 10 mg by mouth daily.   Co Q 10 100 MG Caps Take 100 mg by mouth every morning.   [employer] LUBRICATING/DRY EYE OP Place 1 drop into both eyes 3 (three) times daily as needed (for dry eyes).   gabapentin 300 MG capsule Commonly known as:  NEURONTIN Take 1,500 mg by mouth at bedtime.   guaiFENesin 600 MG 12 hr tablet Commonly known as:  MUCINEX Take 600 mg by mouth daily as needed for cough or to loosen phlegm.   HYDROcodone-acetaminophen 5-325 MG tablet Commonly known as:   NORCO/VICODIN Take 1-2 tablets by mouth every 4 (four) hours as needed for moderate pain or severe pain.   Krill Oil 1000 MG Caps Take 1,000 mg by mouth 2 (two) times daily.   levothyroxine 88 MCG tablet Commonly known as:  SYNTHROID, LEVOTHROID Take 88 mcg by mouth daily before breakfast.   liothyronine 5 MCG tablet Commonly known as:  CYTOMEL Take 5 mcg by mouth daily.   lisinopril-hydrochlorothiazide 20-25 MG tablet Commonly known as:  PRINZIDE,ZESTORETIC Take 1 tablet by mouth daily with breakfast.   methocarbamol 500 MG tablet Commonly known as:  ROBAXIN Take 1 tablet (500 mg total) by mouth every 6 (six) hours as needed for muscle spasms.   MILK THISTLE PO Take 1 capsule by mouth daily.   multivitamin with minerals tablet Take 1 tablet by mouth at bedtime.   NONFORMULARY OR COMPOUNDED ITEM Apply 1 application topically 2 (two) times a week. Estradiol 0.1% Cream - Fingertip amount twice weekly on Tuesday and Friday   PROBIOTIC PO Take 1 capsule by mouth daily.   rosuvastatin 10 MG tablet Commonly known as:  CRESTOR Take 10 mg by mouth at bedtime.   temazepam 30 MG capsule Commonly known as:  RESTORIL Take 30 mg by mouth at bedtime.   Vitamin D3 125 MCG (5000 UT) Tabs Take 5,000 Units by mouth at bedtime.            Durable Medical Equipment  (From admission, onward)         Start     Ordered   02/26/18 0917  DME Walker rolling  Once    Question:  Patient needs a walker to treat with the following condition  Answer:  Status post total left knee replacement   02/26/18 0916   02/26/18 0917  DME 3 n 1  Once     02/26/18 9323          Diagnostic Studies: Dg Knee Left Port  Result Date: 02/26/2018 CLINICAL DATA:  Status post left knee replacement EXAM: PORTABLE LEFT KNEE - 1-2 VIEW COMPARISON:  None. FINDINGS: Left knee prosthesis is seen in satisfactory position. No acute bony or soft tissue abnormality is noted. IMPRESSION: Status post left knee  replacement. Electronically Signed   By: Inez Catalina M.D.   On: 02/26/2018 14:54    Disposition: Discharge disposition: 01-Home or [place], [facility] up.   Specialty:  [place] Why:  [place] will call to arrange initial visit Contact information: [place]  [address] [place] [address] [place] [address] [phone]        Mcarthur Rossetti, MD Follow up in 2 week(s).   Specialty:  Orthopedic Surgery Contact information: [place] [address] [phone]            Signed: Mcarthur Rossetti 02/27/2018, 2:11 PM

## 2018-02-27 NOTE — Care Management Note (Signed)
Case Management Note  Patient Details  Name: CORALYN ROSELLI MRN: [id number] Date of Birth: May 11, 1945  Subjective/Objective:    S/p L TKA                Action/Plan: NCM spoke to pt, husband and dtr at bedside. Pt offered choice for HH/CMS list provided/placed on chart. Pt agreeable to [facility] for [place]. Pt has RW at home. Requested 3n1 bedside commode. Contacted [facility] for 3n1 to be delivered to room prior to dc.   Expected Discharge Date:  02/27/18               Expected Discharge Plan:  [place]  In-House Referral:  NA  Discharge planning Services  CM Consult  Post Acute Care Choice:  Home Health Choice offered to:  Patient  DME Arranged:  3-N-1 DME Agency:  [place]:  PT [place] Agency:  [facility])  Status of Service:  Completed, signed off  If discussed at [employer] of Stay Meetings, dates discussed:    Additional Comments:  Erenest Rasher, RN 02/27/2018, 2:55 PM

## 2018-02-27 NOTE — Evaluation (Signed)
Occupational Therapy Evaluation Patient Details Name: [name] MRN: [id number] DOB: Aug 08, 1945 Today's Date: 02/27/2018    History of Present Illness 73 yo female s/p L TKA; Hx: R TSA 04/2017, L TSA  07/2017   Clinical Impression   Pt was admitted for the above sx. All education was completed. No further OT is needed at this time     Follow Up Recommendations  Supervision/Assistance - 24 hour    Equipment Recommendations  3 in 1 bedside commode    Recommendations for Other Services       Precautions / Restrictions Precautions Precautions: Fall;Knee Required Braces or Orthoses: Knee Immobilizer - Left Knee Immobilizer - Left: Discontinue once straight leg raise with < 10 degree lag Restrictions Weight Bearing Restrictions: No      Mobility Bed Mobility               General bed mobility comments: oob  Transfers   Equipment used: Rolling walker (2 wheeled)   Sit to Stand: Min assist         General transfer comment: assist to rise and steady.  Cues for UE/LE placement    Balance                                           ADL either performed or assessed with clinical judgement   ADL Overall ADL's : Needs assistance/impaired Eating/Feeding: Independent   Grooming: Oral care;Supervision/safety;Standing   Upper Body Bathing: Set up;Sitting   Lower Body Bathing: Moderate assistance;Sit to/from stand   Upper Body Dressing : Minimal assistance;Sitting(lines)   Lower Body Dressing: Maximal assistance;Sit to/from stand   Toilet Transfer: Minimal assistance;Comfort height toilet;Grab bars   Toileting- Clothing Manipulation and Hygiene: Minimal assistance;Sit to/from stand         General ADL Comments: ambulated to bathroom, performed ADL and toilet transfer.  Pt verbalizes understanding of shower transfer. She did not feel she needed to practice.      Vision         Perception     Praxis      Pertinent Vitals/Pain  Pain Assessment: 0-10 Pain Score: 2  Pain Location: R knee Pain Descriptors / Indicators: Sore Pain Intervention(s): Limited activity within patient's tolerance;Monitored during session;Premedicated before session;Repositioned     Hand Dominance     Extremity/Trunk Assessment Upper Extremity Assessment Upper Extremity Assessment: (h/o bil tsa; can reach to 90 R>L)           Communication Communication Communication: No difficulties   Cognition Arousal/Alertness: Awake/alert Behavior During Therapy: WFL for tasks assessed/performed Overall Cognitive Status: Within Functional Limits for tasks assessed                                     General Comments       Exercises     Shoulder Instructions      Home Living Family/patient expects to be discharged to:: Private residence Living Arrangements: Spouse/significant other Available Help at Discharge: Family;Available 24 hours/day               Bathroom Shower/Tub: Occupational psychologist: Standard     Home Equipment: Shower seat;Hand held shower head(safety frame around toilet)          Prior Functioning/Environment Level of Independence: Independent  OT Problem List:        OT Treatment/Interventions:      OT Goals(Current goals can be found in the care plan section) Acute Rehab OT Goals Patient Stated Goal: home today OT Goal Formulation: All assessment and education complete, DC therapy  OT Frequency:     Barriers to D/C:            Co-evaluation              AM-PAC OT "6 Clicks" Daily Activity     Outcome Measure Help from another person eating meals?: None Help from another person taking care of personal grooming?: A Little Help from another person toileting, which includes using toliet, bedpan, or urinal?: A Little Help from another person bathing (including washing, rinsing, drying)?: A Lot Help from another person to put on and taking  off regular upper body clothing?: A Little Help from another person to put on and taking off regular lower body clothing?: A Lot 6 Click Score: 17   End of Session    Activity Tolerance: Patient tolerated treatment well Patient left: in chair;with call bell/phone within reach;with family/visitor present  OT Visit Diagnosis: Pain Pain - Right/Left: Right Pain - part of body: Knee                Time: 2158-7276 OT Time Calculation (min): 31 min Charges:  OT General Charges $OT Visit: 1 Visit OT Evaluation $OT Eval Low Complexity: 1 Low OT Treatments $Self Care/Home Management : 8-22 mins  Lesle Chris, OTR/L Acute Rehabilitation Services [phone] [facility] pager [phone] office 02/27/2018  [place] 02/27/2018, 9:34 AM

## 2018-02-27 NOTE — Discharge Summary (Signed)

## 2018-03-02 ENCOUNTER — Telehealth (INDEPENDENT_AMBULATORY_CARE_PROVIDER_SITE_OTHER): Payer: Self-pay | Admitting: Orthopaedic Surgery

## 2018-03-02 ENCOUNTER — Encounter (HOSPITAL_COMMUNITY): Payer: Self-pay | Admitting: Orthopaedic Surgery

## 2018-03-02 NOTE — Telephone Encounter (Signed)
Verbal order given  

## 2018-03-02 NOTE — Telephone Encounter (Signed)
[name] at [place]  [phone]     Verbal orders  4 times a wk for I wk   2 times for 1 wk

## 2018-03-05 ENCOUNTER — Telehealth (INDEPENDENT_AMBULATORY_CARE_PROVIDER_SITE_OTHER): Payer: Self-pay

## 2018-03-05 NOTE — Telephone Encounter (Signed)
Pt is 1 week s/p a total knee. Has not had a BM in 6 days. States the pt has tried OTC stool softness no help. Advised while the pt in on pain medication she needs to take it as the package instructs daily but should also try a one time mag citrate per Dr. Sharol Given one bottle and if no result then to call PCP.

## 2018-03-08 ENCOUNTER — Telehealth (INDEPENDENT_AMBULATORY_CARE_PROVIDER_SITE_OTHER): Payer: Self-pay | Admitting: Orthopaedic Surgery

## 2018-03-08 ENCOUNTER — Encounter (INDEPENDENT_AMBULATORY_CARE_PROVIDER_SITE_OTHER): Payer: Self-pay | Admitting: Orthopaedic Surgery

## 2018-03-08 NOTE — Telephone Encounter (Signed)
Patient left a message requesting an RX refill on her Hydrocodone.  She is requesting enough to last her until her next appointment Dr. Ninfa Linden.  CB#[phone].  Thank you.

## 2018-03-09 ENCOUNTER — Other Ambulatory Visit (INDEPENDENT_AMBULATORY_CARE_PROVIDER_SITE_OTHER): Payer: Self-pay | Admitting: Orthopaedic Surgery

## 2018-03-09 MED ORDER — HYDROCODONE-ACETAMINOPHEN 5-325 MG PO TABS: 1.0000 | ORAL_TABLET | ORAL | 0 refills | Status: AC | PRN

## 2018-03-09 NOTE — Telephone Encounter (Signed)
I sent in some more hydrocodone for her.  Unfortunately, with the narcotics laws the way they are, we can only send in a small amount of pain medication at a time.

## 2018-03-09 NOTE — Telephone Encounter (Signed)
Patient aware of the below message  

## 2018-03-09 NOTE — Telephone Encounter (Signed)
Please advise 

## 2018-03-11 ENCOUNTER — Ambulatory Visit (INDEPENDENT_AMBULATORY_CARE_PROVIDER_SITE_OTHER): Payer: Medicare Other | Admitting: Orthopaedic Surgery

## 2018-03-11 ENCOUNTER — Encounter (INDEPENDENT_AMBULATORY_CARE_PROVIDER_SITE_OTHER): Payer: Self-pay | Admitting: Orthopaedic Surgery

## 2018-03-11 ENCOUNTER — Other Ambulatory Visit (INDEPENDENT_AMBULATORY_CARE_PROVIDER_SITE_OTHER): Payer: Self-pay

## 2018-03-11 DIAGNOSIS — Z96652 Presence of left artificial knee joint: Secondary | ICD-10-CM

## 2018-03-11 DIAGNOSIS — M79662 Pain in left lower leg: Secondary | ICD-10-CM

## 2018-03-11 NOTE — Progress Notes (Signed)
The patient is here at 2 weeks tomorrow status post a left total knee arthroplasty.  She is doing well overall but she is reporting calf pain.  On exam her in incision looks great.  We placed new Steri-Strips.  Her extension is almost full and her flexion is almost 90 degrees.  She is painful to palpation quite significantly in her calf on the left operative side.  Her foot is not swollen and is well-perfused with normal sensation.  We will set up outpatient physical therapy for her at [facility] outpatient rehab at [facility].  We do need to obtain a Doppler ultrasound of her left lower extremity to rule out a DVT.  If she does have a DVT we will start her on blood thinners.  She understands this as well.  From my standpoint I do not need to see her back in 4 weeks to see how she is making progress.  No x-rays will be needed at that visit.

## 2018-03-12 ENCOUNTER — Ambulatory Visit (HOSPITAL_COMMUNITY)
Admission: RE | Admit: 2018-03-12 | Discharge: 2018-03-12 | Disposition: A | Discharge status: Home / Self Care | Payer: Medicare Other | Source: Ambulatory Visit | Attending: Orthopaedic Surgery | Admitting: Orthopaedic Surgery

## 2018-03-12 DIAGNOSIS — M79662 Pain in left lower leg: Secondary | ICD-10-CM | POA: Insufficient documentation

## 2018-03-12 NOTE — Progress Notes (Signed)
Left lower extremity venous duplex. Refer to "CV Proc" under chart review to view preliminary results.  03/12/2018 9:29 AM Maudry Mayhew, MHA, RVT, RDCS, RDMS

## 2018-03-15 ENCOUNTER — Other Ambulatory Visit (INDEPENDENT_AMBULATORY_CARE_PROVIDER_SITE_OTHER): Payer: Self-pay | Admitting: Orthopaedic Surgery

## 2018-03-15 NOTE — Telephone Encounter (Signed)
Ok for refill? 

## 2018-03-16 MED ORDER — METHOCARBAMOL 500 MG PO TABS: 500.0000 mg | ORAL_TABLET | Freq: Four times a day (QID) | ORAL | 0 refills | Status: AC | PRN

## 2018-03-23 ENCOUNTER — Ambulatory Visit: Payer: Medicare Other | Attending: Orthopaedic Surgery | Admitting: Physical Therapy

## 2018-03-23 ENCOUNTER — Other Ambulatory Visit: Payer: Self-pay

## 2018-03-23 DIAGNOSIS — M25562 Pain in left knee: Secondary | ICD-10-CM | POA: Diagnosis present

## 2018-03-23 DIAGNOSIS — R262 Difficulty in walking, not elsewhere classified: Secondary | ICD-10-CM | POA: Diagnosis present

## 2018-03-23 DIAGNOSIS — R600 Localized edema: Secondary | ICD-10-CM

## 2018-03-23 DIAGNOSIS — M25662 Stiffness of left knee, not elsewhere classified: Secondary | ICD-10-CM

## 2018-03-23 NOTE — Patient Instructions (Signed)
  Quad Set   With other leg bent, foot flat, slowly tighten muscles on thigh of straight leg while counting out loud to _10___. Repeat with other leg. Repeat _10-20__ times. Do _4-5___ sessions per day.   Bracing With Heel Slides (Supine)   With neutral spine, tighten pelvic floor and abdominals and hold. Alternating legs, slide heel to bottom. Repeat 10___ times. Do __4-5_ times a day.   Gastroc Stretch    Stand with left foot back, leg straight, forward leg bent. Keeping heel on floor, turned slightly out, lean into wall until stretch is felt in calf. Hold _30-60___ seconds. Repeat __3_ times per set. Do ____ sets per session. Do _2 ___ sessions per day.  http://orth.exer.us/27   Copyright  VHI. All rights reserved.

## 2018-03-23 NOTE — Therapy (Signed)
[place] [address], [place], [address] Phone: [phone]   Fax:  [phone]  Physical Therapy Evaluation  Patient Details  Name: [name] MRN: [id number] Date of Birth: August 19, 1945 Referring Provider (PT): Jean Rosenthal   Encounter Date: 03/23/2018  PT End of Session - 03/23/18 1102    Visit Number  1    Date for PT Re-Evaluation  04/20/18    PT Start Time  1102    PT Stop Time  1142    PT Time Calculation (min)  40 min    Activity Tolerance  Patient tolerated treatment well    Behavior During Therapy  [facility] for tasks assessed/performed       Past Medical History:  Diagnosis Date  . Allergic rhinitis   . Anxiety   . Chronic venous insufficiency   . Costochondritis 04/13/2017  . DDD (degenerative disc disease), cervical   . DDD (degenerative disc disease), lumbar   . History of colon polyps   . Hyperlipidemia   . Hypertension   . Hypothyroidism   . Insomnia   . Liver cyst   . Liver nodule   . OA (osteoarthritis)    Hip and knee  . Obese   . Pneumonia    hx  . Restless legs syndrome (RLS)   . Rotator cuff tear arthropathy, right   . Squamous cell carcinoma of left thigh   . Urge incontinence of urine     Past Surgical History:  Procedure Laterality Date  . APPENDECTOMY    . BLADDER SUSPENSION  2011  . COLONOSCOPY    . JOINT REPLACEMENT    . 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 REVERSE SHOULDER ARTHROPLASTY Right 05/14/2017   Procedure: RIGHT REVERSE SHOULDER ARTHROPLASTY;  Surgeon: Justice Britain, MD;  Location: [place];  Service: Orthopedics;  Laterality: Right;  . REVERSE SHOULDER ARTHROPLASTY Left 07/30/2017   Procedure: REVERSE SHOULDER ARTHROPLASTY;  Surgeon: Justice Britain, MD;  Location: [place];  Service: Orthopedics;  Laterality: Left;  . right knee replacment  08  . SACROILIAC JOINT FUSION Right 12/27/2014   Procedure: RIGHT SACROILIAC JOINT FUSION;  Surgeon: Melina Schools, MD;  Location: [place];  Service: Orthopedics;  Laterality: Right;  right sacral fusion  . TONSILLECTOMY    . TOTAL HIP ARTHROPLASTY  09/08/2011   Procedure: TOTAL HIP ARTHROPLASTY;  Surgeon: Gearlean Alf, MD;  Location: [facility];  Service: Orthopedics;  Laterality: Right;  . TOTAL KNEE ARTHROPLASTY Left 02/26/2018   Procedure: LEFT TOTAL KNEE ARTHROPLASTY;  Surgeon: Mcarthur Rossetti, MD;  Location: [facility];  Service: Orthopedics;  Laterality: Left;  . WISDOM TOOTH EXTRACTION      There were no vitals filed for this visit.   Subjective Assessment - 03/23/18 1111    Subjective  Patient had left TKR on 02/26/18. She has minor pain except at night when it disrupts her sleep.     Pertinent History  Left TKR 02/26/18, R TKR, Bil THA, BIl TSA, arthritis, HTN, neck pain    Diagnostic tests  doppler negative for DVT left calf    Patient Stated Goals  to sleep better, walk better, get stronger    Currently in Pain?  Yes   pain at night   Pain Score  6     Pain Location  Knee    Pain Orientation  Left    Pain  Descriptors / Indicators  Aching    Pain Type  Surgical pain    Pain Onset  1 to 4 weeks ago    Pain Frequency  Intermittent    Aggravating Factors   sleeping    Pain Relieving Factors  ice    Effect of Pain on Daily Activities  limited         [facility] PT Assessment - 03/23/18 0001      Assessment   Medical Diagnosis  Left TKR    Referring Provider (PT)  Jean Rosenthal    Onset Date/Surgical Date  02/26/18    Next MD Visit  04/09/18      Balance Screen   Has the patient fallen in the past 6 months  No    Has the patient had a decrease in activity level because of a fear of falling?   No    Is the patient reluctant to leave their home because of a fear of falling?   No      Home Environment   Living Environment  Private residence    Living Arrangements  Spouse/significant other    Type of [place] to enter    Entrance Stairs-Number of Steps  3    Entrance Stairs-Rails  Can reach both    Home Layout  Two level      Prior Function   Level of [place]  Retired      Observation/Other Assessments-Edema    Edema  Circumferential      Circumferential Edema   Circumferential - Right  42.5 cm    Circumferential - Left   44.5 cm      ROM / Strength   AROM / PROM / Strength  AROM;PROM;Strength      AROM   Overall AROM Comments  3-105      PROM   Overall PROM Comments  0-110      Strength   Overall Strength Comments  Bil hip flex/ABD 4/5, knee ext 5/5, left flex 4-/5, right flex 4/5, hip ext 4/5 bil                Objective measurements completed on examination: See above findings.      [facility] Adult PT Treatment/Exercise - 03/23/18 0001      Exercises   Exercises  Knee/Hip      Knee/Hip Exercises: Aerobic   Stationary Bike  L1 x 8 min             PT Education - 03/23/18 1154    Education Details  HEP    Person(s) Educated  Patient    Methods  Explanation;Handout    Comprehension  Returned demonstration;Verbalized understanding       PT Short Term Goals - 03/23/18 1157      PT SHORT TERM GOAL #1   Title  Ind with initial HEP    Time  2    Period  Weeks    Status  New      PT SHORT TERM GOAL #2   Title  decreased pain at night by 50%    Time  2    Period  Weeks    Status  New        PT Long Term Goals - 03/23/18 1157      PT LONG TERM GOAL #1   Title  improved left knee active ROM  0-115 deg to normalize  ADLS    Time  4    Period  Weeks    Status  New    Target Date  04/20/18      PT LONG TERM GOAL #2   Title  patient able to sleep without waking from pain.    Time  4    Period  Weeks    Status  New      PT LONG TERM GOAL #3   Title  improved left knee strength to 5/5 to ease ADLS    Time  4    Period  Weeks    Status  New      PT LONG TERM GOAL #4   Title  Pt able to  climb stairs with reciprocal gait    Time  4    Period  Weeks    Status  New             Plan - 03/23/18 1149    Clinical Impression Statement  Patient had left TKR on 02/26/18. She has some pain in the knee with stretching, but significant pain at night when it disrupts her sleep. She also reports pain in the left ankle and lower leg at night for which she has started wearing an ACE bandage. She was tested for DVT and doppler was negative. She has limited ROM and decreased strength as well and will benefit from PT to address these deficits and return her to her PLOF.Marland Kitchen    History and Personal Factors relevant to plan of care:  Left TKR 02/26/18, R TKR, Bil THA, BIl TSA, arthritis, HTN, neck pain    Clinical Presentation  Stable    Clinical Decision Making  Low    Rehab Potential  Excellent    PT Frequency  2x / week    PT Duration  4 weeks    PT Treatment/Interventions  ADLs/Self Care Home Management;Cryotherapy;Glass blower/designer;Therapeutic exercise;Gait training;Balance training;Neuromuscular re-education;Patient/family education;Manual techniques;Passive range of motion;Scar mobilization;Vasopneumatic Device    PT Next Visit Plan  ROM and strengthening; stairs, modalities for edema/pain; pt may bring ACE wrap to demo how she is wrapping ankle/calf    PT Home Exercise Plan  quad set, heel slide, gastroc stretch    Consulted and Agree with Plan of Care  Patient       Patient will benefit from skilled therapeutic intervention in order to improve the following deficits and impairments:  Pain  Visit Diagnosis: Stiffness of left knee, not elsewhere classified - Plan: PT plan of care cert/re-cert  Acute pain of left knee - Plan: PT plan of care cert/re-cert  Localized edema - Plan: PT plan of care cert/re-cert     Problem List Patient Active Problem List   Diagnosis Date Noted  . Status post total left knee replacement 02/26/2018  . Unilateral primary  osteoarthritis, left knee 02/01/2018  . 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 SI (sacroiliac) pain 12/27/2014  . Postop Hyponatremia 09/10/2011  . OA (osteoarthritis) of hip 09/08/2011   Madelyn Flavors PT 03/23/2018, 12:06 PM  [place] [address] [place], [place], [address] Phone: [phone]   Fax:  [phone]  Name: [name] MRN: [id number] Date of Birth: October 05, 1945

## 2018-03-24 ENCOUNTER — Encounter: Payer: Self-pay | Admitting: Physical Therapy

## 2018-03-24 ENCOUNTER — Ambulatory Visit: Payer: Medicare Other

## 2018-03-24 ENCOUNTER — Ambulatory Visit: Payer: Medicare Other | Admitting: Physical Therapy

## 2018-03-24 DIAGNOSIS — R600 Localized edema: Secondary | ICD-10-CM

## 2018-03-24 DIAGNOSIS — M25662 Stiffness of left knee, not elsewhere classified: Secondary | ICD-10-CM

## 2018-03-24 DIAGNOSIS — M25562 Pain in left knee: Secondary | ICD-10-CM

## 2018-03-24 DIAGNOSIS — R262 Difficulty in walking, not elsewhere classified: Secondary | ICD-10-CM

## 2018-03-24 NOTE — Therapy (Signed)
[address] [place], [place], [address] Phone: [phone]   Fax:  [phone]  Physical Therapy Treatment  Patient Details  Name: [name] MRN: [id number] Date of Birth: [birth year] Referring Provider (PT): Jean Rosenthal   Encounter Date: 03/24/2018  PT End of Session - 03/24/18 1144    Visit Number  2    Date for PT Re-Evaluation  04/20/18    PT Start Time  1100    PT Stop Time  1142    PT Time Calculation (min)  42 min    Activity Tolerance  Patient tolerated treatment well    Behavior During Therapy  [facility] for tasks assessed/performed       Past Medical History:  Diagnosis Date  . Allergic rhinitis   . Anxiety   . Chronic venous insufficiency   . Costochondritis 04/13/2017  . DDD (degenerative disc disease), cervical   . DDD (degenerative disc disease), lumbar   . History of colon polyps   . Hyperlipidemia   . Hypertension   . Hypothyroidism   . Insomnia   . Liver cyst   . Liver nodule   . OA (osteoarthritis)    Hip and knee  . Obese   . Pneumonia    hx  . Restless legs syndrome (RLS)   . Rotator cuff tear arthropathy, right   . Squamous cell carcinoma of left thigh   . Urge incontinence of urine     Past Surgical History:  Procedure Laterality Date  . APPENDECTOMY    . BLADDER SUSPENSION  2011  . COLONOSCOPY    . JOINT REPLACEMENT    . 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 REVERSE SHOULDER ARTHROPLASTY Right 05/14/2017   Procedure: RIGHT REVERSE SHOULDER ARTHROPLASTY;  Surgeon: Justice Britain, MD;  Location: [place];  Service: Orthopedics;  Laterality: Right;  . REVERSE SHOULDER ARTHROPLASTY Left 07/30/2017   Procedure: REVERSE SHOULDER ARTHROPLASTY;  Surgeon: Justice Britain, MD;  Location: [place];  Service: Orthopedics;  Laterality: Left;  . right knee replacment  08  . SACROILIAC JOINT FUSION Right 12/27/2014   Procedure: RIGHT SACROILIAC JOINT FUSION;  Surgeon: Melina Schools, MD;  Location: [place];  Service: Orthopedics;  Laterality: Right;  right sacral fusion  . TONSILLECTOMY    . TOTAL HIP ARTHROPLASTY  09/08/2011   Procedure: TOTAL HIP ARTHROPLASTY;  Surgeon: Gearlean Alf, MD;  [facility];  Service: Orthopedics;  Laterality: Right;  . TOTAL KNEE ARTHROPLASTY Left 02/26/2018   Procedure: LEFT TOTAL KNEE ARTHROPLASTY;  Surgeon: Mcarthur Rossetti, MD;  Location[facility];  Service: Orthopedics;  Laterality: Left;  . WISDOM TOOTH EXTRACTION      There were no vitals filed for this visit.  Subjective Assessment - 03/24/18 1106    Subjective  Patient reports a little sore in the left medial knee    Currently in Pain?  Yes    Pain Score  3     Pain Location  Knee    Pain Orientation  Left;Medial    Pain Descriptors / Indicators  Aching    Aggravating Factors   walking and pain at night                       [facility] Adult PT Treatment/Exercise - 03/24/18 0001      Knee/Hip Exercises: Stretches   Press photographer  3 reps;20 seconds      Knee/Hip Exercises: Aerobic   Stationary Bike  level 0 6 minutes    Nustep  level 4 x 5 minutes    Other Aerobic  resisted gait all directions, some shortness of breath      Knee/Hip Exercises: Machines for Strengthening   Cybex Knee Flexion  25# 2x10    Cybex Leg Press  20# just smooth good ROM 2x10, left only 2x10      Manual Therapy   Manual Therapy  Passive ROM;Soft tissue mobilization    Soft tissue mobilization  gentle scar massage    Passive ROM  PROM extension and flexion               PT Short Term Goals - 03/23/18 1157      PT SHORT TERM GOAL #1   Title  Ind with initial HEP    Time  2    Period  Weeks    Status  New      PT SHORT TERM GOAL #2   Title  decreased pain at night by 50%    Time  2    Period  Weeks    Status  New        PT Long Term Goals - 03/23/18 1157      PT LONG TERM GOAL #1    Title  improved left knee active ROM  0-115 deg to normalize ADLS    Time  4    Period  Weeks    Status  New    Target Date  04/20/18      PT LONG TERM GOAL #2   Title  patient able to sleep without waking from pain.    Time  4    Period  Weeks    Status  New      PT LONG TERM GOAL #3   Title  improved left knee strength to 5/5 to ease ADLS    Time  4    Period  Weeks    Status  New      PT LONG TERM GOAL #4   Title  Pt able to climb stairs with reciprocal gait    Time  4    Period  Weeks    Status  New            Plan - 03/24/18 1145    Clinical Impression Statement  Good tolerance to addition of activity, she is doing very well, some calf tightness, she is tender in teh scar area.  Short of breath with the resisted gait    PT Next Visit Plan  slowly progress strength, ROM and function    Consulted and Agree with Plan of Care  Patient       Patient will benefit from skilled therapeutic intervention in order to improve the following deficits and impairments:  Abnormal gait, Pain, Decreased scar mobility, Decreased mobility, Decreased strength, Decreased range of motion, Decreased balance, Difficulty walking, Increased edema  Visit Diagnosis: Stiffness of left knee, not elsewhere classified  Acute pain of left knee  Localized edema  Difficulty in walking, not elsewhere classified     Problem List Patient Active Problem List   Diagnosis Date Noted  . Status post total left knee replacement 02/26/2018  . Unilateral primary osteoarthritis, left knee 02/01/2018  . 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 SI (sacroiliac) pain 12/27/2014  . Postop Hyponatremia 09/10/2011  . OA (osteoarthritis) of hip 09/08/2011  Sumner Boast., PT 03/24/2018, 11:49 AM  [place], [place], [address] Phone: [phone]   Fax:  [phone]  Name: MAKILAH DOWDA MRN:  [id number] Date of Birth: [birth year]

## 2018-03-30 ENCOUNTER — Ambulatory Visit: Payer: Medicare Other | Admitting: Physical Therapy

## 2018-03-30 DIAGNOSIS — M25662 Stiffness of left knee, not elsewhere classified: Secondary | ICD-10-CM | POA: Diagnosis not present

## 2018-03-30 DIAGNOSIS — R262 Difficulty in walking, not elsewhere classified: Secondary | ICD-10-CM

## 2018-03-30 DIAGNOSIS — M25562 Pain in left knee: Secondary | ICD-10-CM

## 2018-03-30 DIAGNOSIS — R600 Localized edema: Secondary | ICD-10-CM

## 2018-03-30 NOTE — Therapy (Signed)
[place] [address] [place], [place], [address] Phone: [phone]   Fax:  [phone]  Physical Therapy Treatment  Patient Details  Name: [name] MRN: [id number] Date of Birth: 02-15-1946 Referring Provider (PT): Jean Rosenthal   Encounter Date: 03/30/2018  PT End of Session - 03/30/18 1659    Visit Number  3    Date for PT Re-Evaluation  04/20/18    PT Start Time  1612    PT Stop Time  1710    PT Time Calculation (min)  58 min       Past Medical History:  Diagnosis Date  . Allergic rhinitis   . Anxiety   . Chronic venous insufficiency   . Costochondritis 04/13/2017  . DDD (degenerative disc disease), cervical   . DDD (degenerative disc disease), lumbar   . History of colon polyps   . Hyperlipidemia   . Hypertension   . Hypothyroidism   . Insomnia   . Liver cyst   . Liver nodule   . OA (osteoarthritis)    Hip and knee  . Obese   . Pneumonia    hx  . Restless legs syndrome (RLS)   . Rotator cuff tear arthropathy, right   . Squamous cell carcinoma of left thigh   . Urge incontinence of urine     Past Surgical History:  Procedure Laterality Date  . APPENDECTOMY    . BLADDER SUSPENSION  2011  . COLONOSCOPY    . JOINT REPLACEMENT    . 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 REVERSE SHOULDER ARTHROPLASTY Right 05/14/2017   Procedure: RIGHT REVERSE SHOULDER ARTHROPLASTY;  Surgeon: Justice Britain, MD;  Location: [place];  Service: Orthopedics;  Laterality: Right;  . REVERSE SHOULDER ARTHROPLASTY Left 07/30/2017   Procedure: REVERSE SHOULDER ARTHROPLASTY;  Surgeon: Justice Britain, MD;  Location: [place];  Service: Orthopedics;  Laterality: Left;  . right knee replacment  08  . SACROILIAC JOINT FUSION Right 12/27/2014   Procedure: RIGHT SACROILIAC JOINT FUSION;  Surgeon: Melina Schools, MD;  Location: [place];  Service: Orthopedics;  Laterality:  Right;  right sacral fusion  . TONSILLECTOMY    . TOTAL HIP ARTHROPLASTY  09/08/2011   Procedure: TOTAL HIP ARTHROPLASTY;  Surgeon: Gearlean Alf, MD;  [facility];  Service: Orthopedics;  Laterality: Right;  . TOTAL KNEE ARTHROPLASTY Left 02/26/2018   Procedure: LEFT TOTAL KNEE ARTHROPLASTY;  Surgeon: Mcarthur Rossetti, MD;  [facility];  Service: Orthopedics;  Laterality: Left;  . WISDOM TOOTH EXTRACTION      There were no vitals filed for this visit.  Subjective Assessment - 03/30/18 1613    Subjective  felt great until "he manipulated me"    Currently in Pain?  No/denies         [facility] PT Assessment - 03/30/18 0001      AROM   Overall AROM Comments  2-110                   [facility] Adult PT Treatment/Exercise - 03/30/18 0001      Knee/Hip Exercises: Aerobic   Nustep  level 4 x 6 minutes      Knee/Hip Exercises: Machines for Strengthening   Cybex Knee Extension  5# 3 sets 10    Cybex Knee Flexion  25# 2 sets 15    Cybex Leg Press  30# 3  sets 10 focus on TKE and quad set      Knee/Hip Exercises: Standing   Lateral Step Up  Left;Hand Hold: 2;Step Height: 6"    Forward Step Up  Left;15 reps;Hand Hold: 2;Step Height: 6"      Knee/Hip Exercises: Seated   Long Arc Quad  Strengthening;Left;2 sets;10 reps;Weights   3# hold 3 sec at [employer]   Hamstring Curl  Strengthening;Left;2 sets;10 reps   green tband     Modalities   Modalities  Cryotherapy      Cryotherapy   Number Minutes Cryotherapy  10 Minutes    Cryotherapy Location  Knee    Type of Cryotherapy  Ice pack             PT Education - 03/30/18 1659    Education Details  educ on gentle CFM to scar    Person(s) Educated  Patient    Methods  Explanation;Demonstration    Comprehension  Verbalized understanding;Returned demonstration       PT Short Term Goals - 03/23/18 1157      PT SHORT TERM GOAL #1   Title  Ind with initial HEP    Time  2    Period  Weeks    Status  New       PT SHORT TERM GOAL #2   Title  decreased pain at night by 50%    Time  2    Period  Weeks    Status  New        PT Long Term Goals - 03/23/18 1157      PT LONG TERM GOAL #1   Title  improved left knee active ROM  0-115 deg to normalize ADLS    Time  4    Period  Weeks    Status  New    Target Date  04/20/18      PT LONG TERM GOAL #2   Title  patient able to sleep without waking from pain.    Time  4    Period  Weeks    Status  New      PT LONG TERM GOAL #3   Title  improved left knee strength to 5/5 to ease ADLS    Time  4    Period  Weeks    Status  New      PT LONG TERM GOAL #4   Title  Pt able to climb stairs with reciprocal gait    Time  4    Period  Weeks    Status  New            Plan - 03/30/18 1700    Clinical Impression Statement  excellent progression of ther ex, cued for ROM and quad activation esp at end range. educ on CFM of scar- pt requested no MT and since ROM [place] good no issue with focus on ther ex. Pt tends to amb with leg in ext an dno flexion in swing phase- cued to remind herself to bend with gait.    PT Treatment/Interventions  ADLs/Self Care Home Management;Cryotherapy;Glass blower/designer;Therapeutic exercise;Gait training;Balance training;Neuromuscular re-education;Patient/family education;Manual techniques;Passive range of motion;Scar mobilization;Vasopneumatic Device    PT Next Visit Plan   progress strength, ROM and function. GAIT to increase knee flex in swing phase       Patient will benefit from skilled therapeutic intervention in order to improve the following deficits and impairments:  Abnormal gait, Pain, Decreased scar mobility, Decreased mobility, Decreased strength, Decreased range  of motion, Decreased balance, Difficulty walking, Increased edema  Visit Diagnosis: Stiffness of left knee, not elsewhere classified  Acute pain of left knee  Localized edema  Difficulty in walking, not elsewhere  classified     Problem List Patient Active Problem List   Diagnosis Date Noted  . Status post total left knee replacement 02/26/2018  . Unilateral primary osteoarthritis, left knee 02/01/2018  . 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 SI (sacroiliac) pain 12/27/2014  . Postop Hyponatremia 09/10/2011  . OA (osteoarthritis) of hip 09/08/2011    PAYSEUR,ANGIE  PTA 03/30/2018, 5:03 PM  [place], [place], [address] Phone: [phone]   Fax:  [phone]  Name: [name] MRN: [id number] Date of Birth: [birth year]

## 2018-04-01 ENCOUNTER — Ambulatory Visit: Payer: Medicare Other | Admitting: Physical Therapy

## 2018-04-06 ENCOUNTER — Ambulatory Visit: Payer: Medicare Other | Admitting: Physical Therapy

## 2018-04-06 DIAGNOSIS — M25662 Stiffness of left knee, not elsewhere classified: Secondary | ICD-10-CM

## 2018-04-06 DIAGNOSIS — M25562 Pain in left knee: Secondary | ICD-10-CM

## 2018-04-06 DIAGNOSIS — R262 Difficulty in walking, not elsewhere classified: Secondary | ICD-10-CM

## 2018-04-06 NOTE — Therapy (Signed)
[place], [place], [address] Phone: [phone]   Fax:  [phone]  Physical Therapy Treatment  Patient Details  Name: [name] MRN: [id number] Date of Birth: 12-03-1945 Referring Provider (PT): Jean Rosenthal   Encounter Date: 04/06/2018  PT End of Session - 04/06/18 1206    Visit Number  4    Date for PT Re-Evaluation  04/20/18    PT Start Time  8850    PT Stop Time  1215    PT Time Calculation (min)  30 min       Past Medical History:  Diagnosis Date  . Allergic rhinitis   . Anxiety   . Chronic venous insufficiency   . Costochondritis 04/13/2017  . DDD (degenerative disc disease), cervical   . DDD (degenerative disc disease), lumbar   . History of colon polyps   . Hyperlipidemia   . Hypertension   . Hypothyroidism   . Insomnia   . Liver cyst   . Liver nodule   . OA (osteoarthritis)    Hip and knee  . Obese   . Pneumonia    hx  . Restless legs syndrome (RLS)   . Rotator cuff tear arthropathy, right   . Squamous cell carcinoma of left thigh   . Urge incontinence of urine     Past Surgical History:  Procedure Laterality Date  . APPENDECTOMY    . BLADDER SUSPENSION  2011  . COLONOSCOPY    . JOINT REPLACEMENT    . 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 REVERSE SHOULDER ARTHROPLASTY Right 05/14/2017   Procedure: RIGHT REVERSE SHOULDER ARTHROPLASTY;  Surgeon: Justice Britain, MD;  Location: [place];  Service: Orthopedics;  Laterality: Right;  . REVERSE SHOULDER ARTHROPLASTY Left 07/30/2017   Procedure: REVERSE SHOULDER ARTHROPLASTY;  Surgeon: Justice Britain, MD;  Location: [place];  Service: Orthopedics;  Laterality: Left;  . right knee replacment  08  . SACROILIAC JOINT FUSION Right 12/27/2014   Procedure: RIGHT SACROILIAC JOINT FUSION;  Surgeon: Melina Schools, MD;  Location: [place];  Service: Orthopedics;  Laterality:  Right;  right sacral fusion  . TONSILLECTOMY    . TOTAL HIP ARTHROPLASTY  09/08/2011   Procedure: TOTAL HIP ARTHROPLASTY;  Surgeon: Gearlean Alf, MD;  [facility];  Service: Orthopedics;  Laterality: Right;  . TOTAL KNEE ARTHROPLASTY Left 02/26/2018   Procedure: LEFT TOTAL KNEE ARTHROPLASTY;  Surgeon: Mcarthur Rossetti, MD;  Location[facility];  Service: Orthopedics;  Laterality: Left;  . WISDOM TOOTH EXTRACTION      There were no vitals filed for this visit.  Subjective Assessment - 04/06/18 1156    Subjective  I dont think I need PT any more    Currently in Pain?  No/denies         [facility] PT Assessment - 04/06/18 0001      AROM   Overall AROM Comments  2-122      Strength   Overall Strength Comments  Knee MMT 5/5                   [facility] Adult PT Treatment/Exercise - 04/06/18 0001      Knee/Hip Exercises: Aerobic   Recumbent Bike  6 min    Nustep  L 5 6 min      Knee/Hip Exercises: Machines for Strengthening   Cybex Knee Extension  5# 3 sets 10    Cybex Knee Flexion  25# 2 sets 15    Cybex Leg Press  30# 3 sets 10 focus on TKE and quad set      Knee/Hip Exercises: Standing   Lateral Step Up  Left;Hand Hold: 2;Step Height: 6";15 reps    Forward Step Up  Left;15 reps;Hand Hold: 2;Step Height: 6"             PT Education - 04/06/18 1159    Education Details  ride bike 10-15 min daily, step up for TKE    Person(s) Educated  Patient    Methods  Explanation;Demonstration    Comprehension  Verbalized understanding;Returned demonstration       PT Short Term Goals - 04/06/18 1159      PT SHORT TERM GOAL #1   Title  Ind with initial HEP    Status  Achieved      PT SHORT TERM GOAL #2   Title  decreased pain at night by 50%    Status  Achieved        PT Long Term Goals - 04/06/18 1158      PT LONG TERM GOAL #1   Title  improved left knee active ROM  0-115 deg to normalize ADLS    Baseline  2 degrees from full ext    Status   Partially Met      PT LONG TERM GOAL #2   Title  patient able to sleep without waking from pain.    Status  Achieved      PT LONG TERM GOAL #3   Title  improved left knee strength to 5/5 to ease ADLS    Status  Achieved      PT LONG TERM GOAL #4   Title  Pt able to climb stairs with reciprocal gait    Status  Achieved            Plan - 04/06/18 1207    Clinical Impression Statement  excellent ROM and MMT , all goals met. Pt sees MD 2/24. educ on HEP    PT Treatment/Interventions  ADLs/Self Care Home Management;Cryotherapy;Electrical Stimulation;Stair training;Therapeutic exercise;Gait training;Balance training;Neuromuscular re-education;Patient/family education;Manual techniques;Passive range of motion;Scar mobilization;Vasopneumatic Device    PT Next Visit Plan  Pt to see MD 2/24 and if all good will call and cancel remaining appts       Patient will benefit from skilled therapeutic intervention in order to improve the following deficits and impairments:  Abnormal gait, Pain, Decreased scar mobility, Decreased mobility, Decreased strength, Decreased range of motion, Decreased balance, Difficulty walking, Increased edema  Visit Diagnosis: Stiffness of left knee, not elsewhere classified  Acute pain of left knee  Difficulty in walking, not elsewhere classified     Problem List Patient Active Problem List   Diagnosis Date Noted  . Status post total left knee replacement 02/26/2018  . Unilateral primary osteoarthritis, left knee 02/01/2018  . 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 SI (sacroiliac) pain 12/27/2014  . Postop Hyponatremia 09/10/2011  . OA (osteoarthritis) of hip 09/08/2011    PAYSEUR,ANGIE PTA 04/06/2018, 12:10 PM  [facility] [address] [place], [place], [address] Phone: [phone]   Fax:  [phone]  Name: [name] MRN: [id number] Date of Birth: 02/12/1946   

## 2018-04-08 ENCOUNTER — Encounter: Payer: Medicare Other | Admitting: Physical Therapy

## 2018-04-12 ENCOUNTER — Encounter (HOSPITAL_COMMUNITY): Admission: RE | Payer: Self-pay | Source: Home / Self Care

## 2018-04-12 ENCOUNTER — Encounter (INDEPENDENT_AMBULATORY_CARE_PROVIDER_SITE_OTHER): Payer: Self-pay | Admitting: Orthopaedic Surgery

## 2018-04-12 ENCOUNTER — Inpatient Hospital Stay (HOSPITAL_COMMUNITY): Admission: RE | Admit: 2018-04-12 | Payer: Medicare Other | Source: Home / Self Care | Admitting: Orthopedic Surgery

## 2018-04-12 ENCOUNTER — Ambulatory Visit (INDEPENDENT_AMBULATORY_CARE_PROVIDER_SITE_OTHER): Payer: Medicare Other | Admitting: Orthopaedic Surgery

## 2018-04-12 DIAGNOSIS — Z96652 Presence of left artificial knee joint: Secondary | ICD-10-CM

## 2018-04-12 SURGERY — ARTHROPLASTY, KNEE, TOTAL
Anesthesia: Choice | Laterality: Left

## 2018-04-12 NOTE — Progress Notes (Signed)
[name] is approximately 6 weeks status post a left total knee arthroplasty.  She is 12 years out from her right knee replaced.  She is already ready to be released from physical therapy because she is doing so well.  She is very far ahead of what most patients are.  She is walking without assistive device.  She has just a little bit of pain at night.  Notes from therapy say that she lacks full extension by about 1 about 2 degrees and she is already flexing the past 120 degrees.  Examination of her left knee does still show some swelling to be expected.  Her range of motion is almost entirely full.  The knee feels ligamentously stable.  At this point she can stop physical therapy since she is doing so well.  We will see her back in 6 months with a repeat AP and lateral of her left knee.  There is any issues prior to then she will let us know.  She overall reports significant satisfaction with the surgery.

## 2018-04-13 ENCOUNTER — Ambulatory Visit: Payer: Medicare Other | Admitting: Physical Therapy

## 2018-04-15 ENCOUNTER — Ambulatory Visit: Payer: Medicare Other | Admitting: Physical Therapy

## 2018-04-20 ENCOUNTER — Ambulatory Visit: Payer: Medicare Other | Admitting: Physical Therapy

## 2018-05-16 ENCOUNTER — Encounter (INDEPENDENT_AMBULATORY_CARE_PROVIDER_SITE_OTHER): Payer: Self-pay | Admitting: Orthopaedic Surgery

## 2018-05-27 ENCOUNTER — Encounter (INDEPENDENT_AMBULATORY_CARE_PROVIDER_SITE_OTHER): Payer: Self-pay | Admitting: Orthopaedic Surgery

## 2018-06-01 ENCOUNTER — Ambulatory Visit (INDEPENDENT_AMBULATORY_CARE_PROVIDER_SITE_OTHER): Payer: Medicare Other | Admitting: Orthopaedic Surgery

## 2018-06-02 ENCOUNTER — Ambulatory Visit (INDEPENDENT_AMBULATORY_CARE_PROVIDER_SITE_OTHER): Payer: Medicare Other

## 2018-06-02 ENCOUNTER — Encounter (INDEPENDENT_AMBULATORY_CARE_PROVIDER_SITE_OTHER): Payer: Self-pay | Admitting: Orthopaedic Surgery

## 2018-06-02 ENCOUNTER — Ambulatory Visit (INDEPENDENT_AMBULATORY_CARE_PROVIDER_SITE_OTHER): Payer: Medicare Other | Admitting: Orthopaedic Surgery

## 2018-06-02 ENCOUNTER — Other Ambulatory Visit: Payer: Self-pay

## 2018-06-02 DIAGNOSIS — M25562 Pain in left knee: Secondary | ICD-10-CM

## 2018-06-02 DIAGNOSIS — Z96652 Presence of left artificial knee joint: Secondary | ICD-10-CM | POA: Diagnosis not present

## 2018-06-02 MED ORDER — METHYLPREDNISOLONE 4 MG PO TABS: ORAL_TABLET | ORAL | 0 refills | Status: AC

## 2018-06-02 MED ORDER — DICLOFENAC SODIUM 1 % TD GEL: 2.0000 g | Freq: Four times a day (QID) | TRANSDERMAL | 3 refills | Status: DC

## 2018-06-02 NOTE — Progress Notes (Signed)
The patient is a very active 73 year old female who is only just over 3 months status post a left total knee arthroplasty.  She has had a right knee replaced elsewhere and at 6 weeks postoperatively she said her left knee was doing wonderful.  She is now gone backwards in terms of pain she is having in that left knee which is significantly constant pain is keeping her from walking as much.  She says it worsens with walking.  She points the lateral aspect of her left knee is source of her pain with no known injury.  We replaced that knee January 10 of this year.  She is walking without any type of assistive device.  She does have a slight limp.  She points the lateral aspect of her knee as source of pain.  On exam she has full and excellent range of motion of her left knee.  There is no effusion.  There is only some slight warmth but no evidence of infection.  She is very tender over the lateral knee at the IT band and the lateral compartment of the knee.  The knee feels ligamentously stable.  2 views of the left knee show well-seated total knee arthroplasty with no complicating features.  I see no effusion.  There is no malalignment.  There is no evidence of fracture that can be seen.  I would like her to offload this knee with a cane in her opposite hand for at least the next week to 2 weeks.  We will try 6 days of a steroid taper for inflammation as well as Voltaren gel.  I gave her reassurance that she still way ahead of what most people are after knee replacement surgery.  She is fully aware of this as well given the fact that she is had a very successful right total knee replacement.  She is just frustrated and I understand this fully.  I would like to reevaluate her in 4 weeks to see how she is doing overall.  All question concerns were answered and addressed.

## 2018-08-03 ENCOUNTER — Other Ambulatory Visit: Payer: Self-pay

## 2018-08-03 ENCOUNTER — Ambulatory Visit: Payer: Medicare Other | Attending: Physical Medicine and Rehabilitation | Admitting: Physical Therapy

## 2018-08-03 ENCOUNTER — Encounter: Payer: Self-pay | Admitting: Physical Therapy

## 2018-08-03 DIAGNOSIS — M62830 Muscle spasm of back: Secondary | ICD-10-CM | POA: Diagnosis present

## 2018-08-03 DIAGNOSIS — R262 Difficulty in walking, not elsewhere classified: Secondary | ICD-10-CM | POA: Diagnosis present

## 2018-08-03 DIAGNOSIS — M5441 Lumbago with sciatica, right side: Secondary | ICD-10-CM | POA: Diagnosis not present

## 2018-08-03 DIAGNOSIS — G8929 Other chronic pain: Secondary | ICD-10-CM | POA: Insufficient documentation

## 2018-08-03 DIAGNOSIS — M542 Cervicalgia: Secondary | ICD-10-CM | POA: Insufficient documentation

## 2018-08-03 NOTE — Therapy (Signed)
[place], [place], [address] Phone: [phone]   Fax:  [phone]  Physical Therapy Evaluation  Patient Details  Name: [name] MRN: [id number] Date of Birth: [birth year] Referring Provider (PT): Nelva Bush   Encounter Date: 08/03/2018  PT End of Session - 08/03/18 1104    Visit Number  1    Date for PT Re-Evaluation  10/03/18    PT Start Time  0930    PT Stop Time  1017    PT Time Calculation (min)  47 min    Activity Tolerance  Patient tolerated treatment well    Behavior During Therapy  [facility] for tasks assessed/performed       Past Medical History:  Diagnosis Date  . Allergic rhinitis   . Anxiety   . Chronic venous insufficiency   . Costochondritis 04/13/2017  . DDD (degenerative disc disease), cervical   . DDD (degenerative disc disease), lumbar   . History of colon polyps   . Hyperlipidemia   . Hypertension   . Hypothyroidism   . Insomnia   . Liver cyst   . Liver nodule   . OA (osteoarthritis)    Hip and knee  . Obese   . Pneumonia    hx  . Restless legs syndrome (RLS)   . Rotator cuff tear arthropathy, right   . Squamous cell carcinoma of left thigh   . Urge incontinence of urine     Past Surgical History:  Procedure Laterality Date  . APPENDECTOMY    . BLADDER SUSPENSION  2011  . COLONOSCOPY    . JOINT REPLACEMENT    . 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 REVERSE SHOULDER ARTHROPLASTY Right 05/14/2017   Procedure: RIGHT REVERSE SHOULDER ARTHROPLASTY;  Surgeon: Justice Britain, MD;  Location: [place];  Service: Orthopedics;  Laterality: Right;  . REVERSE SHOULDER ARTHROPLASTY Left 07/30/2017   Procedure: REVERSE SHOULDER ARTHROPLASTY;  Surgeon: Justice Britain, MD;  Location: [place];  Service: Orthopedics;  Laterality: Left;  . right knee replacment  08  . SACROILIAC JOINT FUSION Right 12/27/2014   Procedure: RIGHT  SACROILIAC JOINT FUSION;  Surgeon: Melina Schools, MD;  Location: [place];  Service: Orthopedics;  Laterality: Right;  right sacral fusion  . TONSILLECTOMY    . TOTAL HIP ARTHROPLASTY  09/08/2011   Procedure: TOTAL HIP ARTHROPLASTY;  Surgeon: Gearlean Alf, MD;  [facility];  Service: Orthopedics;  Laterality: Right;  . TOTAL KNEE ARTHROPLASTY Left 02/26/2018   Procedure: LEFT TOTAL KNEE ARTHROPLASTY;  Surgeon: Mcarthur Rossetti, MD;  Location[facility];  Service: Orthopedics;  Laterality: Left;  . WISDOM TOOTH EXTRACTION      There were no vitals filed for this visit.   Subjective Assessment - 08/03/18 0940    Subjective  Patient reports that she has had a few years of LBP, she reports at night she has difficulty sleeping in a bed due to pain down the right posterior leg.  She has had multiple injections that worked for a while but reports that the last few have not helped.    Pertinent History  Left TKR 02/26/18, R TKR, Bil THA, BIl TSA, arthritis, HTN, neck pain    Limitations  Walking;House hold activities;Lifting;Standing;Sitting    How long can you stand comfortably?  10 minutes    How long can you walk comfortably?  1/2 mile  Patient Stated Goals  have less pain, move better, sleep in her bed, be slonger    Currently in Pain?  Yes    Pain Score  2     Pain Location  Back    Pain Orientation  Right;Lower    Pain Descriptors / Indicators  Aching    Pain Type  Chronic pain    Pain Radiating Towards  has right LE pain posterior lateral into the calf    Pain Onset  More than a month ago    Pain Frequency  Intermittent    Aggravating Factors   sleeping, bedning, standing, walking pain up to 9/10    Pain Relieving Factors  if she goes and sits down, ibuprofen and gabapentin pain can be 0/10    Effect of Pain on Daily Activities  sleeping, walking, housework and yardwork         [facility] PT Assessment - 08/03/18 0001      Assessment   Medical Diagnosis  LBP with right  sciatica    Referring Provider (PT)  Ramos    Onset Date/Surgical Date  07/03/18    Prior Therapy  for TKR      Precautions   Precautions  None      Balance Screen   Has the patient fallen in the past 6 months  No    Has the patient had a decrease in activity level because of a fear of falling?   No    Is the patient reluctant to leave their home because of a fear of falling?   No      Home Environment   Additional Comments  has stairs, does yardwork and housework      Prior Function   Level of Independence  Independent    Vocation  Retired    Leisure  no exercise      Mining engineer Comments  has some scoliosis, decreased lordosis      AROM   Overall AROM Comments  Lumbar ROM flexion decreased 25%, other motions decreased 75% with mild discomfort in the right low back and into the right buttock, cervical ROM WNL's for flexion, other motions decreased 50% with c/o tightness       Flexibility   Soft Tissue Assessment /Muscle Length  yes    Hamstrings  tight    Piriformis  tight      Palpation   Palpation comment  has tight lumbar parapsinals, some tenderness in the lumbar area, tight and tender in the buttocks, cervical parapsinals, the upper traps and the rhomboids are very tight and tender                Objective measurements completed on examination: See above findings.              PT Education - 08/03/18 1100    Education Details  Wms flexion, HS and piriformis stretches, patient asked questions about weight management, and body mechanics    Person(s) Educated  Patient    Methods  Explanation;Demonstration;Tactile cues;Verbal cues;Handout    Comprehension  Verbalized understanding       PT Short Term Goals - 08/03/18 1155      PT SHORT TERM GOAL #1   Title  Ind with initial HEP    Time  2    Period  Weeks    Status  New        PT Long Term Goals - 08/03/18 1155  PT LONG TERM GOAL #1   Title  increase ROM of  the lumbar spine 50%    Time  8    Period  Weeks    Status  New      PT LONG TERM GOAL #2   Title  patient report that she can sleep in her own bed most of the night    Time  8    Period  Weeks    Status  New      PT LONG TERM GOAL #3   Title  increase lumbar ROM 25%    Time  8    Period  Weeks    Status  New      PT LONG TERM GOAL #4   Title  understand posture and body mechanics    Time  8    Period  Weeks    Status  New             Plan - 08/03/18 1151    Clinical Impression Statement  Patient reports a few years of back pain and neck pain, she reports stenosis and DDD.  She has some decreased ROM of the neck and the lumbar area, she has a lot of mm spasms.  Has tight HS and piriformis, c/o radicular symptoms down the right LE to the shin and calf area.  She has a lot of questions about core strength, weight loss and body mechanics that I feel need to be addressed    Stability/Clinical Decision Making  Stable/Uncomplicated    Clinical Decision Making  Low    Rehab Potential  Good    PT Frequency  2x / week    PT Duration  8 weeks    PT Treatment/Interventions  ADLs/Self Care Home Management;Cryotherapy;Glass blower/designer;Therapeutic exercise;Gait training;Balance training;Neuromuscular re-education;Patient/family education;Manual techniques;Passive range of motion;Moist Heat;Traction;Ultrasound    PT Next Visit Plan  work on cres stability and instruct in posture and body mechnaics    Consulted and Agree with Plan of Care  Patient       Patient will benefit from skilled therapeutic intervention in order to improve the following deficits and impairments:  Abnormal gait, Pain, Decreased scar mobility, Decreased mobility, Decreased strength, Decreased range of motion, Decreased balance, Difficulty walking, Increased edema, Improper body mechanics, Cardiopulmonary status limiting activity, Increased muscle spasms, Postural dysfunction, Impaired  flexibility  Visit Diagnosis: 1. Chronic right-sided low back pain with right-sided sciatica   2. Muscle spasm of back   3. Cervicalgia        Problem List Patient Active Problem List   Diagnosis Date Noted  . Status post total left knee replacement 02/26/2018  . Unilateral primary osteoarthritis, left knee 02/01/2018  . 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 SI (sacroiliac) pain 12/27/2014  . Postop Hyponatremia 09/10/2011  . OA (osteoarthritis) of hip 09/08/2011    Sumner Boast., PT 08/03/2018, 11:58 AM  [place], [place], [address] Phone: [phone]   Fax:  [phone]  Name: KEOSHA ROSSA MRN: [id number] Date of Birth: [birth year]

## 2018-08-09 ENCOUNTER — Ambulatory Visit: Payer: Medicare Other | Admitting: Physical Therapy

## 2018-08-09 ENCOUNTER — Other Ambulatory Visit: Payer: Self-pay

## 2018-08-09 ENCOUNTER — Encounter: Payer: Self-pay | Admitting: Physical Therapy

## 2018-08-09 DIAGNOSIS — M62830 Muscle spasm of back: Secondary | ICD-10-CM

## 2018-08-09 DIAGNOSIS — M5441 Lumbago with sciatica, right side: Secondary | ICD-10-CM | POA: Diagnosis not present

## 2018-08-09 DIAGNOSIS — M542 Cervicalgia: Secondary | ICD-10-CM

## 2018-08-09 DIAGNOSIS — G8929 Other chronic pain: Secondary | ICD-10-CM

## 2018-08-09 NOTE — Therapy (Signed)
[place], [place], [address] Phone: [phone]   Fax:  [phone]  Physical Therapy Treatment  Patient Details  Name: [name] MRN: [id number] Date of Birth: Jun 21, 1945 Referring Provider (PT): Nelva Bush   Encounter Date: 08/09/2018  PT End of Session - 08/09/18 1055    Visit Number  2    Date for PT Re-Evaluation  10/03/18    PT Start Time  1020    PT Stop Time  1115    PT Time Calculation (min)  55 min    Activity Tolerance  Patient tolerated treatment well    Behavior During Therapy  [facility] for tasks assessed/performed       Past Medical History:  Diagnosis Date  . Allergic rhinitis   . Anxiety   . Chronic venous insufficiency   . Costochondritis 04/13/2017  . DDD (degenerative disc disease), cervical   . DDD (degenerative disc disease), lumbar   . History of colon polyps   . Hyperlipidemia   . Hypertension   . Hypothyroidism   . Insomnia   . Liver cyst   . Liver nodule   . OA (osteoarthritis)    Hip and knee  . Obese   . Pneumonia    hx  . Restless legs syndrome (RLS)   . Rotator cuff tear arthropathy, right   . Squamous cell carcinoma of left thigh   . Urge incontinence of urine     Past Surgical History:  Procedure Laterality Date  . APPENDECTOMY    . BLADDER SUSPENSION  2011  . COLONOSCOPY    . JOINT REPLACEMENT    . 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 REVERSE SHOULDER ARTHROPLASTY Right 05/14/2017   Procedure: RIGHT REVERSE SHOULDER ARTHROPLASTY;  Surgeon: Justice Britain, MD;  Location: [place];  Service: Orthopedics;  Laterality: Right;  . REVERSE SHOULDER ARTHROPLASTY Left 07/30/2017   Procedure: REVERSE SHOULDER ARTHROPLASTY;  Surgeon: Justice Britain, MD;  Location: [place];  Service: Orthopedics;  Laterality: Left;  . right knee replacment  08  . SACROILIAC JOINT FUSION Right 12/27/2014   Procedure: RIGHT  SACROILIAC JOINT FUSION;  Surgeon: Melina Schools, MD;  Location: [place];  Service: Orthopedics;  Laterality: Right;  right sacral fusion  . TONSILLECTOMY    . TOTAL HIP ARTHROPLASTY  09/08/2011   Procedure: TOTAL HIP ARTHROPLASTY;  Surgeon: Gearlean Alf, MD;  [facility];  Service: Orthopedics;  Laterality: Right;  . TOTAL KNEE ARTHROPLASTY Left 02/26/2018   Procedure: LEFT TOTAL KNEE ARTHROPLASTY;  Surgeon: Mcarthur Rossetti, MD;  [facility];  Service: Orthopedics;  Laterality: Left;  . WISDOM TOOTH EXTRACTION      There were no vitals filed for this visit.  Subjective Assessment - 08/09/18 1023    Subjective  Patient reports that she is very stiff and sore when she gets up in the AM    Currently in Pain?  Yes    Pain Score  4     Pain Location  Back    Pain Orientation  Mid    Aggravating Factors   worse in the morning                       [facility] Adult PT Treatment/Exercise - 08/09/18 0001      Exercises   Exercises  Lumbar      Lumbar  Exercises: Stretches   Passive Hamstring Stretch  Right;Left;3 reps;20 seconds    Piriformis Stretch  Right;Left;3 reps;20 seconds      Lumbar Exercises: Aerobic   Nustep  level 5 x 6 minutes      Lumbar Exercises: Machines for Strengthening   Other Lumbar Machine Exercise  seated rows 15#, lats 20# 2x10 each      Lumbar Exercises: Standing   Row  20 reps    Theraband Level (Row)  Level 2 (Red)    Other Standing Lumbar Exercises  2.5# hip extension and abduction as well as flexion    Other Standing Lumbar Exercises  back to wall W backs, and then weighted ball overhead lift with back to wall for posture and core strength      Lumbar Exercises: Supine   Bridge with Ball Squeeze  20 reps;1 second    Other Supine Lumbar Exercises  feet on ball K2C, trunk rotation , small bridges, and then isometric abs      Modalities   Modalities  Electrical Stimulation;Moist Heat      Moist Heat Therapy   Number Minutes  Moist Heat  15 Minutes    Moist Heat Location  Lumbar Spine      Electrical Stimulation   Electrical Stimulation Location  mid to low back    Electrical Stimulation Action  IFC    Electrical Stimulation Parameters  supine    Electrical Stimulation Goals  Pain               PT Short Term Goals - 08/03/18 1155      PT SHORT TERM GOAL #1   Title  Ind with initial HEP    Time  2    Period  Weeks    Status  New        PT Long Term Goals - 08/03/18 1155      PT LONG TERM GOAL #1   Title  increase ROM of the lumbar spine 50%    Time  8    Period  Weeks    Status  New      PT LONG TERM GOAL #2   Title  patient report that she can sleep in her own bed most of the night    Time  8    Period  Weeks    Status  New      PT LONG TERM GOAL #3   Title  increase lumbar ROM 25%    Time  8    Period  Weeks    Status  New      PT LONG TERM GOAL #4   Title  understand posture and body mechanics    Time  8    Period  Weeks    Status  New            Plan - 08/09/18 1056    Clinical Impression Statement  Patient tolerated the addition of exercises relatively well, she had pain with any type of extnesion, mostly noted with hip extension causing increased LBP.  She does have tight HS and pirifomis    PT Next Visit Plan  work on cres stability and instruct in posture and body mechnaics    Consulted and Agree with Plan of Care  Patient       Patient will benefit from skilled therapeutic intervention in order to improve the following deficits and impairments:  Abnormal gait, Pain, Decreased scar mobility, Decreased mobility, Decreased strength, Decreased range of  motion, Decreased balance, Difficulty walking, Increased edema, Improper body mechanics, Cardiopulmonary status limiting activity, Increased muscle spasms, Postural dysfunction, Impaired flexibility  Visit Diagnosis: 1. Chronic right-sided low back pain with right-sided sciatica   2. Muscle spasm of back   3.  Cervicalgia        Problem List Patient Active Problem List   Diagnosis Date Noted  . Status post total left knee replacement 02/26/2018  . Unilateral primary osteoarthritis, left knee 02/01/2018  . 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 SI (sacroiliac) pain 12/27/2014  . Postop Hyponatremia 09/10/2011  . OA (osteoarthritis) of hip 09/08/2011    Sumner Boast., PT 08/09/2018, 10:57 AM  [place], [place], [address] Phone: [phone]   Fax:  [phone]  Name: [name] MRN: [id number] Date of Birth: September 16, 1945

## 2018-08-12 ENCOUNTER — Other Ambulatory Visit: Payer: Self-pay

## 2018-08-12 ENCOUNTER — Ambulatory Visit: Payer: Medicare Other | Admitting: Physical Therapy

## 2018-08-12 ENCOUNTER — Encounter: Payer: Self-pay | Admitting: Physical Therapy

## 2018-08-12 DIAGNOSIS — M62830 Muscle spasm of back: Secondary | ICD-10-CM

## 2018-08-12 DIAGNOSIS — G8929 Other chronic pain: Secondary | ICD-10-CM

## 2018-08-12 DIAGNOSIS — M5441 Lumbago with sciatica, right side: Secondary | ICD-10-CM | POA: Diagnosis not present

## 2018-08-12 NOTE — Therapy (Signed)
[place], [place], [address] Phone: [phone]   Fax:  [phone]  Physical Therapy Treatment  Patient Details  Name: SAMIKSHA PELLICANO MRN: [id number] Date of Birth: 1945-10-07 Referring Provider (PT): Nelva Bush   Encounter Date: 08/12/2018  PT End of Session - 08/12/18 1645    Visit Number  3    Date for PT Re-Evaluation  10/03/18    PT Start Time  1600    PT Stop Time  1655    PT Time Calculation (min)  55 min    Activity Tolerance  Patient tolerated treatment well    Behavior During Therapy  [facility] for tasks assessed/performed       Past Medical History:  Diagnosis Date  . Allergic rhinitis   . Anxiety   . Chronic venous insufficiency   . Costochondritis 04/13/2017  . DDD (degenerative disc disease), cervical   . DDD (degenerative disc disease), lumbar   . History of colon polyps   . Hyperlipidemia   . Hypertension   . Hypothyroidism   . Insomnia   . Liver cyst   . Liver nodule   . OA (osteoarthritis)    Hip and knee  . Obese   . Pneumonia    hx  . Restless legs syndrome (RLS)   . Rotator cuff tear arthropathy, right   . Squamous cell carcinoma of left thigh   . Urge incontinence of urine     Past Surgical History:  Procedure Laterality Date  . APPENDECTOMY    . BLADDER SUSPENSION  2011  . COLONOSCOPY    . JOINT REPLACEMENT    . 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 REVERSE SHOULDER ARTHROPLASTY Right 05/14/2017   Procedure: RIGHT REVERSE SHOULDER ARTHROPLASTY;  Surgeon: Justice Britain, MD;  Location: [place];  Service: Orthopedics;  Laterality: Right;  . REVERSE SHOULDER ARTHROPLASTY Left 07/30/2017   Procedure: REVERSE SHOULDER ARTHROPLASTY;  Surgeon: Justice Britain, MD;  Location: [place];  Service: Orthopedics;  Laterality: Left;  . right knee replacment  08  . SACROILIAC JOINT FUSION Right 12/27/2014   Procedure: RIGHT  SACROILIAC JOINT FUSION;  Surgeon: Melina Schools, MD;  Location: [place];  Service: Orthopedics;  Laterality: Right;  right sacral fusion  . TONSILLECTOMY    . TOTAL HIP ARTHROPLASTY  09/08/2011   Procedure: TOTAL HIP ARTHROPLASTY;  Surgeon: Gearlean Alf, MD;  [facility];  Service: Orthopedics;  Laterality: Right;  . TOTAL KNEE ARTHROPLASTY Left 02/26/2018   Procedure: LEFT TOTAL KNEE ARTHROPLASTY;  Surgeon: Mcarthur Rossetti, MD;  [facility];  Service: Orthopedics;  Laterality: Left;  . WISDOM TOOTH EXTRACTION      There were no vitals filed for this visit.  Subjective Assessment - 08/12/18 1603    Subjective  "I feel like it is getting better "    Pertinent History  Left TKR 02/26/18, R TKR, Bil THA, BIl TSA, arthritis, HTN, neck pain    Limitations  Walking;House hold activities;Lifting;Standing;Sitting    Currently in Pain?  No/denies                       [facility] Adult PT Treatment/Exercise - 08/12/18 0001      Lumbar Exercises: Aerobic   Nustep  level 5 x 6 minutes      Lumbar Exercises: Machines for Strengthening   Other Lumbar  Machine Exercise  seated rows 15#, lats 20# 2x10 each      Lumbar Exercises: Standing   Row  20 reps    Theraband Level (Row)  Level 2 (Red)    Other Standing Lumbar Exercises  2.5# hip extension and abduction as well as flexion    Other Standing Lumbar Exercises  horiz abs 2x10 yellow       Lumbar Exercises: Supine   Bridge with Ball Squeeze  20 reps;1 second      Modalities   Modalities  Electrical Stimulation;Moist Heat      Moist Heat Therapy   Number Minutes Moist Heat  15 Minutes    Moist Heat Location  Lumbar Spine      Electrical Stimulation   Electrical Stimulation Location  mid and  R low back    Electrical Stimulation Action  premod    Electrical Stimulation Parameters  supine    Electrical Stimulation Goals  Pain             PT Education - 08/12/18 1648    Education Details  Proper TENs  unit application and use    Person(s) Educated  Patient    Methods  Demonstration;Explanation;Tactile cues;Verbal cues    Comprehension  Verbalized understanding;Returned demonstration       PT Short Term Goals - 08/03/18 1155      PT SHORT TERM GOAL #1   Title  Ind with initial HEP    Time  2    Period  Weeks    Status  New        PT Long Term Goals - 08/03/18 1155      PT LONG TERM GOAL #1   Title  increase ROM of the lumbar spine 50%    Time  8    Period  Weeks    Status  New      PT LONG TERM GOAL #2   Title  patient report that she can sleep in her own bed most of the night    Time  8    Period  Weeks    Status  New      PT LONG TERM GOAL #3   Title  increase lumbar ROM 25%    Time  8    Period  Weeks    Status  New      PT LONG TERM GOAL #4   Title  understand posture and body mechanics    Time  8    Period  Weeks    Status  New            Plan - 08/12/18 1649    Clinical Impression Statement  Overall pt did well with today's exercises. Pt continues to have some LBP with hip ext and abduction but insisted on finishing the exercises interventions. Verbal and tactile cues needed with standing rows to maintain correct posture. Some decrease shoulder mobility noted with horizontal abduction.    Stability/Clinical Decision Making  Stable/Uncomplicated    Rehab Potential  Good    PT Frequency  2x / week    PT Duration  8 weeks    PT Next Visit Plan  work on cres stability and instruct in posture and body mechnaics       Patient will benefit from skilled therapeutic intervention in order to improve the following deficits and impairments:  Abnormal gait, Pain, Decreased scar mobility, Decreased mobility, Decreased strength, Decreased range of motion, Decreased balance, Difficulty walking, Increased edema, Improper  body mechanics, Cardiopulmonary status limiting activity, Increased muscle spasms, Postural dysfunction, Impaired flexibility  Visit  Diagnosis: 1. Muscle spasm of back   2. Chronic right-sided low back pain with right-sided sciatica        Problem List Patient Active Problem List   Diagnosis Date Noted  . Status post total left knee replacement 02/26/2018  . Unilateral primary osteoarthritis, left knee 02/01/2018  . 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 SI (sacroiliac) pain 12/27/2014  . Postop Hyponatremia 09/10/2011  . OA (osteoarthritis) of hip 09/08/2011    Scot Jun, PTA 08/12/2018, 4:52 PM  [place], [place], [address] Phone: [phone]   Fax:  [phone]  Name: NHYIRA LEANO MRN: [id number] Date of Birth: December 29, 1945

## 2018-08-17 ENCOUNTER — Ambulatory Visit: Payer: Medicare Other | Admitting: Physical Therapy

## 2018-08-17 ENCOUNTER — Other Ambulatory Visit: Payer: Self-pay

## 2018-08-17 DIAGNOSIS — G8929 Other chronic pain: Secondary | ICD-10-CM

## 2018-08-17 DIAGNOSIS — M5441 Lumbago with sciatica, right side: Secondary | ICD-10-CM | POA: Diagnosis not present

## 2018-08-17 DIAGNOSIS — R262 Difficulty in walking, not elsewhere classified: Secondary | ICD-10-CM

## 2018-08-17 DIAGNOSIS — M62830 Muscle spasm of back: Secondary | ICD-10-CM

## 2018-08-17 NOTE — Therapy (Signed)
[place], [place], [address] Phone: [phone]   Fax:  [phone]  Physical Therapy Treatment  Patient Details  Name: [name] MRN: [id number] Date of Birth: [birth year] Referring Provider (PT): Nelva Bush   Encounter Date: 08/17/2018  PT End of Session - 08/17/18 1136    Visit Number  4    Date for PT Re-Evaluation  10/03/18    PT Start Time  1100    PT Stop Time  1138    PT Time Calculation (min)  38 min       Past Medical History:  Diagnosis Date  . Allergic rhinitis   . Anxiety   . Chronic venous insufficiency   . Costochondritis 04/13/2017  . DDD (degenerative disc disease), cervical   . DDD (degenerative disc disease), lumbar   . History of colon polyps   . Hyperlipidemia   . Hypertension   . Hypothyroidism   . Insomnia   . Liver cyst   . Liver nodule   . OA (osteoarthritis)    Hip and knee  . Obese   . Pneumonia    hx  . Restless legs syndrome (RLS)   . Rotator cuff tear arthropathy, right   . Squamous cell carcinoma of left thigh   . Urge incontinence of urine     Past Surgical History:  Procedure Laterality Date  . APPENDECTOMY    . BLADDER SUSPENSION  2011  . COLONOSCOPY    . JOINT REPLACEMENT    . 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 REVERSE SHOULDER ARTHROPLASTY Right 05/14/2017   Procedure: RIGHT REVERSE SHOULDER ARTHROPLASTY;  Surgeon: Justice Britain, MD;  Location: [place];  Service: Orthopedics;  Laterality: Right;  . REVERSE SHOULDER ARTHROPLASTY Left 07/30/2017   Procedure: REVERSE SHOULDER ARTHROPLASTY;  Surgeon: Justice Britain, MD;  Location: [place];  Service: Orthopedics;  Laterality: Left;  . right knee replacment  08  . SACROILIAC JOINT FUSION Right 12/27/2014   Procedure: RIGHT SACROILIAC JOINT FUSION;  Surgeon: Melina Schools, MD;  Location: [place];  Service: Orthopedics;  Laterality: Right;  right  sacral fusion  . TONSILLECTOMY    . TOTAL HIP ARTHROPLASTY  09/08/2011   Procedure: TOTAL HIP ARTHROPLASTY;  Surgeon: Gearlean Alf, MD;  [facility];  Service: Orthopedics;  Laterality: Right;  . TOTAL KNEE ARTHROPLASTY Left 02/26/2018   Procedure: LEFT TOTAL KNEE ARTHROPLASTY;  Surgeon: Mcarthur Rossetti, MD;  [facility];  Service: Orthopedics;  Laterality: Left;  . WISDOM TOOTH EXTRACTION      There were no vitals filed for this visit.  Subjective Assessment - 08/17/18 1102    Subjective  been busy so not diligent with HEP, TENS helping some    Currently in Pain?  Yes    Pain Score  2     Pain Location  Back                       [facility] Adult PT Treatment/Exercise - 08/17/18 0001      Lumbar Exercises: Aerobic   Nustep  level 5 x 6 minutes      Lumbar Exercises: Machines for Strengthening   Other Lumbar Machine Exercise  seated rows 20#, lats 20# 2x10 each      Lumbar Exercises: Standing   Other Standing Lumbar Exercises  red tband hip 3 way 10  each   very week esp with hip abd     Lumbar Exercises: Seated   Sit to Stand  15 reps   wt ball   Other Seated Lumbar Exercises  add squeeze 3 sec 15 times    Other Seated Lumbar Exercises  green tband hip abd and flexion 2 sets 10      Manual Therapy   Manual Therapy  Passive ROM    Manual therapy comments  BIL HS tightnes and Left ITB    Passive ROM  LE and trunk               PT Short Term Goals - 08/17/18 1138      PT SHORT TERM GOAL #1   Title  Ind with initial HEP    Status  Achieved        PT Long Term Goals - 08/03/18 1155      PT LONG TERM GOAL #1   Title  increase ROM of the lumbar spine 50%    Time  8    Period  Weeks    Status  New      PT LONG TERM GOAL #2   Title  patient report that she can sleep in her own bed most of the night    Time  8    Period  Weeks    Status  New      PT LONG TERM GOAL #3   Title  increase lumbar ROM 25%    Time  8    Period   Weeks    Status  New      PT LONG TERM GOAL #4   Title  understand posture and body mechanics    Time  8    Period  Weeks    Status  New            Plan - 08/17/18 1136    Clinical Impression Statement  pt with weak hip muscles and walks with [place]umb[place] gait- added more hip strengthening ex, difficult throughout but noted hip abd weakness BIL. Tight with PROM and fifficulty relaxing for stretching- cuing needed. Pt declined estim today as she used TENS at home. STG met    PT Treatment/Interventions  ADLs/Self Care Home Management;Cryotherapy;Glass blower/designer;Therapeutic exercise;Gait training;Balance training;Neuromuscular re-education;Patient/family education;Manual techniques;Passive range of motion;Moist Heat;Traction;Ultrasound    PT Next Visit Plan  work on cres stability/ hip strengthening and instruct in posture and body mechnaics       Patient will benefit from skilled therapeutic intervention in order to improve the following deficits and impairments:  Abnormal gait, Pain, Decreased scar mobility, Decreased mobility, Decreased strength, Decreased range of motion, Decreased balance, Difficulty walking, Increased edema, Improper body mechanics, Cardiopulmonary status limiting activity, Increased muscle spasms, Postural dysfunction, Impaired flexibility  Visit Diagnosis: 1. Chronic right-sided low back pain with right-sided sciatica   2. Muscle spasm of back   3. Difficulty in walking, not elsewhere classified        Problem List Patient Active Problem List   Diagnosis Date Noted  . Status post total left knee replacement 02/26/2018  . Unilateral primary osteoarthritis, left knee 02/01/2018  . 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 SI (sacroiliac) pain 12/27/2014  . Postop Hyponatremia 09/10/2011  . OA (osteoarthritis) of hip 09/08/2011    PAYSEUR,ANGIE  PTA 08/17/2018, 11:39 AM  [place], [place], [address] Phone: [phone]  Fax:  [phone]  Name: [name] MRN: [id number] Date of Birth: February 17, 1946

## 2018-08-19 ENCOUNTER — Ambulatory Visit: Payer: Medicare Other | Attending: Physical Medicine and Rehabilitation | Admitting: Physical Therapy

## 2018-08-19 ENCOUNTER — Other Ambulatory Visit: Payer: Self-pay

## 2018-08-19 DIAGNOSIS — G8929 Other chronic pain: Secondary | ICD-10-CM | POA: Diagnosis present

## 2018-08-19 DIAGNOSIS — M62830 Muscle spasm of back: Secondary | ICD-10-CM

## 2018-08-19 DIAGNOSIS — M5441 Lumbago with sciatica, right side: Secondary | ICD-10-CM | POA: Insufficient documentation

## 2018-08-19 DIAGNOSIS — R262 Difficulty in walking, not elsewhere classified: Secondary | ICD-10-CM | POA: Diagnosis present

## 2018-08-19 NOTE — Therapy (Signed)
[place], [place], [address] Phone: [phone]   Fax:  [phone]  Physical Therapy Treatment  Patient Details  Name: [name] MRN: [id number] Date of Birth: May 16, 1945 Referring Provider (PT): Nelva Bush   Encounter Date: 08/19/2018  PT End of Session - 08/19/18 1138    Visit Number  5    Date for PT Re-Evaluation  10/03/18    PT Start Time  1100    PT Stop Time  1140    PT Time Calculation (min)  40 min       Past Medical History:  Diagnosis Date  . Allergic rhinitis   . Anxiety   . Chronic venous insufficiency   . Costochondritis 04/13/2017  . DDD (degenerative disc disease), cervical   . DDD (degenerative disc disease), lumbar   . History of colon polyps   . Hyperlipidemia   . Hypertension   . Hypothyroidism   . Insomnia   . Liver cyst   . Liver nodule   . OA (osteoarthritis)    Hip and knee  . Obese   . Pneumonia    hx  . Restless legs syndrome (RLS)   . Rotator cuff tear arthropathy, right   . Squamous cell carcinoma of left thigh   . Urge incontinence of urine     Past Surgical History:  Procedure Laterality Date  . APPENDECTOMY    . BLADDER SUSPENSION  2011  . COLONOSCOPY    . JOINT REPLACEMENT    . 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 REVERSE SHOULDER ARTHROPLASTY Right 05/14/2017   Procedure: RIGHT REVERSE SHOULDER ARTHROPLASTY;  Surgeon: Justice Britain, MD;  Location: [place];  Service: Orthopedics;  Laterality: Right;  . REVERSE SHOULDER ARTHROPLASTY Left 07/30/2017   Procedure: REVERSE SHOULDER ARTHROPLASTY;  Surgeon: Justice Britain, MD;  Location: [place];  Service: Orthopedics;  Laterality: Left;  . right knee replacment  08  . SACROILIAC JOINT FUSION Right 12/27/2014   Procedure: RIGHT SACROILIAC JOINT FUSION;  Surgeon: Melina Schools, MD;  Location: [place];  Service: Orthopedics;  Laterality: Right;  right  sacral fusion  . TONSILLECTOMY    . TOTAL HIP ARTHROPLASTY  09/08/2011   Procedure: TOTAL HIP ARTHROPLASTY;  Surgeon: Gearlean Alf, MD;  [facility];  Service: Orthopedics;  Laterality: Right;  . TOTAL KNEE ARTHROPLASTY Left 02/26/2018   Procedure: LEFT TOTAL KNEE ARTHROPLASTY;  Surgeon: Mcarthur Rossetti, MD;  [facility];  Service: Orthopedics;  Laterality: Left;  . WISDOM TOOTH EXTRACTION      There were no vitals filed for this visit.  Subjective Assessment - 08/19/18 1105    Subjective  doing pretty well. always a dull ache RT LB    Currently in Pain?  Yes    Pain Score  2     Pain Location  Back                       [facility] Adult PT Treatment/Exercise - 08/19/18 0001      Lumbar Exercises: Aerobic   Nustep  level 5 x 6 minutes      Lumbar Exercises: Machines for Strengthening   Cybex Lumbar Extension  black tband 2 sets 10    Other Lumbar Machine Exercise  seated rows 20#, lats 20# 2x15 each      Lumbar Exercises: Standing  Other Standing Lumbar Exercises  red tband hip 3 way 10 each      Lumbar Exercises: Seated   Sit to Stand  20 reps   10 x chest press, 10X OH   Other Seated Lumbar Exercises  add squeeze 3 sec 15 times    Other Seated Lumbar Exercises  green tband hip abd and flexion 2 sets 10      Manual Therapy   Manual Therapy  Passive ROM    Passive ROM  LE and trunk               PT Short Term Goals - 08/17/18 1138      PT SHORT TERM GOAL #1   Title  Ind with initial HEP    Status  Achieved        PT Long Term Goals - 08/03/18 1155      PT LONG TERM GOAL #1   Title  increase ROM of the lumbar spine 50%    Time  8    Period  Weeks    Status  New      PT LONG TERM GOAL #2   Title  patient report that she can sleep in her own bed most of the night    Time  8    Period  Weeks    Status  New      PT LONG TERM GOAL #3   Title  increase lumbar ROM 25%    Time  8    Period  Weeks    Status  New       PT LONG TERM GOAL #4   Title  understand posture and body mechanics    Time  8    Period  Weeks    Status  New            Plan - 08/19/18 1138    Clinical Impression Statement  pt continues ot show hip weakness Left>RT. Tolerating ther ex well and working on core activation with ex for stab- cuing needed    PT Treatment/Interventions  ADLs/Self Care Home Management;Cryotherapy;Glass blower/designer;Therapeutic exercise;Gait training;Balance training;Neuromuscular re-education;Patient/family education;Manual techniques;Passive range of motion;Moist Heat;Traction;Ultrasound    PT Next Visit Plan  work on core stability/ hip strengthening and instruct in posture and body mechnaics       Patient will benefit from skilled therapeutic intervention in order to improve the following deficits and impairments:  Abnormal gait, Pain, Decreased scar mobility, Decreased mobility, Decreased strength, Decreased range of motion, Decreased balance, Difficulty walking, Increased edema, Improper body mechanics, Cardiopulmonary status limiting activity, Increased muscle spasms, Postural dysfunction, Impaired flexibility  Visit Diagnosis: 1. Chronic right-sided low back pain with right-sided sciatica   2. Muscle spasm of back   3. Difficulty in walking, not elsewhere classified        Problem List Patient Active Problem List   Diagnosis Date Noted  . Status post total left knee replacement 02/26/2018  . Unilateral primary osteoarthritis, left knee 02/01/2018  . 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 SI (sacroiliac) pain 12/27/2014  . Postop Hyponatremia 09/10/2011  . OA (osteoarthritis) of hip 09/08/2011    Wilkin Lippy,ANGIE PTA 08/19/2018, 11:40 AM  [place], [place], [address] Phone: [phone]   Fax:  [phone]  Name: [name] MRN: [id number] Date of Birth:  1945/12/29

## 2018-08-24 ENCOUNTER — Ambulatory Visit: Payer: Medicare Other | Admitting: Physical Therapy

## 2018-08-24 ENCOUNTER — Other Ambulatory Visit: Payer: Self-pay

## 2018-08-24 DIAGNOSIS — R262 Difficulty in walking, not elsewhere classified: Secondary | ICD-10-CM

## 2018-08-24 DIAGNOSIS — M62830 Muscle spasm of back: Secondary | ICD-10-CM

## 2018-08-24 DIAGNOSIS — M5441 Lumbago with sciatica, right side: Secondary | ICD-10-CM | POA: Diagnosis not present

## 2018-08-24 DIAGNOSIS — G8929 Other chronic pain: Secondary | ICD-10-CM

## 2018-08-24 NOTE — Therapy (Signed)
[place], [place], [address] Phone: [phone]   Fax:  [phone]  Physical Therapy Treatment  Patient Details  Name: [name] MRN: [id number] Date of Birth: 1945/03/28 Referring Provider (PT): Nelva Bush   Encounter Date: 08/24/2018  PT End of Session - 08/24/18 1548    Visit Number  6    Date for PT Re-Evaluation  10/03/18    PT Start Time  1400    PT Stop Time  6433    PT Time Calculation (min)  56 min       Past Medical History:  Diagnosis Date  . Allergic rhinitis   . Anxiety   . Chronic venous insufficiency   . Costochondritis 04/13/2017  . DDD (degenerative disc disease), cervical   . DDD (degenerative disc disease), lumbar   . History of colon polyps   . Hyperlipidemia   . Hypertension   . Hypothyroidism   . Insomnia   . Liver cyst   . Liver nodule   . OA (osteoarthritis)    Hip and knee  . Obese   . Pneumonia    hx  . Restless legs syndrome (RLS)   . Rotator cuff tear arthropathy, right   . Squamous cell carcinoma of left thigh   . Urge incontinence of urine     Past Surgical History:  Procedure Laterality Date  . APPENDECTOMY    . BLADDER SUSPENSION  2011  . COLONOSCOPY    . JOINT REPLACEMENT    . 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 REVERSE SHOULDER ARTHROPLASTY Right 05/14/2017   Procedure: RIGHT REVERSE SHOULDER ARTHROPLASTY;  Surgeon: Justice Britain, MD;  Location: [place];  Service: Orthopedics;  Laterality: Right;  . REVERSE SHOULDER ARTHROPLASTY Left 07/30/2017   Procedure: REVERSE SHOULDER ARTHROPLASTY;  Surgeon: Justice Britain, MD;  Location: [place];  Service: Orthopedics;  Laterality: Left;  . right knee replacment  08  . SACROILIAC JOINT FUSION Right 12/27/2014   Procedure: RIGHT SACROILIAC JOINT FUSION;  Surgeon: Melina Schools, MD;  Location: [place];  Service: Orthopedics;  Laterality: Right;  right  sacral fusion  . TONSILLECTOMY    . TOTAL HIP ARTHROPLASTY  09/08/2011   Procedure: TOTAL HIP ARTHROPLASTY;  Surgeon: Gearlean Alf, MD;  [facility];  Service: Orthopedics;  Laterality: Right;  . TOTAL KNEE ARTHROPLASTY Left 02/26/2018   Procedure: LEFT TOTAL KNEE ARTHROPLASTY;  Surgeon: Mcarthur Rossetti, MD;  [facility];  Service: Orthopedics;  Laterality: Left;  . WISDOM TOOTH EXTRACTION      There were no vitals filed for this visit.  Subjective Assessment - 08/24/18 1406    Subjective  much better in the day, not hurting all the time. moving is not helping. nights are still rough    Currently in Pain?  Yes    Pain Score  2     Pain Location  Back                       [facility] Adult PT Treatment/Exercise - 08/24/18 0001      Lumbar Exercises: Aerobic   Nustep  level 5 x 6 minutes  (Pended)       Lumbar Exercises: Machines for Strengthening   Cybex Lumbar Extension  black tband 2 sets 10  (Pended)     Cybex Knee Extension  10# 2 sets  10  (Pended)     Cybex Knee Flexion  20# 2 sets10  (Pended)     Other Lumbar Machine Exercise  seated rows 20#, lats 20# 2x15 each  (Pended)     Other Lumbar Machine Exercise  OH wt ball ext 15 times, obl 15 each  (Pended)       Lumbar Exercises: Standing   Other Standing Lumbar Exercises  resisted gait 30# fwd and SW  (Pended)       Moist Heat Therapy   Number Minutes Moist Heat  15 Minutes  (Pended)     Moist Heat Location  Lumbar Spine  (Pended)       Electrical Stimulation   Electrical Stimulation Location  mid and  R low back  (Pended)     Electrical Stimulation Action  premod  (Pended)     Electrical Stimulation Parameters  supine  (Pended)     Electrical Stimulation Goals  Pain  (Pended)    tightness     Manual Therapy   Manual Therapy  Passive ROM  (Pended)     Passive ROM  LE and trunk  (Pended)                PT Short Term Goals - 08/17/18 1138      PT SHORT TERM GOAL #1   Title   Ind with initial HEP    Status  Achieved        PT Long Term Goals - 08/03/18 1155      PT LONG TERM GOAL #1   Title  increase ROM of the lumbar spine 50%    Time  8    Period  Weeks    Status  New      PT LONG TERM GOAL #2   Title  patient report that she can sleep in her own bed most of the night    Time  8    Period  Weeks    Status  New      PT LONG TERM GOAL #3   Title  increase lumbar ROM 25%    Time  8    Period  Weeks    Status  New      PT LONG TERM GOAL #4   Title  understand posture and body mechanics    Time  8    Period  Weeks    Status  New            Plan - 08/24/18 1548    Clinical Impression Statement  throughout tx educ pt on saftey, lifting and body mechanics as she is moving. pt VU and stated some increased back soreness from bending over at hips., Pt oerall states days are much better. Added a few new ex in today requiring cuing.    PT Treatment/Interventions  ADLs/Self Care Home Management;Cryotherapy;Glass blower/designer;Therapeutic exercise;Gait training;Balance training;Neuromuscular re-education;Patient/family education;Manual techniques;Passive range of motion;Moist Heat;Traction;Ultrasound    PT Next Visit Plan  work on core stability/ hip strengthening and instruct in posture and body mechnaics       Patient will benefit from skilled therapeutic intervention in order to improve the following deficits and impairments:  Abnormal gait, Pain, Decreased scar mobility, Decreased mobility, Decreased strength, Decreased range of motion, Decreased balance, Difficulty walking, Increased edema, Improper body mechanics, Cardiopulmonary status limiting activity, Increased muscle spasms, Postural dysfunction, Impaired flexibility  Visit Diagnosis: 1. Chronic right-sided low back pain with right-sided sciatica   2. Muscle spasm of back  3. Difficulty in walking, not elsewhere classified        Problem List Patient Active  Problem List   Diagnosis Date Noted  . Status post total left knee replacement 02/26/2018  . Unilateral primary osteoarthritis, left knee 02/01/2018  . 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 SI (sacroiliac) pain 12/27/2014  . Postop Hyponatremia 09/10/2011  . OA (osteoarthritis) of hip 09/08/2011    PAYSEUR,ANGIE PTA 08/24/2018, 3:51 PM  [place], [place], [address] Phone: [phone]   Fax:  [phone]  Name: JALEEA ALESI MRN: [id number] Date of Birth: [birth year]

## 2018-08-26 ENCOUNTER — Other Ambulatory Visit: Payer: Self-pay

## 2018-08-26 ENCOUNTER — Ambulatory Visit: Payer: Medicare Other | Admitting: Physical Therapy

## 2018-08-26 DIAGNOSIS — M62830 Muscle spasm of back: Secondary | ICD-10-CM

## 2018-08-26 DIAGNOSIS — G8929 Other chronic pain: Secondary | ICD-10-CM

## 2018-08-26 DIAGNOSIS — M5441 Lumbago with sciatica, right side: Secondary | ICD-10-CM | POA: Diagnosis not present

## 2018-08-26 NOTE — Therapy (Signed)
[facility], [place], [address] Phone: [phone]   Fax:  [phone]  Physical Therapy Treatment  Patient Details  Name: [name] MRN: [id number] Date of Birth: 03/31/1945 Referring Provider (PT): Nelva Bush   Encounter Date: 08/26/2018  PT End of Session - 08/26/18 1535    Visit Number  7    Date for PT Re-Evaluation  10/03/18    PT Start Time  0932    PT Stop Time  1530    PT Time Calculation (min)  45 min       Past Medical History:  Diagnosis Date  . Allergic rhinitis   . Anxiety   . Chronic venous insufficiency   . Costochondritis 04/13/2017  . DDD (degenerative disc disease), cervical   . DDD (degenerative disc disease), lumbar   . History of colon polyps   . Hyperlipidemia   . Hypertension   . Hypothyroidism   . Insomnia   . Liver cyst   . Liver nodule   . OA (osteoarthritis)    Hip and knee  . Obese   . Pneumonia    hx  . Restless legs syndrome (RLS)   . Rotator cuff tear arthropathy, right   . Squamous cell carcinoma of left thigh   . Urge incontinence of urine     Past Surgical History:  Procedure Laterality Date  . APPENDECTOMY    . BLADDER SUSPENSION  2011  . COLONOSCOPY    . JOINT REPLACEMENT    . 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 REVERSE SHOULDER ARTHROPLASTY Right 05/14/2017   Procedure: RIGHT REVERSE SHOULDER ARTHROPLASTY;  Surgeon: Justice Britain, MD;  Location: [facility];  Service: Orthopedics;  Laterality: Right;  . REVERSE SHOULDER ARTHROPLASTY Left 07/30/2017   Procedure: REVERSE SHOULDER ARTHROPLASTY;  Surgeon: Justice Britain, MD;  Location: [facility];  Service: Orthopedics;  Laterality: Left;  . right knee replacment  08  . SACROILIAC JOINT FUSION Right 12/27/2014   Procedure: RIGHT SACROILIAC JOINT FUSION;  Surgeon: Melina Schools, MD;  Location: [facility];  Service: Orthopedics;  Laterality: Right;  right  sacral fusion  . TONSILLECTOMY    . TOTAL HIP ARTHROPLASTY  09/08/2011   Procedure: TOTAL HIP ARTHROPLASTY;  Surgeon: Gearlean Alf, MD;  [facility];  Service: Orthopedics;  Laterality: Right;  . TOTAL KNEE ARTHROPLASTY Left 02/26/2018   Procedure: LEFT TOTAL KNEE ARTHROPLASTY;  Surgeon: Mcarthur Rossetti, MD;  Location: [facility];  Service: Orthopedics;  Laterality: Left;  . WISDOM TOOTH EXTRACTION      There were no vitals filed for this visit.  Subjective Assessment - 08/26/18 1455    Subjective  RT side hurts now I turned weird    Currently in Pain?  Yes    Pain Score  5     Pain Location  Back    Pain Orientation  Right                       [facility] Adult PT Treatment/Exercise - 08/26/18 0001      Lumbar Exercises: Aerobic   Nustep  level 5 x 6 minutes      Lumbar Exercises: Machines for Strengthening   Cybex Lumbar Extension  black tband 2 sets 10    Cybex Knee Extension  10# 2 sets 10    Cybex Knee Flexion  20# 2  sets10    Other Lumbar Machine Exercise  seated rows 20#, lats 20# 2x15 each    Other Lumbar Machine Exercise  OH wt ball ext 15 times, obl 15 each      Lumbar Exercises: Standing   Other Standing Lumbar Exercises  hip flex,ext and abd green tband 10 each      Lumbar Exercises: Seated   Other Seated Lumbar Exercises  lumb pelvic ROM and stab on sit fit      Manual Therapy   Manual Therapy  Passive ROM    Passive ROM  LE and trunk             PT Education - 08/26/18 1534    Education Details  log roll , lifting and twisting for packing at home    Person(s) Educated  Patient    Methods  Explanation;Demonstration;Verbal cues    Comprehension  Verbalized understanding;Returned demonstration       PT Short Term Goals - 08/17/18 1138      PT SHORT TERM GOAL #1   Title  Ind with initial HEP    Status  Achieved        PT Long Term Goals - 08/03/18 1155      PT LONG TERM GOAL #1   Title  increase ROM of the lumbar  spine 50%    Time  8    Period  Weeks    Status  New      PT LONG TERM GOAL #2   Title  patient report that she can sleep in her own bed most of the night    Time  8    Period  Weeks    Status  New      PT LONG TERM GOAL #3   Title  increase lumbar ROM 25%    Time  8    Period  Weeks    Status  New      PT LONG TERM GOAL #4   Title  understand posture and body mechanics    Time  8    Period  Weeks    Status  New            Plan - 08/26/18 1535    Clinical Impression Statement  pt declined modalitles today. educ on BM and lifting as she is moving and can tell her back hurts more. added core stab on sit fit and tolerated increased resistance with LE ex for hips    PT Treatment/Interventions  ADLs/Self Care Home Management;Cryotherapy;Glass blower/designer;Therapeutic exercise;Gait training;Balance training;Neuromuscular re-education;Patient/family education;Manual techniques;Passive range of motion;Moist Heat;Traction;Ultrasound    PT Next Visit Plan  work on core stability/ hip strengthening and instruct in posture and body mechnaics       Patient will benefit from skilled therapeutic intervention in order to improve the following deficits and impairments:  Abnormal gait, Pain, Decreased scar mobility, Decreased mobility, Decreased strength, Decreased range of motion, Decreased balance, Difficulty walking, Increased edema, Improper body mechanics, Cardiopulmonary status limiting activity, Increased muscle spasms, Postural dysfunction, Impaired flexibility  Visit Diagnosis: 1. Chronic right-sided low back pain with right-sided sciatica   2. Muscle spasm of back        Problem List Patient Active Problem List   Diagnosis Date Noted  . Status post total left knee replacement 02/26/2018  . Unilateral primary osteoarthritis, left knee 02/01/2018  . 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 SI (sacroiliac) pain 12/27/2014  . Postop  Hyponatremia 09/10/2011  .  OA (osteoarthritis) of hip 09/08/2011    PAYSEUR,ANGIE PTA 08/26/2018, 3:37 PM  [facility] [place], [place], [address] Phone: [phone]   Fax:  [phone]  Name: [name] MRN: [id number] Date of Birth: [birth year]

## 2018-08-31 ENCOUNTER — Ambulatory Visit: Payer: Medicare Other | Admitting: Physical Therapy

## 2018-09-02 ENCOUNTER — Ambulatory Visit: Payer: Medicare Other | Admitting: Physical Therapy

## 2018-09-02 ENCOUNTER — Other Ambulatory Visit: Payer: Self-pay

## 2018-09-02 DIAGNOSIS — M5441 Lumbago with sciatica, right side: Secondary | ICD-10-CM | POA: Diagnosis not present

## 2018-09-02 DIAGNOSIS — M62830 Muscle spasm of back: Secondary | ICD-10-CM

## 2018-09-02 DIAGNOSIS — G8929 Other chronic pain: Secondary | ICD-10-CM

## 2018-09-02 DIAGNOSIS — R262 Difficulty in walking, not elsewhere classified: Secondary | ICD-10-CM

## 2018-09-02 NOTE — Therapy (Signed)
[facility], [place], [address] Phone: [phone]   Fax:  [phone]  Physical Therapy Treatment  Patient Details  Name: SUZZANE QUILTER MRN: [id number] Date of Birth: 12-Dec-1945 Referring Provider (PT): Nelva Bush   Encounter Date: 09/02/2018  PT End of Session - 09/02/18 1521    Visit Number  8    Date for PT Re-Evaluation  10/03/18    PT Start Time  1093    PT Stop Time  1540    PT Time Calculation (min)  55 min       Past Medical History:  Diagnosis Date  . Allergic rhinitis   . Anxiety   . Chronic venous insufficiency   . Costochondritis 04/13/2017  . DDD (degenerative disc disease), cervical   . DDD (degenerative disc disease), lumbar   . History of colon polyps   . Hyperlipidemia   . Hypertension   . Hypothyroidism   . Insomnia   . Liver cyst   . Liver nodule   . OA (osteoarthritis)    Hip and knee  . Obese   . Pneumonia    hx  . Restless legs syndrome (RLS)   . Rotator cuff tear arthropathy, right   . Squamous cell carcinoma of left thigh   . Urge incontinence of urine     Past Surgical History:  Procedure Laterality Date  . APPENDECTOMY    . BLADDER SUSPENSION  2011  . COLONOSCOPY    . JOINT REPLACEMENT    . 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 REVERSE SHOULDER ARTHROPLASTY Right 05/14/2017   Procedure: RIGHT REVERSE SHOULDER ARTHROPLASTY;  Surgeon: Justice Britain, MD;  Location: [facility];  Service: Orthopedics;  Laterality: Right;  . REVERSE SHOULDER ARTHROPLASTY Left 07/30/2017   Procedure: REVERSE SHOULDER ARTHROPLASTY;  Surgeon: Justice Britain, MD;  Location: [facility];  Service: Orthopedics;  Laterality: Left;  . right knee replacment  08  . SACROILIAC JOINT FUSION Right 12/27/2014   Procedure: RIGHT SACROILIAC JOINT FUSION;  Surgeon: Melina Schools, MD;  Location: [facility];  Service: Orthopedics;  Laterality: Right;  right  sacral fusion  . TONSILLECTOMY    . TOTAL HIP ARTHROPLASTY  09/08/2011   Procedure: TOTAL HIP ARTHROPLASTY;  Surgeon: Gearlean Alf, MD;  [facility];  Service: Orthopedics;  Laterality: Right;  . TOTAL KNEE ARTHROPLASTY Left 02/26/2018   Procedure: LEFT TOTAL KNEE ARTHROPLASTY;  Surgeon: Mcarthur Rossetti, MD;  [facility];  Service: Orthopedics;  Laterality: Left;  . WISDOM TOOTH EXTRACTION      There were no vitals filed for this visit.  Subjective Assessment - 09/02/18 1449    Subjective  hurting today, just so busy moving and packing- trying to be good    Currently in Pain?  Yes    Pain Score  6     Pain Location  Back    Pain Orientation  Right                       [facility] Adult PT Treatment/Exercise - 09/02/18 0001      Lumbar Exercises: Aerobic   Nustep  level 5 x 6 minutes      Lumbar Exercises: Machines for Strengthening   Cybex Lumbar Extension  black tband 2 sets 10   flex and ext   Cybex Knee Extension  10# 2 sets 10  Cybex Knee Flexion  20# 2 sets10    Other Lumbar Machine Exercise  seated rows 20#, lats 20# 2x15 each    Other Lumbar Machine Exercise  OH wt ball ext 15 times, obl 15 each      Lumbar Exercises: Standing   Other Standing Lumbar Exercises  hip flex,ext and abd green tband 10 each    Other Standing Lumbar Exercises  6 inch step up with opp leg ext 10 each      Moist Heat Therapy   Number Minutes Moist Heat  15 Minutes    Moist Heat Location  Lumbar Spine      Electrical Stimulation   Electrical Stimulation Location  RT LB    Electrical Stimulation Action  IFC    Electrical Stimulation Parameters  supine    Electrical Stimulation Goals  Pain   tightness     Manual Therapy   Manual Therapy  Passive ROM    Passive ROM  LE and trunk               PT Short Term Goals - 08/17/18 1138      PT SHORT TERM GOAL #1   Title  Ind with initial HEP    Status  Achieved        PT Long Term Goals -  08/03/18 1155      PT LONG TERM GOAL #1   Title  increase ROM of the lumbar spine 50%    Time  8    Period  Weeks    Status  New      PT LONG TERM GOAL #2   Title  patient report that she can sleep in her own bed most of the night    Time  8    Period  Weeks    Status  New      PT LONG TERM GOAL #3   Title  increase lumbar ROM 25%    Time  8    Period  Weeks    Status  New      PT LONG TERM GOAL #4   Title  understand posture and body mechanics    Time  8    Period  Weeks    Status  New            Plan - 09/02/18 1521    Clinical Impression Statement  progressing toward goals. more painful today but tolerated interventions well. Pt is in the middle of moving so this is causing increased irritation. continuing to reinforce posture and BM during session.    PT Next Visit Plan  work on core stability/ hip strengthening and instruct in posture and body mechnaics       Patient will benefit from skilled therapeutic intervention in order to improve the following deficits and impairments:  Abnormal gait, Pain, Decreased scar mobility, Decreased mobility, Decreased strength, Decreased range of motion, Decreased balance, Difficulty walking, Increased edema, Improper body mechanics, Cardiopulmonary status limiting activity, Increased muscle spasms, Postural dysfunction, Impaired flexibility  Visit Diagnosis: 1. Muscle spasm of back   2. Chronic right-sided low back pain with right-sided sciatica   3. Difficulty in walking, not elsewhere classified        Problem List Patient Active Problem List   Diagnosis Date Noted  . Status post total left knee replacement 02/26/2018  . Unilateral primary osteoarthritis, left knee 02/01/2018  . 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 SI (sacroiliac) pain 12/27/2014  . Postop Hyponatremia  09/10/2011  . OA (osteoarthritis) of hip 09/08/2011    ,ANGIE PTA 09/02/2018, 3:23 PM  [place], [place], [address] Phone: [phone]   Fax:  [phone]  Name: NIXON SPARR MRN: [id number] Date of Birth: [birth year]

## 2018-09-07 ENCOUNTER — Other Ambulatory Visit: Payer: Self-pay

## 2018-09-07 ENCOUNTER — Ambulatory Visit: Payer: Medicare Other | Admitting: Physical Therapy

## 2018-09-07 DIAGNOSIS — M62830 Muscle spasm of back: Secondary | ICD-10-CM

## 2018-09-07 DIAGNOSIS — R262 Difficulty in walking, not elsewhere classified: Secondary | ICD-10-CM

## 2018-09-07 DIAGNOSIS — G8929 Other chronic pain: Secondary | ICD-10-CM

## 2018-09-07 DIAGNOSIS — M5441 Lumbago with sciatica, right side: Secondary | ICD-10-CM | POA: Diagnosis not present

## 2018-09-07 NOTE — Therapy (Signed)
[facility] [place], [place], [address] Phone: [phone]   Fax:  [phone]  Physical Therapy Treatment  Patient Details  Name: [name] MRN: [id number] Date of Birth: [birth year] Referring Provider (PT): Nelva Bush   Encounter Date: 09/07/2018  PT End of Session - 09/07/18 1053    Visit Number  9    Date for PT Re-Evaluation  10/03/18    PT Start Time  1017    PT Stop Time  1055    PT Time Calculation (min)  38 min       Past Medical History:  Diagnosis Date  . Allergic rhinitis   . Anxiety   . Chronic venous insufficiency   . Costochondritis 04/13/2017  . DDD (degenerative disc disease), cervical   . DDD (degenerative disc disease), lumbar   . History of colon polyps   . Hyperlipidemia   . Hypertension   . Hypothyroidism   . Insomnia   . Liver cyst   . Liver nodule   . OA (osteoarthritis)    Hip and knee  . Obese   . Pneumonia    hx  . Restless legs syndrome (RLS)   . Rotator cuff tear arthropathy, right   . Squamous cell carcinoma of left thigh   . Urge incontinence of urine     Past Surgical History:  Procedure Laterality Date  . APPENDECTOMY    . BLADDER SUSPENSION  2011  . COLONOSCOPY    . JOINT REPLACEMENT    . 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 REVERSE SHOULDER ARTHROPLASTY Right 05/14/2017   Procedure: RIGHT REVERSE SHOULDER ARTHROPLASTY;  Surgeon: Justice Britain, MD;  Location: [facility];  Service: Orthopedics;  Laterality: Right;  . REVERSE SHOULDER ARTHROPLASTY Left 07/30/2017   Procedure: REVERSE SHOULDER ARTHROPLASTY;  Surgeon: Justice Britain, MD;  Location: [facility];  Service: Orthopedics;  Laterality: Left;  . right knee replacment  08  . SACROILIAC JOINT FUSION Right 12/27/2014   Procedure: RIGHT SACROILIAC JOINT FUSION;  Surgeon: Melina Schools, MD;  Location: [facility];  Service: Orthopedics;  Laterality: Right;  right  sacral fusion  . TONSILLECTOMY    . TOTAL HIP ARTHROPLASTY  09/08/2011   Procedure: TOTAL HIP ARTHROPLASTY;  Surgeon: Gearlean Alf, MD;  [facility];  Service: Orthopedics;  Laterality: Right;  . TOTAL KNEE ARTHROPLASTY Left 02/26/2018   Procedure: LEFT TOTAL KNEE ARTHROPLASTY;  Surgeon: Mcarthur Rossetti, MD;  [facility];  Service: Orthopedics;  Laterality: Left;  . WISDOM TOOTH EXTRACTION      There were no vitals filed for this visit.  Subjective Assessment - 09/07/18 1018    Subjective  I think we need to up wt on machines. pain moves around but always there and intensity varies    Currently in Pain?  Yes    Pain Score  3     Pain Location  Back    Pain Orientation  Mid                       [facility] Adult PT Treatment/Exercise - 09/07/18 0001      Lumbar Exercises: Aerobic   Nustep  L 6 6 min      Lumbar Exercises: Machines for Strengthening   Cybex Lumbar Extension  black band flex and ext 25x    Cybex Knee Extension  15# 2  sets 10    Cybex Knee Flexion  25# 2 sets 10    Other Lumbar Machine Exercise  seated rows 25#, lats 25# 2x10each    Other Lumbar Machine Exercise  OH wt ball ext 15 times, obl 15 each      Lumbar Exercises: Standing   Heel Raises  15 reps   black bar   Other Standing Lumbar Exercises  resisted gait fwd    Other Standing Lumbar Exercises  6 inch step up with opp leg ext 10 each   6 inch lat step up with opp leg abd     Manual Therapy   Manual Therapy  Passive ROM    Passive ROM  LE and trunk               PT Short Term Goals - 08/17/18 1138      PT SHORT TERM GOAL #1   Title  Ind with initial HEP    Status  Achieved        PT Long Term Goals - 08/03/18 1155      PT LONG TERM GOAL #1   Title  increase ROM of the lumbar spine 50%    Time  8    Period  Weeks    Status  New      PT LONG TERM GOAL #2   Title  patient report that she can sleep in her own bed most of the night    Time  8     Period  Weeks    Status  New      PT LONG TERM GOAL #3   Title  increase lumbar ROM 25%    Time  8    Period  Weeks    Status  New      PT LONG TERM GOAL #4   Title  understand posture and body mechanics    Time  8    Period  Weeks    Status  New            Plan - 09/07/18 1053    Clinical Impression Statement  increased all weights today at pt request and sh estated it felt good. pt still needs some cuing with ex to enage core. pt continues to have weakness with lat mvmt and hip abd.    PT Treatment/Interventions  ADLs/Self Care Home Management;Cryotherapy;Glass blower/designer;Therapeutic exercise;Gait training;Balance training;Neuromuscular re-education;Patient/family education;Manual techniques;Passive range of motion;Moist Heat;Traction;Ultrasound    PT Next Visit Plan  assess and progress after increased wts       Patient will benefit from skilled therapeutic intervention in order to improve the following deficits and impairments:  Abnormal gait, Pain, Decreased scar mobility, Decreased mobility, Decreased strength, Decreased range of motion, Decreased balance, Difficulty walking, Increased edema, Improper body mechanics, Cardiopulmonary status limiting activity, Increased muscle spasms, Postural dysfunction, Impaired flexibility  Visit Diagnosis: 1. Muscle spasm of back   2. Chronic right-sided low back pain with right-sided sciatica   3. Difficulty in walking, not elsewhere classified        Problem List Patient Active Problem List   Diagnosis Date Noted  . Status post total left knee replacement 02/26/2018  . Unilateral primary osteoarthritis, left knee 02/01/2018  . 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 SI (sacroiliac) pain 12/27/2014  . Postop Hyponatremia 09/10/2011  . OA (osteoarthritis) of hip 09/08/2011    Geralda Baumgardner,ANGIE PTA 09/07/2018, 10:55 AM  [facility]  [place], [place], [address] Phone: [phone]   Fax:  [phone]  Name: HAYDE KILGOUR MRN: [id number] Date of Birth: 08-16-1945

## 2018-09-09 ENCOUNTER — Ambulatory Visit: Payer: Medicare Other | Admitting: Physical Therapy

## 2018-09-09 ENCOUNTER — Other Ambulatory Visit: Payer: Self-pay

## 2018-09-09 DIAGNOSIS — R262 Difficulty in walking, not elsewhere classified: Secondary | ICD-10-CM

## 2018-09-09 DIAGNOSIS — M62830 Muscle spasm of back: Secondary | ICD-10-CM

## 2018-09-09 DIAGNOSIS — G8929 Other chronic pain: Secondary | ICD-10-CM

## 2018-09-09 DIAGNOSIS — M5441 Lumbago with sciatica, right side: Secondary | ICD-10-CM | POA: Diagnosis not present

## 2018-09-09 NOTE — Therapy (Signed)
[place], [place], [address] Phone: [phone]   Fax:  [phone] Progress Note Reporting Period 08/03/18 to 09/09/18 for the first 10 visits  See note below for Objective Data and Assessment of Progress/Goals.      Physical Therapy Treatment  Patient Details  Name: [name] MRN: [id number] Date of Birth: 10/09/1945 Referring Provider (PT): Nelva Bush   Encounter Date: 09/09/2018  PT End of Session - 09/09/18 7048    Visit Number  10    Date for PT Re-Evaluation  10/03/18    PT Start Time  1400    PT Stop Time  1452    PT Time Calculation (min)  52 min       Past Medical History:  Diagnosis Date  . Allergic rhinitis   . Anxiety   . Chronic venous insufficiency   . Costochondritis 04/13/2017  . DDD (degenerative disc disease), cervical   . DDD (degenerative disc disease), lumbar   . History of colon polyps   . Hyperlipidemia   . Hypertension   . Hypothyroidism   . Insomnia   . Liver cyst   . Liver nodule   . OA (osteoarthritis)    Hip and knee  . Obese   . Pneumonia    hx  . Restless legs syndrome (RLS)   . Rotator cuff tear arthropathy, right   . Squamous cell carcinoma of left thigh   . Urge incontinence of urine     Past Surgical History:  Procedure Laterality Date  . APPENDECTOMY    . BLADDER SUSPENSION  2011  . COLONOSCOPY    . JOINT REPLACEMENT    . 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 REVERSE SHOULDER ARTHROPLASTY Right 05/14/2017   Procedure: RIGHT REVERSE SHOULDER ARTHROPLASTY;  Surgeon: Justice Britain, MD;  Location: [facility];  Service: Orthopedics;  Laterality: Right;  . REVERSE SHOULDER ARTHROPLASTY Left 07/30/2017   Procedure: REVERSE SHOULDER ARTHROPLASTY;  Surgeon: Justice Britain, MD;  Location: [facility];  Service: Orthopedics;  Laterality: Left;  . right knee replacment  08  . SACROILIAC JOINT FUSION Right  12/27/2014   Procedure: RIGHT SACROILIAC JOINT FUSION;  Surgeon: Melina Schools, MD;  Location: [facility];  Service: Orthopedics;  Laterality: Right;  right sacral fusion  . TONSILLECTOMY    . TOTAL HIP ARTHROPLASTY  09/08/2011   Procedure: TOTAL HIP ARTHROPLASTY;  Surgeon: Gearlean Alf, MD;  [facility];  Service: Orthopedics;  Laterality: Right;  . TOTAL KNEE ARTHROPLASTY Left 02/26/2018   Procedure: LEFT TOTAL KNEE ARTHROPLASTY;  Surgeon: Mcarthur Rossetti, MD;  [facility];  Service: Orthopedics;  Laterality: Left;  . WISDOM TOOTH EXTRACTION      There were no vitals filed for this visit.  Subjective Assessment - 09/09/18 1401    Subjective  felt great after last session, I want the same    Currently in Pain?  Yes    Pain Score  2     Pain Location  Back                       [facility] Adult PT Treatment/Exercise - 09/09/18 0001      Lumbar Exercises: Aerobic   Nustep  L 6 6 min      Lumbar Exercises: Machines for Strengthening   Cybex Lumbar Extension  black band flex and  ext 25x    Cybex Knee Extension  15# 2 sets 10    Cybex Knee Flexion  25# 2 sets 10    Other Lumbar Machine Exercise  seated rows 25#, lats 25# 2x10each    Other Lumbar Machine Exercise  OH wt ball ext 15 times, obl 15 each      Lumbar Exercises: Standing   Heel Raises  15 reps   black bar   Other Standing Lumbar Exercises  alt leg 20 reps marching, hip flex,hip abd and hip ext    Other Standing Lumbar Exercises  6 inch step up with opp leg ext 10 each   6 in lat step up opp leg abd     Modalities   Modalities  Iontophoresis      Iontophoresis   Type of Iontophoresis  Dexamethasone    Location  RT GT    Dose  1.1 cc dex    Time  80 mA 4 hour patch      Manual Therapy   Manual Therapy  Passive ROM    Passive ROM  LE and trunk               PT Short Term Goals - 08/17/18 1138      PT SHORT TERM GOAL #1   Title  Ind with initial HEP    Status  Achieved         PT Long Term Goals - 09/09/18 1426      PT LONG TERM GOAL #1   Title  increase ROM of the lumbar spine 50%      PT LONG TERM GOAL #2   Title  patient report that she can sleep in her own bed most of the night    Status  Partially Met      PT LONG TERM GOAL #3   Title  increase lumbar ROM 25%    Status  Achieved      PT LONG TERM GOAL #4   Title  understand posture and body mechanics    Status  Partially Met            Plan - 09/09/18 1437    Clinical Impression Statement  progressing with goals, very tender RT GT ( added ionto today to see if helps) . pain with sleeping but ROM is improving. pt working on good BM which has been a challenge d/t packing and moving    PT Treatment/Interventions  ADLs/Self Care Home Management;Cryotherapy;Glass blower/designer;Therapeutic exercise;Gait training;Balance training;Neuromuscular re-education;Patient/family education;Manual techniques;Passive range of motion;Moist Heat;Traction;Ultrasound    PT Next Visit Plan  assess ionto and progress strength      Adding ionto to plan  Patient will benefit from skilled therapeutic intervention in order to improve the following deficits and impairments:  Abnormal gait, Pain, Decreased scar mobility, Decreased mobility, Decreased strength, Decreased range of motion, Decreased balance, Difficulty walking, Increased edema, Improper body mechanics, Cardiopulmonary status limiting activity, Increased muscle spasms, Postural dysfunction, Impaired flexibility  Visit Diagnosis: 1. Chronic right-sided low back pain with right-sided sciatica   2. Difficulty in walking, not elsewhere classified   3. Muscle spasm of back        Problem List Patient Active Problem List   Diagnosis Date Noted  . Status post total left knee replacement 02/26/2018  . Unilateral primary osteoarthritis, left knee 02/01/2018  . 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 SI (sacroiliac) pain  12/27/2014  . Postop Hyponatremia 09/10/2011  . OA (osteoarthritis) of  hip 09/08/2011    PAYSEUR,ANGIE PTA 09/09/2018, 2:40 PM  [place], [place], [address] Phone: [phone]   Fax:  [phone]  Name: [name] MRN: [id number] Date of Birth: 10-06-1945

## 2018-09-14 ENCOUNTER — Ambulatory Visit: Payer: Medicare Other | Admitting: Physical Therapy

## 2018-09-14 ENCOUNTER — Other Ambulatory Visit: Payer: Self-pay

## 2018-09-14 DIAGNOSIS — M62830 Muscle spasm of back: Secondary | ICD-10-CM

## 2018-09-14 DIAGNOSIS — M5441 Lumbago with sciatica, right side: Secondary | ICD-10-CM | POA: Diagnosis not present

## 2018-09-14 DIAGNOSIS — R262 Difficulty in walking, not elsewhere classified: Secondary | ICD-10-CM

## 2018-09-14 DIAGNOSIS — G8929 Other chronic pain: Secondary | ICD-10-CM

## 2018-09-14 NOTE — Therapy (Signed)
[place], [place], [address] Phone: [phone]   Fax:  [phone]  Physical Therapy Treatment  Patient Details  Name: [name] MRN: [id number] Date of Birth: 08-Aug-1945 Referring Provider (PT): Nelva Bush   Encounter Date: 09/14/2018  PT End of Session - 09/14/18 1009    Visit Number  11    Date for PT Re-Evaluation  10/03/18    PT Start Time  0930    PT Stop Time  1025    PT Time Calculation (min)  55 min       Past Medical History:  Diagnosis Date  . Allergic rhinitis   . Anxiety   . Chronic venous insufficiency   . Costochondritis 04/13/2017  . DDD (degenerative disc disease), cervical   . DDD (degenerative disc disease), lumbar   . History of colon polyps   . Hyperlipidemia   . Hypertension   . Hypothyroidism   . Insomnia   . Liver cyst   . Liver nodule   . OA (osteoarthritis)    Hip and knee  . Obese   . Pneumonia    hx  . Restless legs syndrome (RLS)   . Rotator cuff tear arthropathy, right   . Squamous cell carcinoma of left thigh   . Urge incontinence of urine     Past Surgical History:  Procedure Laterality Date  . APPENDECTOMY    . BLADDER SUSPENSION  2011  . COLONOSCOPY    . JOINT REPLACEMENT    . 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 REVERSE SHOULDER ARTHROPLASTY Right 05/14/2017   Procedure: RIGHT REVERSE SHOULDER ARTHROPLASTY;  Surgeon: Justice Britain, MD;  Location: [facility];  Service: Orthopedics;  Laterality: Right;  . REVERSE SHOULDER ARTHROPLASTY Left 07/30/2017   Procedure: REVERSE SHOULDER ARTHROPLASTY;  Surgeon: Justice Britain, MD;  Location: [facility];  Service: Orthopedics;  Laterality: Left;  . right knee replacment  08  . SACROILIAC JOINT FUSION Right 12/27/2014   Procedure: RIGHT SACROILIAC JOINT FUSION;  Surgeon: Melina Schools, MD;  Location: [facility];  Service: Orthopedics;  Laterality: Right;  right  sacral fusion  . TONSILLECTOMY    . TOTAL HIP ARTHROPLASTY  09/08/2011   Procedure: TOTAL HIP ARTHROPLASTY;  Surgeon: Gearlean Alf, MD;  Location: [facility];  Service: Orthopedics;  Laterality: Right;  . TOTAL KNEE ARTHROPLASTY Left 02/26/2018   Procedure: LEFT TOTAL KNEE ARTHROPLASTY;  Surgeon: Mcarthur Rossetti, MD;  Location[facility];  Service: Orthopedics;  Laterality: Left;  . WISDOM TOOTH EXTRACTION      There were no vitals filed for this visit.  Subjective Assessment - 09/14/18 0936    Subjective  packed and moved kitchen stuff yesterday, upper back achey. Lower back comes and goes. patch really helped hip    Currently in Pain?  Yes    Pain Score  6     Pain Location  Back    Pain Orientation  Upper                       [facility] Adult PT Treatment/Exercise - 09/14/18 0001      Lumbar Exercises: Aerobic   Nustep  L 6 6 min      Lumbar Exercises: Machines for Strengthening   Cybex Lumbar Extension  black band flex and ext 25x    Cybex Knee Extension  15# 3  sets 10    Cybex Knee Flexion  25# 3 sets 10    Other Lumbar Machine Exercise  seated rows 25#, lats 25# 2x15each    Other Lumbar Machine Exercise  OH wt ball ext 15 times, obl 15 each      Lumbar Exercises: Standing   Heel Raises  15 reps   black bar   Other Standing Lumbar Exercises  resisted gait fwd 5 x and each side 3 each   30#   Other Standing Lumbar Exercises  6 inch step up with opp leg ext 10 each   6 in step up lat opp leg abd 10 each     Lumbar Exercises: Seated   Sit to Stand  10 reps   no UE , wt ball     Moist Heat Therapy   Number Minutes Moist Heat  15 Minutes    Moist Heat Location  Lumbar Spine      Electrical Stimulation   Electrical Stimulation Location  mid/LB    Electrical Stimulation Action  premod    Electrical Stimulation Parameters  supine    Electrical Stimulation Goals  Pain      Iontophoresis   Type of Iontophoresis  Dexamethasone    Location  RT GT    Dose   1.1 cc dex    Time  80 mA 4 hour patch      Manual Therapy   Manual Therapy  --    Manual therapy comments  --    Passive ROM  LE and trunk               PT Short Term Goals - 08/17/18 1138      PT SHORT TERM GOAL #1   Title  Ind with initial HEP    Status  Achieved        PT Long Term Goals - 09/09/18 1426      PT LONG TERM GOAL #1   Title  increase ROM of the lumbar spine 50%      PT LONG TERM GOAL #2   Title  patient report that she can sleep in her own bed most of the night    Status  Partially Met      PT LONG TERM GOAL #3   Title  increase lumbar ROM 25%    Status  Achieved      PT LONG TERM GOAL #4   Title  understand posture and body mechanics    Status  Partially Met            Plan - 09/14/18 1009    Clinical Impression Statement  responded well to ionto but still very tender over GT. increased ex and reps today. some upper back increased soreness d/t moving and lifting- reviewed BM and placing boxes over higher surface.    PT Treatment/Interventions  ADLs/Self Care Home Management;Cryotherapy;Glass blower/designer;Therapeutic exercise;Gait training;Balance training;Neuromuscular re-education;Patient/family education;Manual techniques;Passive range of motion;Moist Heat;Traction;Ultrasound    PT Next Visit Plan  progress strength and prepare for independant ex program       Patient will benefit from skilled therapeutic intervention in order to improve the following deficits and impairments:  Abnormal gait, Pain, Decreased scar mobility, Decreased mobility, Decreased strength, Decreased range of motion, Decreased balance, Difficulty walking, Increased edema, Improper body mechanics, Cardiopulmonary status limiting activity, Increased muscle spasms, Postural dysfunction, Impaired flexibility  Visit Diagnosis: 1. Difficulty in walking, not elsewhere classified   2. Chronic right-sided low back pain with right-sided  sciatica   3.  Muscle spasm of back        Problem List Patient Active Problem List   Diagnosis Date Noted  . Status post total left knee replacement 02/26/2018  . Unilateral primary osteoarthritis, left knee 02/01/2018  . 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 SI (sacroiliac) pain 12/27/2014  . Postop Hyponatremia 09/10/2011  . OA (osteoarthritis) of hip 09/08/2011    PAYSEUR,ANGIE  PTA 09/14/2018, 10:11 AM  [place], [place], [address] Phone: [phone]   Fax:  [phone]  Name: AMARII AMY MRN: [id number] Date of Birth: 04/22/1945

## 2018-09-16 ENCOUNTER — Ambulatory Visit: Payer: Medicare Other | Admitting: Physical Therapy

## 2018-09-21 ENCOUNTER — Other Ambulatory Visit: Payer: Self-pay

## 2018-09-21 ENCOUNTER — Ambulatory Visit: Payer: Medicare Other | Attending: Physical Medicine and Rehabilitation | Admitting: Physical Therapy

## 2018-09-21 DIAGNOSIS — M5441 Lumbago with sciatica, right side: Secondary | ICD-10-CM | POA: Diagnosis present

## 2018-09-21 DIAGNOSIS — G8929 Other chronic pain: Secondary | ICD-10-CM | POA: Diagnosis present

## 2018-09-21 DIAGNOSIS — R262 Difficulty in walking, not elsewhere classified: Secondary | ICD-10-CM | POA: Diagnosis present

## 2018-09-21 DIAGNOSIS — M62830 Muscle spasm of back: Secondary | ICD-10-CM | POA: Insufficient documentation

## 2018-09-21 NOTE — Therapy (Signed)
[place], [place], [address] Phone: [phone]   Fax:  [phone]  Physical Therapy Treatment  Patient Details  Name: [name] MRN: [id number] Date of Birth: 1945/10/28 Referring Provider (PT): Nelva Bush   Encounter Date: 09/21/2018  PT End of Session - 09/21/18 1136    Visit Number  12    Date for PT Re-Evaluation  10/03/18    PT Start Time  1100    PT Stop Time  1140    PT Time Calculation (min)  40 min       Past Medical History:  Diagnosis Date  . Allergic rhinitis   . Anxiety   . Chronic venous insufficiency   . Costochondritis 04/13/2017  . DDD (degenerative disc disease), cervical   . DDD (degenerative disc disease), lumbar   . History of colon polyps   . Hyperlipidemia   . Hypertension   . Hypothyroidism   . Insomnia   . Liver cyst   . Liver nodule   . OA (osteoarthritis)    Hip and knee  . Obese   . Pneumonia    hx  . Restless legs syndrome (RLS)   . Rotator cuff tear arthropathy, right   . Squamous cell carcinoma of left thigh   . Urge incontinence of urine     Past Surgical History:  Procedure Laterality Date  . APPENDECTOMY    . BLADDER SUSPENSION  2011  . COLONOSCOPY    . JOINT REPLACEMENT    . 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 REVERSE SHOULDER ARTHROPLASTY Right 05/14/2017   Procedure: RIGHT REVERSE SHOULDER ARTHROPLASTY;  Surgeon: Justice Britain, MD;  Location: [facility];  Service: Orthopedics;  Laterality: Right;  . REVERSE SHOULDER ARTHROPLASTY Left 07/30/2017   Procedure: REVERSE SHOULDER ARTHROPLASTY;  Surgeon: Justice Britain, MD;  Location: [facility];  Service: Orthopedics;  Laterality: Left;  . right knee replacment  08  . SACROILIAC JOINT FUSION Right 12/27/2014   Procedure: RIGHT SACROILIAC JOINT FUSION;  Surgeon: Melina Schools, MD;  Location: [facility];  Service: Orthopedics;  Laterality: Right;  right  sacral fusion  . TONSILLECTOMY    . TOTAL HIP ARTHROPLASTY  09/08/2011   Procedure: TOTAL HIP ARTHROPLASTY;  Surgeon: Gearlean Alf, MD;  [facility];  Service: Orthopedics;  Laterality: Right;  . TOTAL KNEE ARTHROPLASTY Left 02/26/2018   Procedure: LEFT TOTAL KNEE ARTHROPLASTY;  Surgeon: Mcarthur Rossetti, MD;  [facility];  Service: Orthopedics;  Laterality: Left;  . WISDOM TOOTH EXTRACTION      There were no vitals filed for this visit.  Subjective Assessment - 09/21/18 1104    Subjective  survived move- back comes and goes with pain    Currently in Pain?  No/denies                       [facility] Adult PT Treatment/Exercise - 09/21/18 0001      Lumbar Exercises: Aerobic   Nustep  L 6 6 min      Lumbar Exercises: Machines for Strengthening   Cybex Lumbar Extension  black band flex and ext 25x    Cybex Knee Extension  20# 2 sets 10    Cybex Knee Flexion  35# 2 sets 10    Leg Press  40# 2 sets 15   calf raises 40# 2 sets 15  Other Lumbar Machine Exercise  seated rows 35#, lats 35# 2x10each      Lumbar Exercises: Standing   Other Standing Lumbar Exercises  resisted gait fwd 5 x and each side 3 each    Other Standing Lumbar Exercises  pulleys 15# retraction 10# shld ext      Lumbar Exercises: Seated   Sit to Stand  20 reps   from mat with wt ball              PT Short Term Goals - 08/17/18 1138      PT SHORT TERM GOAL #1   Title  Ind with initial HEP    Status  Achieved        PT Long Term Goals - 09/09/18 1426      PT LONG TERM GOAL #1   Title  increase ROM of the lumbar spine 50%      PT LONG TERM GOAL #2   Title  patient report that she can sleep in her own bed most of the night    Status  Partially Met      PT LONG TERM GOAL #3   Title  increase lumbar ROM 25%    Status  Achieved      PT LONG TERM GOAL #4   Title  understand posture and body mechanics    Status  Partially Met            Plan - 09/21/18  1141    Clinical Impression Statement  increased all wts today and ex at pt request, cuing for BM, speed and control. no pain after an dtrying to prepare for safe transition to independant gym program    PT Treatment/Interventions  ADLs/Self Care Home Management;Cryotherapy;Glass blower/designer;Therapeutic exercise;Gait training;Balance training;Neuromuscular re-education;Patient/family education;Manual techniques;Passive range of motion;Moist Heat;Traction;Ultrasound    PT Next Visit Plan  progress strength and prepare for independant ex program       Patient will benefit from skilled therapeutic intervention in order to improve the following deficits and impairments:  Abnormal gait, Pain, Decreased scar mobility, Decreased mobility, Decreased strength, Decreased range of motion, Decreased balance, Difficulty walking, Increased edema, Improper body mechanics, Cardiopulmonary status limiting activity, Increased muscle spasms, Postural dysfunction, Impaired flexibility  Visit Diagnosis: 1. Difficulty in walking, not elsewhere classified   2. Chronic right-sided low back pain with right-sided sciatica        Problem List Patient Active Problem List   Diagnosis Date Noted  . Status post total left knee replacement 02/26/2018  . Unilateral primary osteoarthritis, left knee 02/01/2018  . 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 SI (sacroiliac) pain 12/27/2014  . Postop Hyponatremia 09/10/2011  . OA (osteoarthritis) of hip 09/08/2011    PAYSEUR,ANGIE PTA 09/21/2018, 11:44 AM  [place], [place], [address] Phone: [phone]   Fax:  [phone]  Name: [name] MRN: [id number] Date of Birth: 09-Jan-1946

## 2018-09-23 ENCOUNTER — Ambulatory Visit: Payer: Medicare Other | Admitting: Physical Therapy

## 2018-09-23 ENCOUNTER — Other Ambulatory Visit: Payer: Self-pay

## 2018-09-23 DIAGNOSIS — M62830 Muscle spasm of back: Secondary | ICD-10-CM

## 2018-09-23 DIAGNOSIS — R262 Difficulty in walking, not elsewhere classified: Secondary | ICD-10-CM | POA: Diagnosis not present

## 2018-09-23 DIAGNOSIS — G8929 Other chronic pain: Secondary | ICD-10-CM

## 2018-09-23 DIAGNOSIS — M5441 Lumbago with sciatica, right side: Secondary | ICD-10-CM

## 2018-09-23 NOTE — Therapy (Signed)
[place], [place], [address] Phone: [phone]   Fax:  [phone]  Physical Therapy Treatment  Patient Details  Name: [name] MRN: [id number] Date of Birth: 1945/07/01 Referring Provider (PT): Nelva Bush   Encounter Date: 09/23/2018  PT End of Session - 09/23/18 1225    Visit Number  13    Date for PT Re-Evaluation  10/03/18    PT Start Time  1152    PT Stop Time  1230    PT Time Calculation (min)  38 min       Past Medical History:  Diagnosis Date  . Allergic rhinitis   . Anxiety   . Chronic venous insufficiency   . Costochondritis 04/13/2017  . DDD (degenerative disc disease), cervical   . DDD (degenerative disc disease), lumbar   . History of colon polyps   . Hyperlipidemia   . Hypertension   . Hypothyroidism   . Insomnia   . Liver cyst   . Liver nodule   . OA (osteoarthritis)    Hip and knee  . Obese   . Pneumonia    hx  . Restless legs syndrome (RLS)   . Rotator cuff tear arthropathy, right   . Squamous cell carcinoma of left thigh   . Urge incontinence of urine     Past Surgical History:  Procedure Laterality Date  . APPENDECTOMY    . BLADDER SUSPENSION  2011  . COLONOSCOPY    . JOINT REPLACEMENT    . 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 REVERSE SHOULDER ARTHROPLASTY Right 05/14/2017   Procedure: RIGHT REVERSE SHOULDER ARTHROPLASTY;  Surgeon: Justice Britain, MD;  Location: [facility];  Service: Orthopedics;  Laterality: Right;  . REVERSE SHOULDER ARTHROPLASTY Left 07/30/2017   Procedure: REVERSE SHOULDER ARTHROPLASTY;  Surgeon: Justice Britain, MD;  Location: [facility];  Service: Orthopedics;  Laterality: Left;  . right knee replacment  08  . SACROILIAC JOINT FUSION Right 12/27/2014   Procedure: RIGHT SACROILIAC JOINT FUSION;  Surgeon: Melina Schools, MD;  Location: [facility];  Service: Orthopedics;  Laterality: Right;  right  sacral fusion  . TONSILLECTOMY    . TOTAL HIP ARTHROPLASTY  09/08/2011   Procedure: TOTAL HIP ARTHROPLASTY;  Surgeon: Gearlean Alf, MD;  [facility];  Service: Orthopedics;  Laterality: Right;  . TOTAL KNEE ARTHROPLASTY Left 02/26/2018   Procedure: LEFT TOTAL KNEE ARTHROPLASTY;  Surgeon: Mcarthur Rossetti, MD;  [facility];  Service: Orthopedics;  Laterality: Left;  . WISDOM TOOTH EXTRACTION      There were no vitals filed for this visit.  Subjective Assessment - 09/23/18 1153    Subjective  7 min late. doing well excpet sleeping is still an issue. sleep 2-3 hours then have to get on couch. did well with increased weight last session. shopped 2 hours without pain- happy with improvement    Currently in Pain?  Yes    Pain Score  2     Pain Location  Back                       [facility] Adult PT Treatment/Exercise - 09/23/18 0001      Lumbar Exercises: Aerobic   Nustep  L 6 6 min      Lumbar Exercises: Machines for Strengthening   Cybex Lumbar Extension  black band flex and ext  25x    Cybex Knee Extension  20# 3 sets 10    Cybex Knee Flexion  35# 3 sets 10    Leg Press  40# 2 sets 15    Other Lumbar Machine Exercise  seated rows 35#, lats 35# 2x15each   calf raises 40# 2 sets 15   Other Lumbar Machine Exercise  OH wt ball ext 15 times, obl 15 each      Lumbar Exercises: Standing   Other Standing Lumbar Exercises  4# standing dead lifts 2 sets 10    Other Standing Lumbar Exercises  hip 4 way 12 each               PT Short Term Goals - 08/17/18 1138      PT SHORT TERM GOAL #1   Title  Ind with initial HEP    Status  Achieved        PT Long Term Goals - 09/09/18 1426      PT LONG TERM GOAL #1   Title  increase ROM of the lumbar spine 50%      PT LONG TERM GOAL #2   Title  patient report that she can sleep in her own bed most of the night    Status  Partially Met      PT LONG TERM GOAL #3   Title  increase lumbar ROM 25%     Status  Achieved      PT LONG TERM GOAL #4   Title  understand posture and body mechanics    Status  Partially Met            Plan - 09/23/18 1225    Clinical Impression Statement  tolerating increased wt and reps and doing very well. issues with sleeping but overall much better    PT Treatment/Interventions  ADLs/Self Care Home Management;Cryotherapy;Glass blower/designer;Therapeutic exercise;Gait training;Balance training;Neuromuscular re-education;Patient/family education;Manual techniques;Passive range of motion;Moist Heat;Traction;Ultrasound    PT Next Visit Plan  progress strength and prepare for independant ex program       Patient will benefit from skilled therapeutic intervention in order to improve the following deficits and impairments:  Abnormal gait, Pain, Decreased scar mobility, Decreased mobility, Decreased strength, Decreased range of motion, Decreased balance, Difficulty walking, Increased edema, Improper body mechanics, Cardiopulmonary status limiting activity, Increased muscle spasms, Postural dysfunction, Impaired flexibility  Visit Diagnosis: 1. Difficulty in walking, not elsewhere classified   2. Chronic right-sided low back pain with right-sided sciatica   3. Muscle spasm of back        Problem List Patient Active Problem List   Diagnosis Date Noted  . Status post total left knee replacement 02/26/2018  . Unilateral primary osteoarthritis, left knee 02/01/2018  . 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 SI (sacroiliac) pain 12/27/2014  . Postop Hyponatremia 09/10/2011  . OA (osteoarthritis) of hip 09/08/2011    Johm Pfannenstiel,ANGIE PTA 09/23/2018, 12:26 PM  [place], [place], [address] Phone: [phone]   Fax:  [phone]  Name: YOANA STAIB MRN: [id number] Date of Birth: [birth year]

## 2018-09-28 ENCOUNTER — Other Ambulatory Visit: Payer: Self-pay

## 2018-09-28 ENCOUNTER — Ambulatory Visit: Payer: Medicare Other | Admitting: Physical Therapy

## 2018-09-28 ENCOUNTER — Encounter: Payer: Self-pay | Admitting: Physical Therapy

## 2018-09-28 DIAGNOSIS — R262 Difficulty in walking, not elsewhere classified: Secondary | ICD-10-CM

## 2018-09-28 DIAGNOSIS — M62830 Muscle spasm of back: Secondary | ICD-10-CM

## 2018-09-28 DIAGNOSIS — G8929 Other chronic pain: Secondary | ICD-10-CM

## 2018-09-28 DIAGNOSIS — M5441 Lumbago with sciatica, right side: Secondary | ICD-10-CM

## 2018-09-28 NOTE — Therapy (Signed)
[place], [place], [address] Phone: [phone]   Fax:  [phone]  Physical Therapy Treatment  Patient Details  Name: [name] MRN: [id number] Date of Birth: [birth year] Referring Provider (PT): Nelva Bush   Encounter Date: 09/28/2018  PT End of Session - 09/28/18 1334    Visit Number  14    Date for PT Re-Evaluation  10/03/18    PT Start Time  1300    PT Stop Time  1335    PT Time Calculation (min)  35 min    Activity Tolerance  Patient limited by fatigue    Behavior During Therapy  [facility] for tasks assessed/performed       Past Medical History:  Diagnosis Date  . Allergic rhinitis   . Anxiety   . Chronic venous insufficiency   . Costochondritis 04/13/2017  . DDD (degenerative disc disease), cervical   . DDD (degenerative disc disease), lumbar   . History of colon polyps   . Hyperlipidemia   . Hypertension   . Hypothyroidism   . Insomnia   . Liver cyst   . Liver nodule   . OA (osteoarthritis)    Hip and knee  . Obese   . Pneumonia    hx  . Restless legs syndrome (RLS)   . Rotator cuff tear arthropathy, right   . Squamous cell carcinoma of left thigh   . Urge incontinence of urine     Past Surgical History:  Procedure Laterality Date  . APPENDECTOMY    . BLADDER SUSPENSION  2011  . COLONOSCOPY    . JOINT REPLACEMENT    . 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 REVERSE SHOULDER ARTHROPLASTY Right 05/14/2017   Procedure: RIGHT REVERSE SHOULDER ARTHROPLASTY;  Surgeon: Justice Britain, MD;  Location: [facility];  Service: Orthopedics;  Laterality: Right;  . REVERSE SHOULDER ARTHROPLASTY Left 07/30/2017   Procedure: REVERSE SHOULDER ARTHROPLASTY;  Surgeon: Justice Britain, MD;  Location: [facility];  Service: Orthopedics;  Laterality: Left;  . right knee replacment  08  . SACROILIAC JOINT FUSION Right 12/27/2014   Procedure: RIGHT SACROILIAC  JOINT FUSION;  Surgeon: Melina Schools, MD;  Location: [facility];  Service: Orthopedics;  Laterality: Right;  right sacral fusion  . TONSILLECTOMY    . TOTAL HIP ARTHROPLASTY  09/08/2011   Procedure: TOTAL HIP ARTHROPLASTY;  Surgeon: Gearlean Alf, MD;  [facility];  Service: Orthopedics;  Laterality: Right;  . TOTAL KNEE ARTHROPLASTY Left 02/26/2018   Procedure: LEFT TOTAL KNEE ARTHROPLASTY;  Surgeon: Mcarthur Rossetti, MD;  [facility];  Service: Orthopedics;  Laterality: Left;  . WISDOM TOOTH EXTRACTION      There were no vitals filed for this visit.  Subjective Assessment - 09/28/18 1303    Subjective  "I am moving slow today" Pt reports doing some yard work this morning    Currently in Pain?  Yes    Pain Score  4     Pain Location  Back    Pain Orientation  Right                       [facility] Adult PT Treatment/Exercise - 09/28/18 0001      Lumbar Exercises: Aerobic   Nustep  L 6 6 min      Lumbar Exercises: Machines for Strengthening   Cybex Lumbar Extension  black band flex and ext 25x    Cybex Knee Extension  20# 3 sets 10    Cybex Knee Flexion  35# 3 sets 10    Leg Press  50# x15, 20lb x15     Other Lumbar Machine Exercise  seated rows 35#, lats 35# 2x10 each    Other Lumbar Machine Exercise  OH wt ball ext 15 times, obl 15 each      Lumbar Exercises: Standing   Other Standing Lumbar Exercises  Trunk rotations with yellow ball  2x10      Lumbar Exercises: Seated   Sit to Stand  10 reps   holding yellow ball               PT Short Term Goals - 08/17/18 1138      PT SHORT TERM GOAL #1   Title  Ind with initial HEP    Status  Achieved        PT Long Term Goals - 09/09/18 1426      PT LONG TERM GOAL #1   Title  increase ROM of the lumbar spine 50%      PT LONG TERM GOAL #2   Title  patient report that she can sleep in her own bed most of the night    Status  Partially Met      PT LONG TERM GOAL #3   Title  increase  lumbar ROM 25%    Status  Achieved      PT LONG TERM GOAL #4   Title  understand posture and body mechanics    Status  Partially Met            Plan - 09/28/18 1335    Clinical Impression Statement  Pt enters clinic reporting increased fatigue. She stated that she worked in her yard this morning for three hours. Shortened treatment session die to fatigue. Pt was unable to do the same weight and or resistance that's he has been doing.    Stability/Clinical Decision Making  Stable/Uncomplicated    Rehab Potential  Good    PT Frequency  2x / week    PT Treatment/Interventions  ADLs/Self Care Home Management;Cryotherapy;Glass blower/designer;Therapeutic exercise;Gait training;Balance training;Neuromuscular re-education;Patient/family education;Manual techniques;Passive range of motion;Moist Heat;Traction;Ultrasound    PT Next Visit Plan  progress strength and prepare for independant ex program       Patient will benefit from skilled therapeutic intervention in order to improve the following deficits and impairments:  Abnormal gait, Pain, Decreased scar mobility, Decreased mobility, Decreased strength, Decreased range of motion, Decreased balance, Difficulty walking, Increased edema, Improper body mechanics, Cardiopulmonary status limiting activity, Increased muscle spasms, Postural dysfunction, Impaired flexibility  Visit Diagnosis: 1. Chronic right-sided low back pain with right-sided sciatica   2. Muscle spasm of back   3. Difficulty in walking, not elsewhere classified        Problem List Patient Active Problem List   Diagnosis Date Noted  . Status post total left knee replacement 02/26/2018  . Unilateral primary osteoarthritis, left knee 02/01/2018  . 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 SI (sacroiliac) pain 12/27/2014  . Postop Hyponatremia 09/10/2011  . OA (osteoarthritis) of hip 09/08/2011    Scot Jun 09/28/2018, 1:37 PM,  PTA  [place], [place], [address] Phone: [phone]   Fax:  [phone]  Name: [name] MRN: [id number] Date of Birth: 1945-12-09

## 2018-09-29 ENCOUNTER — Encounter: Payer: Self-pay | Admitting: Orthopaedic Surgery

## 2018-09-29 ENCOUNTER — Ambulatory Visit (INDEPENDENT_AMBULATORY_CARE_PROVIDER_SITE_OTHER): Payer: Medicare Other

## 2018-09-29 ENCOUNTER — Ambulatory Visit (INDEPENDENT_AMBULATORY_CARE_PROVIDER_SITE_OTHER): Payer: Medicare Other | Admitting: Orthopaedic Surgery

## 2018-09-29 DIAGNOSIS — M7061 Trochanteric bursitis, right hip: Secondary | ICD-10-CM | POA: Diagnosis not present

## 2018-09-29 DIAGNOSIS — M7062 Trochanteric bursitis, left hip: Secondary | ICD-10-CM

## 2018-09-29 DIAGNOSIS — Z96652 Presence of left artificial knee joint: Secondary | ICD-10-CM

## 2018-09-29 DIAGNOSIS — Z96641 Presence of right artificial hip joint: Secondary | ICD-10-CM

## 2018-09-29 NOTE — Progress Notes (Signed)
HPI: [name] comes in today now 7 months status post left total knee arthroplasty.  She is overall doing well in regards to the left knee.  She is having pain lateral aspect of both hips.  She has had prior hip replacements elsewhere.  She is having no groin pain pain is lateral aspect of both hips no radicular pain down either leg.  Review of systems: No fevers chills shortness of breath  Physical exam: Bilateral hips good range of motion without pain.  She has tenderness over the trochanteric region of both hips.  Left knee good range of motion without pain.  No instability valgus varus stressing.  Surgical incisions healing well no signs.   Radiographs: AP pelvis lateral view bilateral hips shows no acute fractures.  Well-seated arthroplasty components without any signs of hardware failure.  No bony abnormalities otherwise.  Impression: Bilateral trochanteric bursitis Status post left total knee arthroplasty 02/26/2018  Plan: Offered trochanteric injections today she defers.  Therefore we will have her do IT band stretching.  We will see her back in 1 year postop for AP and lateral views of the left knee.  Questions encouraged and answered.  Should follow-up sooner if there is any questions or concerns.

## 2018-09-30 ENCOUNTER — Ambulatory Visit: Payer: Medicare Other | Admitting: Physical Therapy

## 2018-09-30 ENCOUNTER — Other Ambulatory Visit: Payer: Self-pay

## 2018-09-30 DIAGNOSIS — G8929 Other chronic pain: Secondary | ICD-10-CM

## 2018-09-30 DIAGNOSIS — M62830 Muscle spasm of back: Secondary | ICD-10-CM

## 2018-09-30 DIAGNOSIS — R262 Difficulty in walking, not elsewhere classified: Secondary | ICD-10-CM

## 2018-09-30 NOTE — Therapy (Signed)
[facility] [place], [place], [address] Phone: [phone]   Fax:  [phone]  Physical Therapy Treatment  Patient Details  Name: ZURISADAI HELMINIAK MRN: [id number] Date of Birth: [birth year] Referring Provider (PT): Nelva Bush   Encounter Date: 09/30/2018  PT End of Session - 09/30/18 2841    Visit Number  15    Date for PT Re-Evaluation  10/03/18    PT Start Time  3244    PT Stop Time  1525    PT Time Calculation (min)  40 min       Past Medical History:  Diagnosis Date  . Allergic rhinitis   . Anxiety   . Chronic venous insufficiency   . Costochondritis 04/13/2017  . DDD (degenerative disc disease), cervical   . DDD (degenerative disc disease), lumbar   . History of colon polyps   . Hyperlipidemia   . Hypertension   . Hypothyroidism   . Insomnia   . Liver cyst   . Liver nodule   . OA (osteoarthritis)    Hip and knee  . Obese   . Pneumonia    hx  . Restless legs syndrome (RLS)   . Rotator cuff tear arthropathy, right   . Squamous cell carcinoma of left thigh   . Urge incontinence of urine     Past Surgical History:  Procedure Laterality Date  . APPENDECTOMY    . BLADDER SUSPENSION  2011  . COLONOSCOPY    . JOINT REPLACEMENT    . 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 REVERSE SHOULDER ARTHROPLASTY Right 05/14/2017   Procedure: RIGHT REVERSE SHOULDER ARTHROPLASTY;  Surgeon: Justice Britain, MD;  Location: [facility];  Service: Orthopedics;  Laterality: Right;  . REVERSE SHOULDER ARTHROPLASTY Left 07/30/2017   Procedure: REVERSE SHOULDER ARTHROPLASTY;  Surgeon: Justice Britain, MD;  Location: [facility];  Service: Orthopedics;  Laterality: Left;  . right knee replacment  08  . SACROILIAC JOINT FUSION Right 12/27/2014   Procedure: RIGHT SACROILIAC JOINT FUSION;  Surgeon: Melina Schools, MD;  Location: [facility];  Service: Orthopedics;  Laterality: Right;  right  sacral fusion  . TONSILLECTOMY    . TOTAL HIP ARTHROPLASTY  09/08/2011   Procedure: TOTAL HIP ARTHROPLASTY;  Surgeon: Gearlean Alf, MD;  Location: [facility];  Service: Orthopedics;  Laterality: Right;  . TOTAL KNEE ARTHROPLASTY Left 02/26/2018   Procedure: LEFT TOTAL KNEE ARTHROPLASTY;  Surgeon: Mcarthur Rossetti, MD;  Location: [facility];  Service: Orthopedics;  Laterality: Left;  . WISDOM TOOTH EXTRACTION      There were no vitals filed for this visit.  Subjective Assessment - 09/30/18 1448    Subjective  doing okay - ready to transition to independant    Currently in Pain?  Yes    Pain Score  2     Pain Location  Back                       [facility] Adult PT Treatment/Exercise - 09/30/18 0001      Lumbar Exercises: Aerobic   Recumbent Bike  Bike 5 min L1    Nustep  L 6 6 min      Lumbar Exercises: Machines for Strengthening   Cybex Lumbar Extension  black band flex and ext 25x    Cybex Knee Extension  20# 3 sets 10    Cybex Knee  Flexion  35# 3 sets 10    Leg Press  50# x15, 2 sets    Other Lumbar Machine Exercise  seated rows 35#, lats 35# 2x10 each    Other Lumbar Machine Exercise  OH wt ball ext 15 times, obl 15 each      Lumbar Exercises: Standing   Other Standing Lumbar Exercises  4 way hip pulley 10#               PT Short Term Goals - 08/17/18 1138      PT SHORT TERM GOAL #1   Title  Ind with initial HEP    Status  Achieved        PT Long Term Goals - 09/09/18 1426      PT LONG TERM GOAL #1   Title  increase ROM of the lumbar spine 50%      PT LONG TERM GOAL #2   Title  patient report that she can sleep in her own bed most of the night    Status  Partially Met      PT LONG TERM GOAL #3   Title  increase lumbar ROM 25%    Status  Achieved      PT LONG TERM GOAL #4   Title  understand posture and body mechanics    Status  Partially Met            Plan - 09/30/18 1512    Clinical Impression Statement  al goals met.     PT Treatment/Interventions  ADLs/Self Care Home Management;Cryotherapy;Glass blower/designer;Therapeutic exercise;Gait training;Balance training;Neuromuscular re-education;Patient/family education;Manual techniques;Passive range of motion;Moist Heat;Traction;Ultrasound    PT Next Visit Plan  D/C to gym program       Patient will benefit from skilled therapeutic intervention in order to improve the following deficits and impairments:  Abnormal gait, Pain, Decreased scar mobility, Decreased mobility, Decreased strength, Decreased range of motion, Decreased balance, Difficulty walking, Increased edema, Improper body mechanics, Cardiopulmonary status limiting activity, Increased muscle spasms, Postural dysfunction, Impaired flexibility  Visit Diagnosis: 1. Chronic right-sided low back pain with right-sided sciatica   2. Muscle spasm of back   3. Difficulty in walking, not elsewhere classified        Problem List Patient Active Problem List   Diagnosis Date Noted  . Status post total left knee replacement 02/26/2018  . Unilateral primary osteoarthritis, left knee 02/01/2018  . 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 SI (sacroiliac) pain 12/27/2014  . Postop Hyponatremia 09/10/2011  . OA (osteoarthritis) of hip 09/08/2011   .PHYSICAL THERAPY DISCHARGE SUMMARY   Plan: Patient agrees to discharge.  Patient goals were met. Patient is being discharged due to meeting the stated rehab goals.  ?????      ,ANGIE PTA 09/30/2018, 3:14 PM  [facility] [place], [place], [address] Phone: [phone]   Fax:  [phone]  Name: MARKIE HEFFERNAN MRN: [id number] Date of Birth: 1945/10/17

## 2018-10-05 ENCOUNTER — Ambulatory Visit: Payer: Medicare Other | Admitting: Physical Therapy

## 2018-10-07 ENCOUNTER — Ambulatory Visit: Payer: Medicare Other | Admitting: Physical Therapy

## 2018-11-17 ENCOUNTER — Other Ambulatory Visit: Payer: Self-pay | Admitting: Internal Medicine

## 2018-11-17 DIAGNOSIS — Z1231 Encounter for screening mammogram for malignant neoplasm of breast: Secondary | ICD-10-CM

## 2018-12-31 ENCOUNTER — Other Ambulatory Visit: Payer: Self-pay

## 2018-12-31 ENCOUNTER — Ambulatory Visit
Admission: RE | Admit: 2018-12-31 | Discharge: 2018-12-31 | Disposition: A | Discharge status: Home / Self Care | Payer: Medicare Other | Source: Ambulatory Visit | Attending: Internal Medicine | Admitting: Internal Medicine

## 2018-12-31 DIAGNOSIS — Z1231 Encounter for screening mammogram for malignant neoplasm of breast: Secondary | ICD-10-CM

## 2019-03-12 ENCOUNTER — Ambulatory Visit: Payer: Medicare Other

## 2019-03-14 ENCOUNTER — Ambulatory Visit: Payer: Medicare Other | Attending: Internal Medicine

## 2019-03-14 DIAGNOSIS — Z23 Encounter for immunization: Secondary | ICD-10-CM

## 2019-03-14 NOTE — Progress Notes (Signed)
   Covid-19 Vaccination Clinic  Name:  [name]    MRN: [id number] DOB: [birth year]  03/14/2019  [name] was observed post Covid-19 immunization for 15 minutes without incidence. She was provided with Vaccine Information Sheet and instruction to access the V-Safe system.   [name] was instructed to call 911 with any severe reactions post vaccine: Marland Kitchen Difficulty breathing  . Swelling of your face and throat  . A fast heartbeat  . A bad rash all over your body  . Dizziness and weakness    Immunizations Administered    Name Date Dose VIS Date Route   Pfizer COVID-19 Vaccine 03/14/2019  1:11 PM 0.3 mL 01/28/2019 Intramuscular   Manufacturer: [place]   Lot: [id number]   [place]: [id number]

## 2019-04-04 ENCOUNTER — Ambulatory Visit: Payer: Medicare Other | Attending: Internal Medicine

## 2019-04-04 DIAGNOSIS — Z23 Encounter for immunization: Secondary | ICD-10-CM | POA: Insufficient documentation

## 2019-04-04 NOTE — Progress Notes (Signed)
   Covid-19 Vaccination Clinic  Name:  KAREY SCHILKE    MRN: [id number] DOB: [birth year]  04/04/2019  Ms. Lazarz was observed post Covid-19 immunization for 15 minutes without incidence. She was provided with Vaccine Information Sheet and instruction to access the V-Safe system.   Ms. Rasmusson was instructed to call 911 with any severe reactions post vaccine: Marland Kitchen Difficulty breathing  . Swelling of your face and throat  . A fast heartbeat  . A bad rash all over your body  . Dizziness and weakness    Immunizations Administered    Name Date Dose VIS Date Route   Pfizer COVID-19 Vaccine 04/04/2019 12:09 PM 0.3 mL 01/28/2019 Intramuscular   Manufacturer: [place]   Lot: [id number]   [place]: [id number]

## 2019-12-01 ENCOUNTER — Other Ambulatory Visit: Payer: Self-pay | Admitting: Internal Medicine

## 2019-12-01 DIAGNOSIS — Z1231 Encounter for screening mammogram for malignant neoplasm of breast: Secondary | ICD-10-CM

## 2019-12-27 ENCOUNTER — Ambulatory Visit (INDEPENDENT_AMBULATORY_CARE_PROVIDER_SITE_OTHER): Payer: Medicare Other

## 2019-12-27 ENCOUNTER — Ambulatory Visit (INDEPENDENT_AMBULATORY_CARE_PROVIDER_SITE_OTHER): Payer: Medicare Other | Admitting: Orthopaedic Surgery

## 2019-12-27 ENCOUNTER — Encounter: Payer: Self-pay | Admitting: Orthopaedic Surgery

## 2019-12-27 DIAGNOSIS — Z96643 Presence of artificial hip joint, bilateral: Secondary | ICD-10-CM

## 2019-12-27 DIAGNOSIS — Z96652 Presence of left artificial knee joint: Secondary | ICD-10-CM

## 2019-12-27 NOTE — Progress Notes (Signed)
Office Visit Note   Patient: [name]           Date of Birth: 06-Sep-1945           MRN: [id number] Visit Date: 12/27/2019              Requested by: Kristopher Glee., MD [address] [place],  [place] [address] PCP: Kristopher Glee., MD   Assessment & Plan: Visit Diagnoses:  1. History of left knee replacement   2. H/O bilateral hip replacements     Plan: At this point I gave her reassurance that both her hips and knees look good.  Follow-up can be as needed.  If she does develop any type of swelling or dull aching pain or other issues she knows to come back and see [place] for new x-rays.  All questions and concerns were answered and addressed.  Follow-Up Instructions: Return if symptoms worsen or fail to improve.   Orders:  Orders Placed This Encounter  Procedures  . XR Knee 1-2 Views Left  . XR Pelvis 1-2 Views   No orders of the defined types were placed in this encounter.     Procedures: No procedures performed   Clinical Data: No additional findings.   Subjective: Chief Complaint  Patient presents with  . Left Knee - Follow-up  The patient is well-known to me.  She is a year out from a left total knee arthroplasty.  She said that knee has done very well for her.  She also has remote history of a right total knee arthroplasty and bilateral total hip arthroplasties.  Those were all done by one of my colleagues in town.  She says her hips do not hurt but feel weak at times.  She wanted those checked out today.  She noticed this when she was at the beach last week but one of them felt just weak.  She is not walking with assistive device and has no limp.  She is an active 74 year old female.  She denies any acute changes in her medical status.  HPI  Review of Systems She currently denies any headache, chest pain, shortness of breath, fever, chills, nausea, vomiting  Objective: Vital Signs: There were no vitals taken for this visit.  Physical  Exam She is alert and oriented x3 and in no acute distress Ortho Exam Examination of both hips show the move smoothly and fluidly with no pain at all.  Her left more recent knee has full flexion extension.  It is ligamentously stable with no effusion. Specialty Comments:  No specialty comments available.  Imaging: XR Knee 1-2 Views Left  Result Date: 12/27/2019 An AP and lateral of the left knee show a total knee arthroplasty with no complicating features.  XR Pelvis 1-2 Views  Result Date: 12/27/2019 An AP pelvis and lateral both hips shows well-seated total hip arthroplasties with no evidence of osteolysis or loosening.  The hip ball is in the central part of both acetabular components.    PMFS History: Patient Active Problem List   Diagnosis Date Noted  . Status post total left knee replacement 02/26/2018  . Unilateral primary osteoarthritis, left knee 02/01/2018  . 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 SI (sacroiliac) pain 12/27/2014  . Postop Hyponatremia 09/10/2011  . OA (osteoarthritis) of hip 09/08/2011   Past Medical History:  Diagnosis Date  . Allergic rhinitis   . Anxiety   . Chronic venous insufficiency   .  Costochondritis 04/13/2017  . DDD (degenerative disc disease), cervical   . DDD (degenerative disc disease), lumbar   . History of colon polyps   . Hyperlipidemia   . Hypertension   . Hypothyroidism   . Insomnia   . Liver cyst   . Liver nodule   . OA (osteoarthritis)    Hip and knee  . Obese   . Pneumonia    hx  . Restless legs syndrome (RLS)   . Rotator cuff tear arthropathy, right   . Squamous cell carcinoma of left thigh   . Urge incontinence of urine     Family History  Problem Relation Age of Onset  . Heart disease Father     Past Surgical History:  Procedure Laterality Date  . APPENDECTOMY    . BLADDER SUSPENSION  2011  . COLONOSCOPY    . JOINT REPLACEMENT    . left hip replacment  revision 10 yrs ago, replacement  done 10 yrs prior to revision   02  . REVERSE SHOULDER ARTHROPLASTY Right 05/14/2017   Procedure: RIGHT REVERSE SHOULDER ARTHROPLASTY;  Surgeon: Justice Britain, MD;  Location: [facility];  Service: Orthopedics;  Laterality: Right;  . REVERSE SHOULDER ARTHROPLASTY Left 07/30/2017   Procedure: REVERSE SHOULDER ARTHROPLASTY;  Surgeon: Justice Britain, MD;  Location: [facility];  Service: Orthopedics;  Laterality: Left;  . right knee replacment  08  . SACROILIAC JOINT FUSION Right 12/27/2014   Procedure: RIGHT SACROILIAC JOINT FUSION;  Surgeon: Melina Schools, MD;  Location: [facility];  Service: Orthopedics;  Laterality: Right;  right sacral fusion  . TONSILLECTOMY    . TOTAL HIP ARTHROPLASTY  09/08/2011   Procedure: TOTAL HIP ARTHROPLASTY;  Surgeon: Gearlean Alf, MD;  Location: [facility];  Service: Orthopedics;  Laterality: Right;  . TOTAL KNEE ARTHROPLASTY Left 02/26/2018   Procedure: LEFT TOTAL KNEE ARTHROPLASTY;  Surgeon: Mcarthur Rossetti, MD;  Location: [facility];  Service: Orthopedics;  Laterality: Left;  . WISDOM TOOTH EXTRACTION     Social History   Occupational History  . Not on file  Tobacco Use  . Smoking status: Never Smoker  . Smokeless tobacco: Never Used  Vaping Use  . Vaping Use: Never used  Substance and Sexual Activity  . Alcohol use: No  . Drug use: No  . Sexual activity: Not on file

## 2020-01-23 ENCOUNTER — Ambulatory Visit: Payer: Medicare Other

## 2020-04-03 ENCOUNTER — Ambulatory Visit
Admission: RE | Admit: 2020-04-03 | Discharge: 2020-04-03 | Disposition: A | Discharge status: Home / Self Care | Payer: Medicare Other | Source: Ambulatory Visit | Attending: Internal Medicine | Admitting: Internal Medicine

## 2020-04-03 ENCOUNTER — Other Ambulatory Visit: Payer: Self-pay

## 2020-04-03 DIAGNOSIS — Z1231 Encounter for screening mammogram for malignant neoplasm of breast: Secondary | ICD-10-CM

## 2021-03-07 ENCOUNTER — Other Ambulatory Visit: Payer: Self-pay | Admitting: Obstetrics and Gynecology

## 2021-03-07 DIAGNOSIS — Z1231 Encounter for screening mammogram for malignant neoplasm of breast: Secondary | ICD-10-CM

## 2021-04-08 ENCOUNTER — Ambulatory Visit
Admission: RE | Admit: 2021-04-08 | Discharge: 2021-04-08 | Disposition: A | Discharge status: Home / Self Care | Payer: Medicare Other | Source: Ambulatory Visit

## 2021-04-08 DIAGNOSIS — Z1231 Encounter for screening mammogram for malignant neoplasm of breast: Secondary | ICD-10-CM

## 2021-05-22 ENCOUNTER — Encounter: Payer: Self-pay | Admitting: Orthopaedic Surgery

## 2021-05-22 ENCOUNTER — Ambulatory Visit (INDEPENDENT_AMBULATORY_CARE_PROVIDER_SITE_OTHER): Payer: Medicare Other

## 2021-05-22 ENCOUNTER — Ambulatory Visit (INDEPENDENT_AMBULATORY_CARE_PROVIDER_SITE_OTHER): Payer: Medicare Other | Admitting: Orthopaedic Surgery

## 2021-05-22 ENCOUNTER — Other Ambulatory Visit: Payer: Self-pay

## 2021-05-22 DIAGNOSIS — Z96643 Presence of artificial hip joint, bilateral: Secondary | ICD-10-CM

## 2021-05-22 DIAGNOSIS — M79604 Pain in right leg: Secondary | ICD-10-CM

## 2021-05-22 NOTE — Progress Notes (Signed)
The patient is a 76 year old female that I have seen before.  She has a history of both her hips being replaced with the right hip being replaced in 2013 by Dr. Graceann Congress and the left hip being replaced about 20 years ago.  I have seen her for the symptoms before.  She comes in today reporting right thigh pain.  She has a history of a right SI joint fusion.  She still sees Dr. Herma Mering at [facility] and about 4 weeks ago had some type of ablation of the facet joints in the lower right aspect of her lumbar spine.  She says she has not had an MRI of her lumbar spine in years. ? ?Both hips move smoothly and fluidly.  There is no pain around the trochanteric area of either hip and no pain in the groin.  She does have anterior thigh pain and a positive straight leg raise on the right side.  She has low back pain to the right side as well. ? ?An AP pelvis and lateral both hips is obtained and shows well-seated total hip arthroplasties with no evidence of osteolysis.  The hips are well seated with no complicating features.  There is no obvious polyethylene liner wear either. ? ?At this point I would like to send her for an MRI of her lumbar spine.  I suspect there may be nerve compression at the upper lumbar spine that is causing her thigh pain on the right side.  We will see her back once we have the MRI of her lumbar spine.  She agrees with this treatment plan. ?

## 2021-05-23 ENCOUNTER — Telehealth: Payer: Self-pay | Admitting: Orthopaedic Surgery

## 2021-05-23 NOTE — Telephone Encounter (Signed)
Called pt 1X and left vm for pt to call and se MRI review appt with Dr. Philis Fendt after 05/28/21 ?

## 2021-05-23 NOTE — Telephone Encounter (Signed)
Pt called stating Dr. Ninfa Linden stated she needing to be seen 2 days after MRI that is scheduled 4/11/ Please call pt about this matter at [phone] ?

## 2021-05-23 NOTE — Telephone Encounter (Signed)
LMOM for patient letting her know she doesn't need to be seen 2 days after MRI, she just needs to make a regular followup appointment whenever the next available appt is ?We don't need to work her in  ?

## 2021-05-28 ENCOUNTER — Other Ambulatory Visit: Payer: Medicare Other

## 2021-06-10 ENCOUNTER — Ambulatory Visit: Payer: Medicare Other | Admitting: Orthopaedic Surgery

## 2021-11-22 ENCOUNTER — Encounter (HOSPITAL_BASED_OUTPATIENT_CLINIC_OR_DEPARTMENT_OTHER): Payer: Self-pay | Admitting: Urology

## 2021-11-22 ENCOUNTER — Emergency Department (HOSPITAL_BASED_OUTPATIENT_CLINIC_OR_DEPARTMENT_OTHER)
Admission: EM | Admit: 2021-11-22 | Discharge: 2021-11-22 | Disposition: A | Discharge status: Home / Self Care | Payer: Medicare Other | Attending: Emergency Medicine | Admitting: Emergency Medicine

## 2021-11-22 ENCOUNTER — Other Ambulatory Visit: Payer: Self-pay

## 2021-11-22 ENCOUNTER — Other Ambulatory Visit (HOSPITAL_BASED_OUTPATIENT_CLINIC_OR_DEPARTMENT_OTHER): Payer: Self-pay

## 2021-11-22 ENCOUNTER — Emergency Department (HOSPITAL_BASED_OUTPATIENT_CLINIC_OR_DEPARTMENT_OTHER): Payer: Medicare Other

## 2021-11-22 DIAGNOSIS — Z96642 Presence of left artificial hip joint: Secondary | ICD-10-CM | POA: Diagnosis not present

## 2021-11-22 DIAGNOSIS — W010XXA Fall on same level from slipping, tripping and stumbling without subsequent striking against object, initial encounter: Secondary | ICD-10-CM | POA: Diagnosis not present

## 2021-11-22 DIAGNOSIS — Z7982 Long term (current) use of aspirin: Secondary | ICD-10-CM | POA: Insufficient documentation

## 2021-11-22 DIAGNOSIS — M25551 Pain in right hip: Secondary | ICD-10-CM | POA: Diagnosis present

## 2021-11-22 DIAGNOSIS — Z96652 Presence of left artificial knee joint: Secondary | ICD-10-CM | POA: Insufficient documentation

## 2021-11-22 DIAGNOSIS — W19XXXA Unspecified fall, initial encounter: Secondary | ICD-10-CM

## 2021-11-22 DIAGNOSIS — M79604 Pain in right leg: Secondary | ICD-10-CM | POA: Diagnosis not present

## 2021-11-22 MED ORDER — DICLOFENAC SODIUM 1 % EX GEL
2.0000 g | Freq: Four times a day (QID) | CUTANEOUS | 0 refills | Status: AC | PRN
  Filled 2021-11-22: qty 100, 7d supply, fill #0
  Filled 2021-11-22: qty 100, 10d supply, fill #0

## 2021-11-22 MED ORDER — KETOROLAC TROMETHAMINE 15 MG/ML IJ SOLN
15.0000 mg | Freq: Once | INTRAMUSCULAR | Status: AC
  Administered 2021-11-22: 15 mg via INTRAMUSCULAR
  Filled 2021-11-22: qty 1

## 2021-11-22 MED ORDER — ACETAMINOPHEN 500 MG PO TABS
1000.0000 mg | ORAL_TABLET | Freq: Once | ORAL | Status: AC
  Administered 2021-11-22: 1000 mg via ORAL
  Filled 2021-11-22: qty 2

## 2021-11-22 NOTE — Discharge Instructions (Signed)
You were seen in the emergency room today with pain after fall.  Do not find a broken bones which may be stiff and sore for several days.  Please continue to stay in motion and ambulatory as much as you are able to but take it easy.  May follow with your primary care physician but I have also listed the name for an orthopedist if you continue to have discomfort it may be beneficial seeing them.

## 2021-11-22 NOTE — ED Notes (Addendum)
error 

## 2021-11-22 NOTE — Social Work (Signed)
CSW spoke to provider, provider states that the Pt needed a standard walker. CSW called Zack from [employer] to put that order in. TOC will continue to follow to make sure that the Pt is DC with the medical equipment that they need.

## 2021-11-22 NOTE — ED Notes (Signed)
Pt attempted to be discharged . Pt tried to stand, but unable too per NT. EDP notified

## 2021-11-22 NOTE — ED Provider Notes (Signed)
[facility] EMERGENCY DEPARTMENT Provider Note   CSN: [id number] Arrival date & time: 11/22/21  1411     History  Chief Complaint  Patient presents with   Leg Injury    [name] is a 76 y.o. female.  Patient with history of previous left hip replacement, left knee replacement, no anticoagulation -- presents to the emergency department today for evaluation of right hip and leg pain starting acutely just prior to arrival.  Patient was at a diner when she slipped and fell.  She states that there was something on the floor.  She did not have associated chest pain, shortness of breath, lightheadedness, or dizziness prior to falling.  She thinks that she fell onto her rear end but had immediate pain in her right hip and posterior thigh.  She could not bear weight.  She was helped up by bystanders and was able to make it to the emergency department with a help of family.  Bearing weight causes the pain to be much worse.  Pain is better when she is lying still.  No lower back pain.  She did not hit her head or or lose consciousness.  No neck pain or headache.       Home Medications Prior to Admission medications   Medication Sig Start Date End Date Taking? Authorizing Provider  ASHWAGANDHA PO Take 1 capsule by mouth 2 (two) times daily.    [provider]  aspirin EC 325 MG EC tablet Take 1 tablet (325 mg total) by mouth 2 (two) times daily after a meal. 02/27/18   Mcarthur Rossetti, MD  Calcium Carbonate-Vitamin D (CALTRATE 600+D) 600-400 MG-UNIT tablet Take 2 tablets by mouth daily.    [provider]  Carboxymethylcellul-Glycerin ([employer] LUBRICATING/DRY EYE OP) Place 1 drop into both eyes 3 (three) times daily as needed (for dry eyes).    [provider]  cetirizine (ZYRTEC) 10 MG tablet Take 10 mg by mouth daily.    [provider]  Cholecalciferol (VITAMIN D3) 5000 UNITS TABS Take 5,000 Units by mouth at bedtime.     [provider]  Coenzyme Q10 (CO Q 10) 100 MG CAPS Take 100 mg by mouth every morning.     [provider]  diclofenac sodium (VOLTAREN) 1 % GEL Apply 2 g topically 4 (four) times daily. 06/02/18   Mcarthur Rossetti, MD  gabapentin (NEURONTIN) 300 MG capsule Take 1,500 mg by mouth at bedtime.     [provider]  guaiFENesin (MUCINEX) 600 MG 12 hr tablet Take 600 mg by mouth daily as needed for cough or to loosen phlegm.     [provider]  HYDROcodone-acetaminophen (NORCO/VICODIN) 5-325 MG tablet Take 1-2 tablets by mouth every 4 (four) hours as needed for moderate pain or severe pain. 03/09/18   Mcarthur Rossetti, MD  Krill Oil 1000 MG CAPS Take 1,000 mg by mouth 2 (two) times daily.     [provider]  levothyroxine (SYNTHROID, LEVOTHROID) 88 MCG tablet Take 88 mcg by mouth daily before breakfast.     [provider]  liothyronine (CYTOMEL) 5 MCG tablet Take 5 mcg by mouth daily.    [provider]  lisinopril-hydrochlorothiazide (PRINZIDE,ZESTORETIC) 20-25 MG tablet Take 1 tablet by mouth daily with breakfast.    [provider]  methocarbamol (ROBAXIN) 500 MG tablet Take 1 tablet (500 mg total) by mouth every 6 (six) hours as needed for muscle spasms. 03/16/18   Jean Rosenthal  Y, MD  methylPREDNISolone (MEDROL) 4 MG tablet Medrol dose pack. Take as instructed 06/02/18   Mcarthur Rossetti, MD  MILK THISTLE PO Take 1 capsule by mouth daily.     [provider]  Multiple Vitamins-Minerals (MULTIVITAMIN WITH MINERALS) tablet Take 1 tablet by mouth at bedtime.    [provider]  NONFORMULARY OR COMPOUNDED ITEM Apply 1 application topically 2 (two) times a week. Estradiol 0.1% Cream - Fingertip amount twice weekly on Tuesday and Friday    [provider]  Probiotic Product (PROBIOTIC PO) Take 1 capsule by mouth daily.    [provider]  rosuvastatin (CRESTOR) 10 MG tablet Take 10  mg by mouth at bedtime.    [provider]  temazepam (RESTORIL) 30 MG capsule Take 30 mg by mouth at bedtime.    [provider]      Allergies    Atorvastatin, Tramadol, and Oxycodone    Review of Systems   Review of Systems  Physical Exam Updated Vital Signs BP (!) 173/58 (BP Location: Left Arm)   Pulse 72   Temp 98.1 F (36.7 C) (Oral)   Resp 17   Ht '5\' 4"'$  (1.626 m)   Wt 84.4 kg   SpO2 96%   BMI 31.93 kg/m   Physical Exam Vitals and nursing note reviewed.  Constitutional:      General: She is not in acute distress.    Appearance: She is well-developed.  HENT:     Head: Normocephalic and atraumatic.     Right Ear: External ear normal.     Left Ear: External ear normal.     Nose: Nose normal.  Eyes:     Conjunctiva/sclera: Conjunctivae normal.  Cardiovascular:     Rate and Rhythm: Normal rate and regular rhythm.     Pulses:          Dorsalis pedis pulses are 2+ on the right side and 2+ on the left side.     Heart sounds: No murmur heard. Pulmonary:     Effort: No respiratory distress.     Breath sounds: No wheezing, rhonchi or rales.  Abdominal:     Palpations: Abdomen is soft.     Tenderness: There is no abdominal tenderness. There is no guarding or rebound.  Musculoskeletal:     Cervical back: Normal range of motion and neck supple. No tenderness. Normal range of motion.     Thoracic back: No tenderness. Normal range of motion.     Lumbar back: No tenderness. Normal range of motion.     Right hip: No tenderness. Normal range of motion.     Left hip: Normal range of motion.     Right upper leg: No tenderness.     Right knee: No tenderness.     Right lower leg: No edema.     Left lower leg: No edema.     Comments: Patient is able to raise her right leg off the bed, but when doing so has acute worsening of pain that she reports in the right hip and posterior thigh.  Skin:    General: Skin is warm and dry.     Findings: No rash.   Neurological:     General: No focal deficit present.     Mental Status: She is alert. Mental status is at baseline.     Motor: No weakness.  Psychiatric:        Mood and Affect: Mood normal.     ED Results /  Procedures / Treatments   Labs (all labs ordered are listed, but only abnormal results are displayed) Labs Reviewed - No data to display  EKG None  Radiology DG Femur Min 2 Views Right  Result Date: 11/22/2021 CLINICAL DATA:  Right hip pain after fall. EXAM: RIGHT FEMUR 2 VIEWS COMPARISON:  May 22, 2021. FINDINGS: There is no evidence of fracture or other focal bone lesions. Soft tissues are unremarkable. Status post right total hip and knee arthroplasties. IMPRESSION: No acute abnormality seen. Electronically Signed   By: Marijo Conception M.D.   On: 11/22/2021 15:09   DG Pelvis 1-2 Views  Result Date: 11/22/2021 CLINICAL DATA:  Fall, right leg pain EXAM: PELVIS - 1-2 VIEW COMPARISON:  12/27/2019 FINDINGS: Extensive postoperative changes pelvis and hips including bilateral total hip arthroplasties and prior right sacroiliac joint effusion. No perihardware lucency or fracture is evident. No fracture or diastasis of the pelvis. Degenerative changes of the imaged lower lumbar spine. IMPRESSION: 1. No acute fracture or diastasis of the pelvis. 2. Extensive postoperative changes of the pelvis and hips. Electronically Signed   By: Davina Poke D.O.   On: 11/22/2021 15:08    Procedures Procedures    Medications Ordered in ED Medications  ketorolac (TORADOL) 15 MG/ML injection 15 mg (has no administration in time range)  acetaminophen (TYLENOL) tablet 1,000 mg (1,000 mg Oral Given 11/22/21 1506)    ED Course/ Medical Decision Making/ A&P    Patient seen and examined. History obtained directly from patient.   Labs/EKG: None ordered  Imaging: Ordered X-ray of the R pelvis and hip  Medications/Fluids: Ordered: tylenol  Most recent vital signs reviewed and are as  follows: BP (!) 173/58 (BP Location: Left Arm)   Pulse 72   Temp 98.1 F (36.7 C) (Oral)   Resp 17   Ht '5\' 4"'$  (1.626 m)   Wt 84.4 kg   SpO2 96%   BMI 31.93 kg/m   Initial impression: fall, hip pain  3:14 PM x-rays personally reviewed and interpreted, agree no fracture or hip dislocation.  3:31 PM Signout to Dr. Laverta Baltimore at shift change.                             Medical Decision Making Amount and/or Complexity of Data Reviewed Radiology: ordered.  Risk OTC drugs.           Final Clinical Impression(s) / ED Diagnoses Final diagnoses:  Fall, initial encounter  Right hip pain    Rx / DC Orders ED Discharge Orders     None         Carlisle Cater, PA-C 11/22/21 1531    Long, Wonda Olds, MD 11/22/21 1610

## 2021-11-22 NOTE — ED Triage Notes (Signed)
Pt states slipped at a Diner approx 2 hours ago , states right posterior thigh pain  Unable to bear weight on right leg States artificial right hip  No shortening or rotation noted

## 2021-11-22 NOTE — ED Provider Notes (Signed)
Blood pressure (!) 159/67, pulse 67, temperature 98.1 F (36.7 C), resp. rate 18, height '5\' 4"'$  (1.626 m), weight 84.4 kg, SpO2 99 %.   In short, [name] is a 76 y.o. female with a chief complaint of Leg Injury .  Refer to the original H&P for additional details.     Durable Medical Equipment  (From admission, onward)           Start     Ordered   11/22/21 1829  For home use only DME standard manual wheelchair with seat cushion  Once       Comments: Patient suffers from leg pain which impairs their ability to perform daily activities like bathing, dressing, grooming, and toileting in the home.  A cane, crutch, or walker will not resolve issue with performing activities of daily living. A wheelchair will allow patient to safely perform daily activities. Patient can safely propel the wheelchair in the home or has a caregiver who can provide assistance. Length of need 6 months . Accessories: elevating leg rests (ELRs), wheel locks, extensions and anti-tippers.   11/22/21 1829           Patient with pain on ambulation/attempted ambulation with crutches.  CT showing partial tear of the hamstring.  Plan for ACE wrap, ortho follow up, and wheelchair at home.    Margette Fast, MD 11/22/21 [phone]

## 2021-11-24 ENCOUNTER — Encounter: Payer: Self-pay | Admitting: Orthopaedic Surgery

## 2021-11-24 ENCOUNTER — Telehealth: Payer: Self-pay

## 2021-11-24 NOTE — Telephone Encounter (Signed)
Patient called as she was expecting home health and had not heard back regarding this. Review of chart indicated no HH orders, HH was not set up Apologized to patient, Message sent to MD who saw her. Instructed patient to call her primary on Monday to initiate [facility]. She did obtain a wheelchair while in the ED.

## 2021-11-25 ENCOUNTER — Other Ambulatory Visit: Payer: Self-pay

## 2021-11-26 ENCOUNTER — Telehealth: Payer: Self-pay | Admitting: Surgery

## 2021-11-26 NOTE — Telephone Encounter (Signed)
[facility] ED RNCM received secured message from [place] concerning patient needing HHPT orders as per Clarene Reamer . RNCM reviewed chart patient was seen in the ED 10/6. Forwarded message to EDP, once orders are in will send to [facility] as per requested by patient.

## 2021-11-27 ENCOUNTER — Telehealth (HOSPITAL_BASED_OUTPATIENT_CLINIC_OR_DEPARTMENT_OTHER): Payer: Self-pay | Admitting: Emergency Medicine

## 2021-11-27 NOTE — Telephone Encounter (Signed)
Patient requiring HHPT orders.

## 2021-12-13 ENCOUNTER — Ambulatory Visit: Payer: Medicare Other | Attending: Orthopedic Surgery | Admitting: Physical Therapy

## 2021-12-13 ENCOUNTER — Encounter: Payer: Self-pay | Admitting: Physical Therapy

## 2021-12-13 DIAGNOSIS — M25551 Pain in right hip: Secondary | ICD-10-CM | POA: Insufficient documentation

## 2021-12-13 DIAGNOSIS — R278 Other lack of coordination: Secondary | ICD-10-CM | POA: Insufficient documentation

## 2021-12-13 DIAGNOSIS — R2681 Unsteadiness on feet: Secondary | ICD-10-CM | POA: Insufficient documentation

## 2021-12-13 DIAGNOSIS — R262 Difficulty in walking, not elsewhere classified: Secondary | ICD-10-CM | POA: Diagnosis not present

## 2021-12-13 DIAGNOSIS — M6281 Muscle weakness (generalized): Secondary | ICD-10-CM | POA: Diagnosis present

## 2021-12-13 NOTE — Therapy (Signed)
OUTPATIENT PHYSICAL THERAPY LOWER EXTREMITY EVALUATION   Patient Name: [name] MRN: [id number] DOB:August 01, 1945, 76 y.o., female Today's Date: 12/13/2021   PT End of Session - 12/13/21 1156     Visit Number 1    Date for PT Re-Evaluation 03/07/22    PT Start Time 0941    PT Stop Time 1012    PT Time Calculation (min) 31 min    Activity Tolerance Patient tolerated treatment well    Behavior During Therapy [facility] for tasks assessed/performed             Past Medical History:  Diagnosis Date   Allergic rhinitis    Anxiety    Chronic venous insufficiency    Costochondritis 04/13/2017   DDD (degenerative disc disease), cervical    DDD (degenerative disc disease), lumbar    History of colon polyps    Hyperlipidemia    Hypertension    Hypothyroidism    Insomnia    Liver cyst    Liver nodule    OA (osteoarthritis)    Hip and knee   Obese    Pneumonia    hx   Restless legs syndrome (RLS)    Rotator cuff tear arthropathy, right    Squamous cell carcinoma of left thigh    Urge incontinence of urine    Past Surgical History:  Procedure Laterality Date   APPENDECTOMY     BLADDER SUSPENSION  2011   BREAST CYST EXCISION Left    over 50 years ago   COLONOSCOPY     JOINT REPLACEMENT     left hip replacment  revision 10 yrs ago, replacement done 10 yrs prior to revision   02   REVERSE SHOULDER ARTHROPLASTY Right 05/14/2017   Procedure: RIGHT REVERSE SHOULDER ARTHROPLASTY;  Surgeon: Justice Britain, MD;  Location: [facility];  Service: Orthopedics;  Laterality: Right;   REVERSE SHOULDER ARTHROPLASTY Left 07/30/2017   Procedure: REVERSE SHOULDER ARTHROPLASTY;  Surgeon: Justice Britain, MD;  Location: [facility];  Service: Orthopedics;  Laterality: Left;   right knee replacment  08   SACROILIAC JOINT FUSION Right 12/27/2014   Procedure: RIGHT SACROILIAC JOINT FUSION;  Surgeon: Melina Schools, MD;  Location: [facility];  Service: Orthopedics;  Laterality: Right;  right sacral fusion    TONSILLECTOMY     TOTAL HIP ARTHROPLASTY  09/08/2011   Procedure: TOTAL HIP ARTHROPLASTY;  Surgeon: Gearlean Alf, MD;  Location: [facility];  Service: Orthopedics;  Laterality: Right;   TOTAL KNEE ARTHROPLASTY Left 02/26/2018   Procedure: LEFT TOTAL KNEE ARTHROPLASTY;  Surgeon: Mcarthur Rossetti, MD;  Location: [facility];  Service: Orthopedics;  Laterality: Left;   WISDOM TOOTH EXTRACTION     Patient Active Problem List   Diagnosis Date Noted   Status post total left knee replacement 02/26/2018   Unilateral primary osteoarthritis, left knee 02/01/2018   S/p reverse total shoulder arthroplasty 05/14/2017   SI (sacroiliac) pain 12/27/2014   Postop Hyponatremia 09/10/2011   OA (osteoarthritis) of hip 09/08/2011    PCP: Kristopher Glee  REFERRING PROVIDER: Gareth Morgan, MD   REFERRING DIAG: Diagnosis [phone] (ICD-10-CM) - Strain of muscle, fascia and tendon of the posterior muscle group at thigh level, right thigh, initial encounter   THERAPY DIAG:  Difficulty in walking, not elsewhere classified  Muscle weakness (generalized)  Other lack of coordination  Unsteadiness on feet  Pain in right hip  Rationale for Evaluation and Treatment: Rehabilitation  ONSET DATE: 12/02/2021   SUBJECTIVE:   SUBJECTIVE  STATEMENT: Patient reports that she has been in a W/C since her injury. She can stand and wlak with crutches without pain.  PERTINENT HISTORY: 1. Hamstring tendon rupture, right, initial encounter   Plan:   1. Clinically the patient's physical exam and history today are most suggestive of a hamstring insertional injury. She appears to have intact strength and does not appear to have significant limitation associated with this. Given the patient's age and other medical issues initial treatment would be nonoperative. We have suggested the patient begin exercise we will refer to physical therapy if requested. She will follow-up with [place] in 4 to 6 weeks if her symptoms  persist. If her symptoms otherwise improved she can follow-up on an as-needed basis History of Present Illness: [name] is a 76 y.o. female who presents with right hip pain for which Orthopaedic evaluation was requested.  Patient has a complicated orthopedic history. She has previously been seen at [facility] as well as at [facility]. She has had an SI joint fusion on 1 side. She has bilateral had bilateral total hip arthroplasties. She comes in today complaining of pain near the hamstring insertion onto her pelvis.  IPAIN:  Are you having pain? Yes: NPRS scale: 4/10 Pain location: back of leg Pain description: sharp Aggravating factors: Using the muscle Relieving factors: crutches, rest  PRECAUTIONS: None  WEIGHT BEARING RESTRICTIONS: No  FALLS:  Has patient fallen in last 6 months? Yes. Number of falls 1  LIVING ENVIRONMENT: Lives with: lives with their family Lives in: House/apartment Stairs: Yes: External: 1 steps; none Has following equipment at home: Crutches and Wheelchair (manual)  OCCUPATION: N/A,   PLOF: Independent  PATIENT GOALS: To be able to walk without pain, not re-injure her tendon   OBJECTIVE:   DIAGNOSTIC FINDINGS: nitial impression: fall, hip pain   3:14 PM x-rays personally reviewed and interpreted, agree no fracture or hip dislocation. Per Patient, CT showed partial tear.   COGNITION: Overall cognitive status: Within functional limits for tasks assessed     SENSATION: Not tested  EDEMA:  Not anymore  MUSCLE LENGTH: Hamstrings: Right 64 deg; Left 68 deg  POSTURE: rounded shoulders and weight shift left  PALPATION: Mildly TTP at hamstring origin both med and lateral, ITB on R  LOWER EXTREMITY ROM:  PROM generally WFL, limited due to multiple joint replacements.   LOWER EXTREMITY MMT: LLE strength 5/5,   MMT Right eval Left eval  Hip flexion 4   Hip extension 3-   Hip abduction    Hip adduction    Hip internal rotation     Hip external rotation    Knee flexion 3+   Knee extension 3+   Ankle dorsiflexion 4   Ankle plantarflexion    Ankle inversion    Ankle eversion     (Blank rows = not tested)  FUNCTIONAL TESTS:  5 times sit to stand: 9.58 Timed up and go (TUG): 26.24 2 minute walk test: TBD  GAIT: Distance walked: 16' Assistive device utilized: Crutches Level of assistance: Modified independence Comments: Step through gait, partial weight through RLE, slow, cautious   TODAY'S TREATMENT:  DATE:  12/13/21 Education, HEP    PATIENT EDUCATION:  Education details: POC, HEP Person educated: Patient Education method: Customer service manager Education comprehension: verbalized understanding and returned demonstration  HOME EXERCISE PROGRAM: Verbal- LAQ, and all of her back exercises previously provided.  ASSESSMENT:  CLINICAL IMPRESSION: Patient is a 76 y.o. who was seen today for physical therapy evaluation and treatment for R hamstring tendon rupture. The tear is not complete, occurred 3 weeks ago   OBJECTIVE IMPAIRMENTS: Abnormal gait, decreased balance, decreased coordination, decreased endurance, difficulty walking, decreased ROM, decreased strength, impaired flexibility, postural dysfunction, and pain.   ACTIVITY LIMITATIONS: carrying, lifting, bending, sitting, standing, squatting, stairs, and locomotion level  PARTICIPATION LIMITATIONS: meal prep, cleaning, laundry, driving, shopping, and yard work  PERSONAL FACTORS: Age and Past/current experiences are also affecting patient's functional outcome.   REHAB POTENTIAL: Good  CLINICAL DECISION MAKING: Evolving/moderate complexity  EVALUATION COMPLEXITY: Moderate   GOALS: Goals reviewed with patient? Yes  SHORT TERM GOALS: Target date: 01/10/2022  I with basic HEP Baseline: Goal status:  INITIAL  LONG TERM GOALS: Target date: 03/07/2022   I with final HEP Baseline:  Goal status: INITIAL  2.  Complete TUG in < 12 sec Baseline: 26.24 Goal status: INITIAL  3.  Functional gait will improve to using no AD, no abnormalities. Baseline: crutches with decreased WB through RLE Goal status: INITIAL  4.  Increase R hip strength to at least 4/5 Baseline: 3 Goal status: INITIAL  5.  Patient will be able to complete all daily tasks without pain in R hip Baseline:  Goal status: INITIAL PLAN:  PT FREQUENCY: 2x/week  PT DURATION: 12 weeks  PLANNED INTERVENTIONS: Therapeutic exercises, Therapeutic activity, Neuromuscular re-education, Balance training, Gait training, Patient/Family education, Self Care, Joint mobilization, Stair training, Dry Needling, Cryotherapy, Moist heat, Ionotophoresis '4mg'$ /ml Dexamethasone, and Manual therapy  PLAN FOR NEXT SESSION: assess for cane, 2 min walk test   Marcelina Morel, DPT 12/13/2021, 11:58 AM

## 2021-12-19 ENCOUNTER — Ambulatory Visit: Payer: Medicare Other | Attending: Orthopedic Surgery | Admitting: Physical Therapy

## 2021-12-19 ENCOUNTER — Encounter: Payer: Self-pay | Admitting: Physical Therapy

## 2021-12-19 DIAGNOSIS — R278 Other lack of coordination: Secondary | ICD-10-CM

## 2021-12-19 DIAGNOSIS — M6281 Muscle weakness (generalized): Secondary | ICD-10-CM | POA: Diagnosis present

## 2021-12-19 DIAGNOSIS — R262 Difficulty in walking, not elsewhere classified: Secondary | ICD-10-CM | POA: Diagnosis not present

## 2021-12-19 DIAGNOSIS — M25551 Pain in right hip: Secondary | ICD-10-CM | POA: Diagnosis present

## 2021-12-19 DIAGNOSIS — R2681 Unsteadiness on feet: Secondary | ICD-10-CM | POA: Insufficient documentation

## 2021-12-19 NOTE — Therapy (Signed)
OUTPATIENT PHYSICAL THERAPY LOWER EXTREMITY EVALUATION   Patient Name: [name] MRN: [id number] DOB:04-11-1945, 76 y.o., female Today's Date: 12/19/2021   PT End of Session - 12/19/21 1303     Visit Number 2    Date for PT Re-Evaluation 03/07/22    PT Start Time 1300    PT Stop Time 1345    PT Time Calculation (min) 45 min    Activity Tolerance Patient tolerated treatment well    Behavior During Therapy [facility] for tasks assessed/performed             Past Medical History:  Diagnosis Date   Allergic rhinitis    Anxiety    Chronic venous insufficiency    Costochondritis 04/13/2017   DDD (degenerative disc disease), cervical    DDD (degenerative disc disease), lumbar    History of colon polyps    Hyperlipidemia    Hypertension    Hypothyroidism    Insomnia    Liver cyst    Liver nodule    OA (osteoarthritis)    Hip and knee   Obese    Pneumonia    hx   Restless legs syndrome (RLS)    Rotator cuff tear arthropathy, right    Squamous cell carcinoma of left thigh    Urge incontinence of urine    Past Surgical History:  Procedure Laterality Date   APPENDECTOMY     BLADDER SUSPENSION  2011   BREAST CYST EXCISION Left    over 50 years ago   COLONOSCOPY     JOINT REPLACEMENT     left hip replacment  revision 10 yrs ago, replacement done 10 yrs prior to revision   02   REVERSE SHOULDER ARTHROPLASTY Right 05/14/2017   Procedure: RIGHT REVERSE SHOULDER ARTHROPLASTY;  Surgeon: Justice Britain, MD;  Location: [place];  Service: Orthopedics;  Laterality: Right;   REVERSE SHOULDER ARTHROPLASTY Left 07/30/2017   Procedure: REVERSE SHOULDER ARTHROPLASTY;  Surgeon: Justice Britain, MD;  Location: [place];  Service: Orthopedics;  Laterality: Left;   right knee replacment  08   SACROILIAC JOINT FUSION Right 12/27/2014   Procedure: RIGHT SACROILIAC JOINT FUSION;  Surgeon: Melina Schools, MD;  Location: [place];  Service: Orthopedics;  Laterality: Right;  right sacral fusion    TONSILLECTOMY     TOTAL HIP ARTHROPLASTY  09/08/2011   Procedure: TOTAL HIP ARTHROPLASTY;  Surgeon: Gearlean Alf, MD;  Location: [facility];  Service: Orthopedics;  Laterality: Right;   TOTAL KNEE ARTHROPLASTY Left 02/26/2018   Procedure: LEFT TOTAL KNEE ARTHROPLASTY;  Surgeon: Mcarthur Rossetti, MD;  Location: [facility];  Service: Orthopedics;  Laterality: Left;   WISDOM TOOTH EXTRACTION     Patient Active Problem List   Diagnosis Date Noted   Status post total left knee replacement 02/26/2018   Unilateral primary osteoarthritis, left knee 02/01/2018   S/p reverse total shoulder arthroplasty 05/14/2017   SI (sacroiliac) pain 12/27/2014   Postop Hyponatremia 09/10/2011   OA (osteoarthritis) of hip 09/08/2011    PCP: Kristopher Glee  REFERRING PROVIDER: Gareth Morgan, MD   REFERRING DIAG: Diagnosis [phone] (ICD-10-CM) - Strain of muscle, fascia and tendon of the posterior muscle group at thigh level, right thigh, initial encounter   THERAPY DIAG:  Difficulty in walking, not elsewhere classified  Muscle weakness (generalized)  Other lack of coordination  Rationale for Evaluation and Treatment: Rehabilitation  ONSET DATE: 12/02/2021   SUBJECTIVE:   SUBJECTIVE STATEMENT: " I feel fine"  PERTINENT HISTORY: 1.  Hamstring tendon rupture, right, initial encounter   Plan:   1. Clinically the patient's physical exam and history today are most suggestive of a hamstring insertional injury. She appears to have intact strength and does not appear to have significant limitation associated with this. Given the patient's age and other medical issues initial treatment would be nonoperative. We have suggested the patient begin exercise we will refer to physical therapy if requested. She will follow-up with [place] in 4 to 6 weeks if her symptoms persist. If her symptoms otherwise improved she can follow-up on an as-needed basis History of Present Illness: [name] is a 76 y.o.  female who presents with right hip pain for which Orthopaedic evaluation was requested.  Patient has a complicated orthopedic history. She has previously been seen at [facility] health as well as at [facility] She has had an SI joint fusion on 1 side. She has bilateral had bilateral total hip arthroplasties. She comes in today complaining of pain near the hamstring insertion onto her pelvis.  IPAIN:  Are you having pain? Yes: NPRS scale: 0/10 Pain location: back of leg Pain description: sharp Aggravating factors: Using the muscle Relieving factors: crutches, rest  PRECAUTIONS: None  WEIGHT BEARING RESTRICTIONS: No  FALLS:  Has patient fallen in last 6 months? Yes. Number of falls 1  LIVING ENVIRONMENT: Lives with: lives with their family Lives in: House/apartment Stairs: Yes: External: 1 steps; none Has following equipment at home: Crutches and Wheelchair (manual)  OCCUPATION: N/A,   PLOF: Independent  PATIENT GOALS: To be able to walk without pain, not re-injure her tendon   OBJECTIVE:   DIAGNOSTIC FINDINGS: nitial impression: fall, hip pain   3:14 PM x-rays personally reviewed and interpreted, agree no fracture or hip dislocation. Per Patient, CT showed partial tear.   COGNITION: Overall cognitive status: Within functional limits for tasks assessed     SENSATION: Not tested  EDEMA:  Not anymore  MUSCLE LENGTH: Hamstrings: Right 64 deg; Left 68 deg  POSTURE: rounded shoulders and weight shift left  PALPATION: Mildly TTP at hamstring origin both med and lateral, ITB on R  LOWER EXTREMITY ROM:  PROM generally WFL, limited due to multiple joint replacements.   LOWER EXTREMITY MMT: LLE strength 5/5,   MMT Right eval Left eval  Hip flexion 4   Hip extension 3-   Hip abduction    Hip adduction    Hip internal rotation    Hip external rotation    Knee flexion 3+   Knee extension 3+   Ankle dorsiflexion 4   Ankle plantarflexion    Ankle inversion     Ankle eversion     (Blank rows = not tested)  FUNCTIONAL TESTS:  5 times sit to stand: 9.58 Timed up and go (TUG): 26.24 2 minute walk test: TBD  GAIT: Distance walked: 55' Assistive device utilized: Crutches Level of assistance: Modified independence Comments: Step through gait, partial weight through RLE, slow, cautious   TODAY'S TREATMENT:  DATE:  12/19/21 NuStep L4 x 6 min  S2S 2x10 Light HS stretching Resisted gait 20lb 4 way x 3 each  4in step ups x10 each Leg Ext 5lb 2x10    12/13/21 Education, HEP    PATIENT EDUCATION:  Education details: POC, HEP Person educated: Patient Education method: Customer service manager Education comprehension: verbalized understanding and returned demonstration  HOME EXERCISE PROGRAM: Verbal- LAQ, and all of her back exercises previously provided.  ASSESSMENT:  CLINICAL IMPRESSION: Patient seen today for physical therapy treatment for R hamstring tendon rupture. The tear is not complete, occurred 3 weeks ago. Pt abel to complete functional interventions that did not directly stress hamstring muscle. Pt reports she could feel it with leg extensions. Pt ambulated during session without AD.   OBJECTIVE IMPAIRMENTS: Abnormal gait, decreased balance, decreased coordination, decreased endurance, difficulty walking, decreased ROM, decreased strength, impaired flexibility, postural dysfunction, and pain.   ACTIVITY LIMITATIONS: carrying, lifting, bending, sitting, standing, squatting, stairs, and locomotion level  PARTICIPATION LIMITATIONS: meal prep, cleaning, laundry, driving, shopping, and yard work  PERSONAL FACTORS: Age and Past/current experiences are also affecting patient's functional outcome.   REHAB POTENTIAL: Good  CLINICAL DECISION MAKING: Evolving/moderate complexity  EVALUATION COMPLEXITY:  Moderate   GOALS: Goals reviewed with patient? Yes  SHORT TERM GOALS: Target date: 01/10/2022  I with basic HEP Baseline: Goal status: INITIAL  LONG TERM GOALS: Target date: 03/07/2022   I with final HEP Baseline:  Goal status: INITIAL  2.  Complete TUG in < 12 sec Baseline: 26.24 Goal status: INITIAL  3.  Functional gait will improve to using no AD, no abnormalities. Baseline: crutches with decreased WB through RLE Goal status: INITIAL  4.  Increase R hip strength to at least 4/5 Baseline: 3 Goal status: INITIAL  5.  Patient will be able to complete all daily tasks without pain in R hip Baseline:  Goal status: INITIAL PLAN:  PT FREQUENCY: 2x/week  PT DURATION: 12 weeks  PLANNED INTERVENTIONS: Therapeutic exercises, Therapeutic activity, Neuromuscular re-education, Balance training, Gait training, Patient/Family education, Self Care, Joint mobilization, Stair training, Dry Needling, Cryotherapy, Moist heat, Ionotophoresis '4mg'$ /ml Dexamethasone, and Manual therapy  PLAN FOR NEXT SESSION: assess for cane, 2 min walk test   Cheri Fowler, PTA 12/19/2021, 1:08 PM

## 2021-12-23 ENCOUNTER — Ambulatory Visit: Payer: Medicare Other | Admitting: Physical Therapy

## 2021-12-23 DIAGNOSIS — R278 Other lack of coordination: Secondary | ICD-10-CM

## 2021-12-23 DIAGNOSIS — M25551 Pain in right hip: Secondary | ICD-10-CM

## 2021-12-23 DIAGNOSIS — R262 Difficulty in walking, not elsewhere classified: Secondary | ICD-10-CM

## 2021-12-23 DIAGNOSIS — M6281 Muscle weakness (generalized): Secondary | ICD-10-CM

## 2021-12-23 DIAGNOSIS — R2681 Unsteadiness on feet: Secondary | ICD-10-CM

## 2021-12-23 NOTE — Therapy (Signed)
OUTPATIENT PHYSICAL THERAPY LOWER EXTREMITY EVALUATION   Patient Name: [name] MRN: [id number] DOB:Nov 10, 1945, 76 y.o., female Today's Date: 12/23/2021   PT End of Session - 12/23/21 1235     Visit Number 3    Date for PT Re-Evaluation 03/07/22    PT Start Time 5    PT Stop Time 1310    PT Time Calculation (min) 40 min    Activity Tolerance Patient tolerated treatment well    Behavior During Therapy [facility] for tasks assessed/performed             Past Medical History:  Diagnosis Date   Allergic rhinitis    Anxiety    Chronic venous insufficiency    Costochondritis 04/13/2017   DDD (degenerative disc disease), cervical    DDD (degenerative disc disease), lumbar    History of colon polyps    Hyperlipidemia    Hypertension    Hypothyroidism    Insomnia    Liver cyst    Liver nodule    OA (osteoarthritis)    Hip and knee   Obese    Pneumonia    hx   Restless legs syndrome (RLS)    Rotator cuff tear arthropathy, right    Squamous cell carcinoma of left thigh    Urge incontinence of urine    Past Surgical History:  Procedure Laterality Date   APPENDECTOMY     BLADDER SUSPENSION  2011   BREAST CYST EXCISION Left    over 50 years ago   COLONOSCOPY     JOINT REPLACEMENT     left hip replacment  revision 10 yrs ago, replacement done 10 yrs prior to revision   02   REVERSE SHOULDER ARTHROPLASTY Right 05/14/2017   Procedure: RIGHT REVERSE SHOULDER ARTHROPLASTY;  Surgeon: Justice Britain, MD;  Location: [place];  Service: Orthopedics;  Laterality: Right;   REVERSE SHOULDER ARTHROPLASTY Left 07/30/2017   Procedure: REVERSE SHOULDER ARTHROPLASTY;  Surgeon: Justice Britain, MD;  Location: [place];  Service: Orthopedics;  Laterality: Left;   right knee replacment  08   SACROILIAC JOINT FUSION Right 12/27/2014   Procedure: RIGHT SACROILIAC JOINT FUSION;  Surgeon: Melina Schools, MD;  Location: [place];  Service: Orthopedics;  Laterality: Right;  right sacral fusion    TONSILLECTOMY     TOTAL HIP ARTHROPLASTY  09/08/2011   Procedure: TOTAL HIP ARTHROPLASTY;  Surgeon: Gearlean Alf, MD;  Location: [facility];  Service: Orthopedics;  Laterality: Right;   TOTAL KNEE ARTHROPLASTY Left 02/26/2018   Procedure: LEFT TOTAL KNEE ARTHROPLASTY;  Surgeon: Mcarthur Rossetti, MD;  Location: [facility];  Service: Orthopedics;  Laterality: Left;   WISDOM TOOTH EXTRACTION     Patient Active Problem List   Diagnosis Date Noted   Status post total left knee replacement 02/26/2018   Unilateral primary osteoarthritis, left knee 02/01/2018   S/p reverse total shoulder arthroplasty 05/14/2017   SI (sacroiliac) pain 12/27/2014   Postop Hyponatremia 09/10/2011   OA (osteoarthritis) of hip 09/08/2011    PCP: Kristopher Glee  REFERRING PROVIDER: Gareth Morgan, MD   REFERRING DIAG: Diagnosis [phone] (ICD-10-CM) - Strain of muscle, fascia and tendon of the posterior muscle group at thigh level, right thigh, initial encounter   THERAPY DIAG:  Difficulty in walking, not elsewhere classified  Muscle weakness (generalized)  Other lack of coordination  Pain in right hip  Unsteadiness on feet  Rationale for Evaluation and Treatment: Rehabilitation  ONSET DATE: 12/02/2021   SUBJECTIVE:   SUBJECTIVE  STATEMENT: Patient reports no new issues. Arrives walking without AD.  PERTINENT HISTORY: 1. Hamstring tendon rupture, right, initial encounter   Plan:   1. Clinically the patient's physical exam and history today are most suggestive of a hamstring insertional injury. She appears to have intact strength and does not appear to have significant limitation associated with this. Given the patient's age and other medical issues initial treatment would be nonoperative. We have suggested the patient begin exercise we will refer to physical therapy if requested. She will follow-up with [place] in 4 to 6 weeks if her symptoms persist. If her symptoms otherwise improved she can  follow-up on an as-needed basis History of Present Illness: [name] is a 76 y.o. female who presents with right hip pain for which Orthopaedic evaluation was requested.  Patient has a complicated orthopedic history. She has previously been seen at [facility] health as well as at [facility]. She has had an SI joint fusion on 1 side. She has bilateral had bilateral total hip arthroplasties. She comes in today complaining of pain near the hamstring insertion onto her pelvis.  IPAIN:  Are you having pain? Yes: NPRS scale: 0/10 Pain location: back of leg Pain description: sharp Aggravating factors: Using the muscle Relieving factors: crutches, rest  PRECAUTIONS: None  WEIGHT BEARING RESTRICTIONS: No  FALLS:  Has patient fallen in last 6 months? Yes. Number of falls 1  LIVING ENVIRONMENT: Lives with: lives with their family Lives in: House/apartment Stairs: Yes: External: 1 steps; none Has following equipment at home: Crutches and Wheelchair (manual)  OCCUPATION: N/A,   PLOF: Independent  PATIENT GOALS: To be able to walk without pain, not re-injure her tendon   OBJECTIVE:   DIAGNOSTIC FINDINGS: nitial impression: fall, hip pain   3:14 PM x-rays personally reviewed and interpreted, agree no fracture or hip dislocation. Per Patient, CT showed partial tear.   COGNITION: Overall cognitive status: Within functional limits for tasks assessed     SENSATION: Not tested  EDEMA:  Not anymore  MUSCLE LENGTH: Hamstrings: Right 64 deg; Left 68 deg  POSTURE: rounded shoulders and weight shift left  PALPATION: Mildly TTP at hamstring origin both med and lateral, ITB on R  LOWER EXTREMITY ROM:  PROM generally WFL, limited due to multiple joint replacements.   LOWER EXTREMITY MMT: LLE strength 5/5,   MMT Right eval Left eval  Hip flexion 4   Hip extension 3-   Hip abduction    Hip adduction    Hip internal rotation    Hip external rotation    Knee flexion 3+   Knee  extension 3+   Ankle dorsiflexion 4   Ankle plantarflexion    Ankle inversion    Ankle eversion     (Blank rows = not tested)  FUNCTIONAL TESTS:  5 times sit to stand: 9.58 Timed up and go (TUG): 26.24 2 minute walk test: TBD  GAIT: Distance walked: 21' Assistive device utilized: Crutches Level of assistance: Modified independence Comments: Step through gait, partial weight through RLE, slow, cautious   TODAY'S TREATMENT:  DATE:  12/23/21 NuStep L4 x 6 minutes Supine stretch to R hamstring, active and passive, very cautious Supine bridge, clamshells and ball squeeze x 10 each STM to R prox hamstring tendon F/B gentle stretch Standing min squats x 10, VC to remain centered. Seated long kicks, slow, with 5 sec hold for HS stretch Knee flexion against G tband, 10 reps each.   12/19/21 NuStep L4 x 6 min  S2S 2x10 Light HS stretching Resisted gait 20lb 4 way x 3 each  4in step ups x10 each Leg Ext 5lb 2x10    12/13/21 Education, HEP    PATIENT EDUCATION:  Education details: POC, HEP Person educated: Patient Education method: Customer service manager Education comprehension: verbalized understanding and returned demonstration  HOME EXERCISE PROGRAM:  [id number]  ASSESSMENT:  CLINICAL IMPRESSION: Patient reports no new issues today, tolerated updated HEP with increased stretch and strengthening, gentle progressions to slowly increase stress to hamstring. No Complaints.  OBJECTIVE IMPAIRMENTS: Abnormal gait, decreased balance, decreased coordination, decreased endurance, difficulty walking, decreased ROM, decreased strength, impaired flexibility, postural dysfunction, and pain.   ACTIVITY LIMITATIONS: carrying, lifting, bending, sitting, standing, squatting, stairs, and locomotion level  PARTICIPATION LIMITATIONS: meal prep, cleaning,  laundry, driving, shopping, and yard work  PERSONAL FACTORS: Age and Past/current experiences are also affecting patient's functional outcome.   REHAB POTENTIAL: Good  CLINICAL DECISION MAKING: Evolving/moderate complexity  EVALUATION COMPLEXITY: Moderate   GOALS: Goals reviewed with patient? Yes  SHORT TERM GOALS: Target date: 01/10/2022  I with basic HEP Baseline: Goal status: ongoing  LONG TERM GOALS: Target date: 03/07/2022   I with final HEP Baseline:  Goal status: INITIAL  2.  Complete TUG in < 12 sec Baseline: 26.24 Goal status: INITIAL  3.  Functional gait will improve to using no AD, no abnormalities. Baseline: crutches with decreased WB through RLE Goal status: INITIAL  4.  Increase R hip strength to at least 4/5 Baseline: 3 Goal status: ongoing  5.  Patient will be able to complete all daily tasks without pain in R hip Baseline:  Goal status: INITIAL PLAN:  PT FREQUENCY: 2x/week  PT DURATION: 12 weeks  PLANNED INTERVENTIONS: Therapeutic exercises, Therapeutic activity, Neuromuscular re-education, Balance training, Gait training, Patient/Family education, Self Care, Joint mobilization, Stair training, Dry Needling, Cryotherapy, Moist heat, Ionotophoresis '4mg'$ /ml Dexamethasone, and Manual therapy  PLAN FOR NEXT SESSION: Assess tolerance to Hep and update as appropriate.  Ethel Rana DPT 12/23/21 1:11 PM  12/23/2021, 1:11 PM

## 2021-12-26 ENCOUNTER — Ambulatory Visit: Payer: Medicare Other | Admitting: Physical Therapy

## 2021-12-26 ENCOUNTER — Encounter: Payer: Self-pay | Admitting: Physical Therapy

## 2021-12-26 DIAGNOSIS — R262 Difficulty in walking, not elsewhere classified: Secondary | ICD-10-CM | POA: Diagnosis not present

## 2021-12-26 DIAGNOSIS — M6281 Muscle weakness (generalized): Secondary | ICD-10-CM

## 2021-12-26 DIAGNOSIS — R278 Other lack of coordination: Secondary | ICD-10-CM

## 2021-12-26 NOTE — Therapy (Signed)
OUTPATIENT PHYSICAL THERAPY LOWER EXTREMITY EVALUATION   Patient Name: [name] MRN: [id number] DOB:[birth year], 76 y.o., female Today's Date: 12/26/2021   PT End of Session - 12/26/21 1600     Visit Number 4    Date for PT Re-Evaluation 03/07/22    PT Start Time 1600    PT Stop Time 1645    PT Time Calculation (min) 45 min    Activity Tolerance Patient tolerated treatment well    Behavior During Therapy [facility] for tasks assessed/performed             Past Medical History:  Diagnosis Date   Allergic rhinitis    Anxiety    Chronic venous insufficiency    Costochondritis 04/13/2017   DDD (degenerative disc disease), cervical    DDD (degenerative disc disease), lumbar    History of colon polyps    Hyperlipidemia    Hypertension    Hypothyroidism    Insomnia    Liver cyst    Liver nodule    OA (osteoarthritis)    Hip and knee   Obese    Pneumonia    hx   Restless legs syndrome (RLS)    Rotator cuff tear arthropathy, right    Squamous cell carcinoma of left thigh    Urge incontinence of urine    Past Surgical History:  Procedure Laterality Date   APPENDECTOMY     BLADDER SUSPENSION  2011   BREAST CYST EXCISION Left    over 50 years ago   COLONOSCOPY     JOINT REPLACEMENT     left hip replacment  revision 10 yrs ago, replacement done 10 yrs prior to revision   02   REVERSE SHOULDER ARTHROPLASTY Right 05/14/2017   Procedure: RIGHT REVERSE SHOULDER ARTHROPLASTY;  Surgeon: Justice Britain, MD;  Location: [place];  Service: Orthopedics;  Laterality: Right;   REVERSE SHOULDER ARTHROPLASTY Left 07/30/2017   Procedure: REVERSE SHOULDER ARTHROPLASTY;  Surgeon: Justice Britain, MD;  Location: [place];  Service: Orthopedics;  Laterality: Left;   right knee replacment  08   SACROILIAC JOINT FUSION Right 12/27/2014   Procedure: RIGHT SACROILIAC JOINT FUSION;  Surgeon: Melina Schools, MD;  Location: [place];  Service: Orthopedics;  Laterality: Right;  right sacral fusion    TONSILLECTOMY     TOTAL HIP ARTHROPLASTY  09/08/2011   Procedure: TOTAL HIP ARTHROPLASTY;  Surgeon: Gearlean Alf, MD;  Location: [facility];  Service: Orthopedics;  Laterality: Right;   TOTAL KNEE ARTHROPLASTY Left 02/26/2018   Procedure: LEFT TOTAL KNEE ARTHROPLASTY;  Surgeon: Mcarthur Rossetti, MD;  Location: [facility];  Service: Orthopedics;  Laterality: Left;   WISDOM TOOTH EXTRACTION     Patient Active Problem List   Diagnosis Date Noted   Status post total left knee replacement 02/26/2018   Unilateral primary osteoarthritis, left knee 02/01/2018   S/p reverse total shoulder arthroplasty 05/14/2017   SI (sacroiliac) pain 12/27/2014   Postop Hyponatremia 09/10/2011   OA (osteoarthritis) of hip 09/08/2011    PCP: Kristopher Glee  REFERRING PROVIDER: Gareth Morgan, MD   REFERRING DIAG: Diagnosis [phone] (ICD-10-CM) - Strain of muscle, fascia and tendon of the posterior muscle group at thigh level, right thigh, initial encounter   THERAPY DIAG:  Difficulty in walking, not elsewhere classified  Other lack of coordination  Muscle weakness (generalized)  Rationale for Evaluation and Treatment: Rehabilitation  ONSET DATE: 12/02/2021   SUBJECTIVE:   SUBJECTIVE STATEMENT: Feeling fine  PERTINENT HISTORY: 1. Hamstring tendon  rupture, right, initial encounter   Plan:   1. Clinically the patient's physical exam and history today are most suggestive of a hamstring insertional injury. She appears to have intact strength and does not appear to have significant limitation associated with this. Given the patient's age and other medical issues initial treatment would be nonoperative. We have suggested the patient begin exercise we will refer to physical therapy if requested. She will follow-up with [place] in 4 to 6 weeks if her symptoms persist. If her symptoms otherwise improved she can follow-up on an as-needed basis History of Present Illness: [name] is a 76 y.o.  female who presents with right hip pain for which Orthopaedic evaluation was requested.  Patient has a complicated orthopedic history. She has previously been seen at [facility] as well as at [facility]. She has had an SI joint fusion on 1 side. She has bilateral had bilateral total hip arthroplasties. She comes in today complaining of pain near the hamstring insertion onto her pelvis.  IPAIN:  Are you having pain? Yes: NPRS scale: 0/10 Pain location: back of leg Pain description: sharp Aggravating factors: Using the muscle Relieving factors: crutches, rest  PRECAUTIONS: None  WEIGHT BEARING RESTRICTIONS: No  FALLS:  Has patient fallen in last 6 months? Yes. Number of falls 1  LIVING ENVIRONMENT: Lives with: lives with their family Lives in: House/apartment Stairs: Yes: External: 1 steps; none Has following equipment at home: Crutches and Wheelchair (manual)  OCCUPATION: N/A,   PLOF: Independent  PATIENT GOALS: To be able to walk without pain, not re-injure her tendon   OBJECTIVE:   DIAGNOSTIC FINDINGS: nitial impression: fall, hip pain   3:14 PM x-rays personally reviewed and interpreted, agree no fracture or hip dislocation. Per Patient, CT showed partial tear.   COGNITION: Overall cognitive status: Within functional limits for tasks assessed     SENSATION: Not tested  EDEMA:  Not anymore  MUSCLE LENGTH: Hamstrings: Right 64 deg; Left 68 deg  POSTURE: rounded shoulders and weight shift left  PALPATION: Mildly TTP at hamstring origin both med and lateral, ITB on R  LOWER EXTREMITY ROM:  PROM generally WFL, limited due to multiple joint replacements.   LOWER EXTREMITY MMT: LLE strength 5/5,   MMT Right eval Left eval  Hip flexion 4   Hip extension 3-   Hip abduction    Hip adduction    Hip internal rotation    Hip external rotation    Knee flexion 3+   Knee extension 3+   Ankle dorsiflexion 4   Ankle plantarflexion    Ankle inversion     Ankle eversion     (Blank rows = not tested)  FUNCTIONAL TESTS:  5 times sit to stand: 9.58 Timed up and go (TUG): 26.24 2 minute walk test: TBD  GAIT: Distance walked: 25' Assistive device utilized: Crutches Level of assistance: Modified independence Comments: Step through gait, partial weight through RLE, slow, cautious   TODAY'S TREATMENT:  DATE:  11/25/21 NuStep L5 x 6 min  Forward step ups 6in x10 each Lateal step ups 6in x10 each S2S Airex 2x10 Ball squeezes 2x10 Hip abd green 2x10 Supine stretch to R hamstring with STM, very cautious  12/23/21 NuStep L4 x 6 minutes Supine stretch to R hamstring, active and passive, very cautious Supine bridge, clamshells and ball squeeze x 10 each STM to R prox hamstring tendon F/B gentle stretch Standing min squats x 10, VC to remain centered. Seated long kicks, slow, with 5 sec hold for HS stretch Knee flexion against G tband, 10 reps each.   12/19/21 NuStep L4 x 6 min  S2S 2x10 Light HS stretching Resisted gait 20lb 4 way x 3 each  4in step ups x10 each Leg Ext 5lb 2x10    12/13/21 Education, HEP    PATIENT EDUCATION:  Education details: POC, HEP Person educated: Patient Education method: Customer service manager Education comprehension: verbalized understanding and returned demonstration  HOME EXERCISE PROGRAM:  [id number]  ASSESSMENT:  CLINICAL IMPRESSION: Patient reports no new issues today,  gentle progressions to slowly increase stress to hamstring. Some tenderness reported with wight stretching and STM. Knee discomfort with sit to stands.  OBJECTIVE IMPAIRMENTS: Abnormal gait, decreased balance, decreased coordination, decreased endurance, difficulty walking, decreased ROM, decreased strength, impaired flexibility, postural dysfunction, and pain.   ACTIVITY LIMITATIONS: carrying,  lifting, bending, sitting, standing, squatting, stairs, and locomotion level  PARTICIPATION LIMITATIONS: meal prep, cleaning, laundry, driving, shopping, and yard work  PERSONAL FACTORS: Age and Past/current experiences are also affecting patient's functional outcome.   REHAB POTENTIAL: Good  CLINICAL DECISION MAKING: Evolving/moderate complexity  EVALUATION COMPLEXITY: Moderate   GOALS: Goals reviewed with patient? Yes  SHORT TERM GOALS: Target date: 01/10/2022  I with basic HEP Baseline: Goal status: ongoing  LONG TERM GOALS: Target date: 03/07/2022   I with final HEP Baseline:  Goal status: INITIAL  2.  Complete TUG in < 12 sec Baseline: 26.24 Goal status: INITIAL  3.  Functional gait will improve to using no AD, no abnormalities. Baseline: crutches with decreased WB through RLE Goal status: INITIAL  4.  Increase R hip strength to at least 4/5 Baseline: 3 Goal status: ongoing  5.  Patient will be able to complete all daily tasks without pain in R hip Baseline:  Goal status: INITIAL PLAN:  PT FREQUENCY: 2x/week  PT DURATION: 12 weeks  PLANNED INTERVENTIONS: Therapeutic exercises, Therapeutic activity, Neuromuscular re-education, Balance training, Gait training, Patient/Family education, Self Care, Joint mobilization, Stair training, Dry Needling, Cryotherapy, Moist heat, Ionotophoresis '4mg'$ /ml Dexamethasone, and Manual therapy  PLAN FOR NEXT SESSION: Assess tolerance to Hep and update as appropriate.  Cheri Fowler, PTA  12/26/21 4:01 PM  12/26/2021, 4:01 PM

## 2021-12-30 ENCOUNTER — Encounter: Payer: Self-pay | Admitting: Physical Therapy

## 2021-12-30 ENCOUNTER — Ambulatory Visit: Payer: Medicare Other | Admitting: Physical Therapy

## 2021-12-30 DIAGNOSIS — R262 Difficulty in walking, not elsewhere classified: Secondary | ICD-10-CM | POA: Diagnosis not present

## 2021-12-30 DIAGNOSIS — R278 Other lack of coordination: Secondary | ICD-10-CM

## 2021-12-30 DIAGNOSIS — M6281 Muscle weakness (generalized): Secondary | ICD-10-CM

## 2021-12-30 NOTE — Therapy (Signed)
OUTPATIENT PHYSICAL THERAPY LOWER EXTREMITY EVALUATION   Patient Name: [name] MRN: [id number] DOB:1945-12-11, 76 y.o., female Today's Date: 12/30/2021   PT End of Session - 12/30/21 1015     Visit Number 5    Date for PT Re-Evaluation 03/07/22    PT Start Time 1015    PT Stop Time 1100    PT Time Calculation (min) 45 min    Activity Tolerance Patient tolerated treatment well    Behavior During Therapy [facility] for tasks assessed/performed             Past Medical History:  Diagnosis Date   Allergic rhinitis    Anxiety    Chronic venous insufficiency    Costochondritis 04/13/2017   DDD (degenerative disc disease), cervical    DDD (degenerative disc disease), lumbar    History of colon polyps    Hyperlipidemia    Hypertension    Hypothyroidism    Insomnia    Liver cyst    Liver nodule    OA (osteoarthritis)    Hip and knee   Obese    Pneumonia    hx   Restless legs syndrome (RLS)    Rotator cuff tear arthropathy, right    Squamous cell carcinoma of left thigh    Urge incontinence of urine    Past Surgical History:  Procedure Laterality Date   APPENDECTOMY     BLADDER SUSPENSION  2011   BREAST CYST EXCISION Left    over 50 years ago   COLONOSCOPY     JOINT REPLACEMENT     left hip replacment  revision 10 yrs ago, replacement done 10 yrs prior to revision   02   REVERSE SHOULDER ARTHROPLASTY Right 05/14/2017   Procedure: RIGHT REVERSE SHOULDER ARTHROPLASTY;  Surgeon: Justice Britain, MD;  Location: [place];  Service: Orthopedics;  Laterality: Right;   REVERSE SHOULDER ARTHROPLASTY Left 07/30/2017   Procedure: REVERSE SHOULDER ARTHROPLASTY;  Surgeon: Justice Britain, MD;  Location: [place];  Service: Orthopedics;  Laterality: Left;   right knee replacment  08   SACROILIAC JOINT FUSION Right 12/27/2014   Procedure: RIGHT SACROILIAC JOINT FUSION;  Surgeon: Melina Schools, MD;  Location: [place];  Service: Orthopedics;  Laterality: Right;  right sacral fusion    TONSILLECTOMY     TOTAL HIP ARTHROPLASTY  09/08/2011   Procedure: TOTAL HIP ARTHROPLASTY;  Surgeon: Gearlean Alf, MD;  [facility];  Service: Orthopedics;  Laterality: Right;   TOTAL KNEE ARTHROPLASTY Left 02/26/2018   Procedure: LEFT TOTAL KNEE ARTHROPLASTY;  Surgeon: Mcarthur Rossetti, MD;  [facility];  Service: Orthopedics;  Laterality: Left;   WISDOM TOOTH EXTRACTION     Patient Active Problem List   Diagnosis Date Noted   Status post total left knee replacement 02/26/2018   Unilateral primary osteoarthritis, left knee 02/01/2018   S/p reverse total shoulder arthroplasty 05/14/2017   SI (sacroiliac) pain 12/27/2014   Postop Hyponatremia 09/10/2011   OA (osteoarthritis) of hip 09/08/2011    PCP: Kristopher Glee  REFERRING PROVIDER: Gareth Morgan, MD   REFERRING DIAG: Diagnosis [phone] (ICD-10-CM) - Strain of muscle, fascia and tendon of the posterior muscle group at thigh level, right thigh, initial encounter   THERAPY DIAG:  Difficulty in walking, not elsewhere classified  Other lack of coordination  Muscle weakness (generalized)  Rationale for Evaluation and Treatment: Rehabilitation  ONSET DATE: 12/02/2021   SUBJECTIVE:   SUBJECTIVE STATEMENT: "Im fine"   PERTINENT HISTORY: 1. Hamstring  tendon rupture, right, initial encounter   Plan:   1. Clinically the patient's physical exam and history today are most suggestive of a hamstring insertional injury. She appears to have intact strength and does not appear to have significant limitation associated with this. Given the patient's age and other medical issues initial treatment would be nonoperative. We have suggested the patient begin exercise we will refer to physical therapy if requested. She will follow-up with [place] in 4 to 6 weeks if her symptoms persist. If her symptoms otherwise improved she can follow-up on an as-needed basis History of Present Illness: [name] is a 76 y.o. female  who presents with right hip pain for which Orthopaedic evaluation was requested.  Patient has a complicated orthopedic history. She has previously been seen at [facility] health as well as at [facility]. She has had an SI joint fusion on 1 side. She has bilateral had bilateral total hip arthroplasties. She comes in today complaining of pain near the hamstring insertion onto her pelvis.  IPAIN:  Are you having pain? Yes: NPRS scale: 0/10 Pain location: back of leg Pain description: sharp Aggravating factors: Using the muscle Relieving factors: crutches, rest  PRECAUTIONS: None  WEIGHT BEARING RESTRICTIONS: No  FALLS:  Has patient fallen in last 6 months? Yes. Number of falls 1  LIVING ENVIRONMENT: Lives with: lives with their family Lives in: House/apartment Stairs: Yes: External: 1 steps; none Has following equipment at home: Crutches and Wheelchair (manual)  OCCUPATION: N/A,   PLOF: Independent  PATIENT GOALS: To be able to walk without pain, not re-injure her tendon   OBJECTIVE:   DIAGNOSTIC FINDINGS: nitial impression: fall, hip pain   3:14 PM x-rays personally reviewed and interpreted, agree no fracture or hip dislocation. Per Patient, CT showed partial tear.   MUSCLE LENGTH: Hamstrings: Right 64 deg; Left 68 deg  POSTURE: rounded shoulders and weight shift left  PALPATION: Mildly TTP at hamstring origin both med and lateral, ITB on R  LOWER EXTREMITY ROM:  PROM generally WFL, limited due to multiple joint replacements.   LOWER EXTREMITY MMT: LLE strength 5/5,   MMT Right eval Left eval  Hip flexion 4   Hip extension 3-   Hip abduction    Hip adduction    Hip internal rotation    Hip external rotation    Knee flexion 3+   Knee extension 3+   Ankle dorsiflexion 4   Ankle plantarflexion    Ankle inversion    Ankle eversion     (Blank rows = not tested)  FUNCTIONAL TESTS:  5 times sit to stand: 9.58 Timed up and go (TUG): 26.24 2 minute walk test:  TBD  GAIT: Distance walked: 98' Assistive device utilized: Crutches Level of assistance: Modified independence Comments: Step through gait, partial weight through RLE, slow, cautious   TODAY'S TREATMENT:  DATE:  12/30/21 NuStep L5 x 6 min Resisted gait 30lb 4 way x 4 each 6in step ups x10 each Side step and tandem walking on balance bean in // bars S2S on airex 2x10 Hip Abd ball squeezes 2x10    12/26/21 NuStep L5 x 6 min  Forward step ups 6in x10 each Lateal step ups 6in x10 each S2S Airex 2x10 Ball squeezes 2x10 Hip abd green 2x10 Supine stretch to R hamstring with STM, very cautious  12/23/21 NuStep L4 x 6 minutes Supine stretch to R hamstring, active and passive, very cautious Supine bridge, clamshells and ball squeeze x 10 each STM to R prox hamstring tendon F/B gentle stretch Standing min squats x 10, VC to remain centered. Seated long kicks, slow, with 5 sec hold for HS stretch Knee flexion against G tband, 10 reps each.   12/19/21 NuStep L4 x 6 min  S2S 2x10 Light HS stretching Resisted gait 20lb 4 way x 3 each  4in step ups x10 each Leg Ext 5lb 2x10    12/13/21 Education, HEP    PATIENT EDUCATION:  Education details: POC, HEP Person educated: Patient Education method: Customer service manager Education comprehension: verbalized understanding and returned demonstration  HOME EXERCISE PROGRAM:  [id number]  ASSESSMENT:  CLINICAL IMPRESSION: Patient reports no new issues today,  gentle progressions to slowly increase stress to hamstring. Some tenderness reported with stretching and STM. Ptreports she couls feel it in her leg  with sit to stands. Some UE use needed with tandem walking and side steps  OBJECTIVE IMPAIRMENTS: Abnormal gait, decreased balance, decreased coordination, decreased endurance, difficulty walking,  decreased ROM, decreased strength, impaired flexibility, postural dysfunction, and pain.   ACTIVITY LIMITATIONS: carrying, lifting, bending, sitting, standing, squatting, stairs, and locomotion level  PARTICIPATION LIMITATIONS: meal prep, cleaning, laundry, driving, shopping, and yard work  PERSONAL FACTORS: Age and Past/current experiences are also affecting patient's functional outcome.   REHAB POTENTIAL: Good  CLINICAL DECISION MAKING: Evolving/moderate complexity  EVALUATION COMPLEXITY: Moderate   GOALS: Goals reviewed with patient? Yes  SHORT TERM GOALS: Target date: 01/10/2022  I with basic HEP Baseline: Goal status: ongoing  LONG TERM GOALS: Target date: 03/07/2022   I with final HEP Baseline:  Goal status: INITIAL  2.  Complete TUG in < 12 sec Baseline: 26.24 Goal status: INITIAL  3.  Functional gait will improve to using no AD, no abnormalities. Baseline: crutches with decreased WB through RLE Goal status: INITIAL  4.  Increase R hip strength to at least 4/5 Baseline: 3 Goal status: ongoing  5.  Patient will be able to complete all daily tasks without pain in R hip Baseline:  Goal status: INITIAL PLAN:  PT FREQUENCY: 2x/week  PT DURATION: 12 weeks  PLANNED INTERVENTIONS: Therapeutic exercises, Therapeutic activity, Neuromuscular re-education, Balance training, Gait training, Patient/Family education, Self Care, Joint mobilization, Stair training, Dry Needling, Cryotherapy, Moist heat, Ionotophoresis '4mg'$ /ml Dexamethasone, and Manual therapy  PLAN FOR NEXT SESSION: Assess tolerance to Hep and update as appropriate.  Cheri Fowler, PTA  12/30/21 10:16 AM  12/30/2021, 10:16 AM

## 2022-01-02 ENCOUNTER — Ambulatory Visit: Payer: Medicare Other | Admitting: Physical Therapy

## 2022-01-02 DIAGNOSIS — R2681 Unsteadiness on feet: Secondary | ICD-10-CM

## 2022-01-02 DIAGNOSIS — M25551 Pain in right hip: Secondary | ICD-10-CM

## 2022-01-02 DIAGNOSIS — R278 Other lack of coordination: Secondary | ICD-10-CM

## 2022-01-02 DIAGNOSIS — R262 Difficulty in walking, not elsewhere classified: Secondary | ICD-10-CM | POA: Diagnosis not present

## 2022-01-02 DIAGNOSIS — M6281 Muscle weakness (generalized): Secondary | ICD-10-CM

## 2022-01-02 NOTE — Therapy (Signed)
OUTPATIENT PHYSICAL THERAPY LOWER EXTREMITY EVALUATION   Patient Name: [name] MRN: [id number] DOB:[birth year], 76 y.o., female Today's Date: 01/02/2022   PT End of Session - 01/02/22 1155     Visit Number 6    Date for PT Re-Evaluation 03/07/22    PT Start Time 1148    PT Stop Time 1222    PT Time Calculation (min) 34 min    Activity Tolerance Patient tolerated treatment well    Behavior During Therapy [facility] for tasks assessed/performed              Past Medical History:  Diagnosis Date   Allergic rhinitis    Anxiety    Chronic venous insufficiency    Costochondritis 04/13/2017   DDD (degenerative disc disease), cervical    DDD (degenerative disc disease), lumbar    History of colon polyps    Hyperlipidemia    Hypertension    Hypothyroidism    Insomnia    Liver cyst    Liver nodule    OA (osteoarthritis)    Hip and knee   Obese    Pneumonia    hx   Restless legs syndrome (RLS)    Rotator cuff tear arthropathy, right    Squamous cell carcinoma of left thigh    Urge incontinence of urine    Past Surgical History:  Procedure Laterality Date   APPENDECTOMY     BLADDER SUSPENSION  2011   BREAST CYST EXCISION Left    over 50 years ago   COLONOSCOPY     JOINT REPLACEMENT     left hip replacment  revision 10 yrs ago, replacement done 10 yrs prior to revision   02   REVERSE SHOULDER ARTHROPLASTY Right 05/14/2017   Procedure: RIGHT REVERSE SHOULDER ARTHROPLASTY;  Surgeon: Justice Britain, MD;  Location: [place];  Service: Orthopedics;  Laterality: Right;   REVERSE SHOULDER ARTHROPLASTY Left 07/30/2017   Procedure: REVERSE SHOULDER ARTHROPLASTY;  Surgeon: Justice Britain, MD;  Location: [place];  Service: Orthopedics;  Laterality: Left;   right knee replacment  08   SACROILIAC JOINT FUSION Right 12/27/2014   Procedure: RIGHT SACROILIAC JOINT FUSION;  Surgeon: Melina Schools, MD;  Location: [place];  Service: Orthopedics;  Laterality: Right;  right sacral fusion    TONSILLECTOMY     TOTAL HIP ARTHROPLASTY  09/08/2011   Procedure: TOTAL HIP ARTHROPLASTY;  Surgeon: Gearlean Alf, MD;  Location: [facility];  Service: Orthopedics;  Laterality: Right;   TOTAL KNEE ARTHROPLASTY Left 02/26/2018   Procedure: LEFT TOTAL KNEE ARTHROPLASTY;  Surgeon: Mcarthur Rossetti, MD;  Location: [facility];  Service: Orthopedics;  Laterality: Left;   WISDOM TOOTH EXTRACTION     Patient Active Problem List   Diagnosis Date Noted   Status post total left knee replacement 02/26/2018   Unilateral primary osteoarthritis, left knee 02/01/2018   S/p reverse total shoulder arthroplasty 05/14/2017   SI (sacroiliac) pain 12/27/2014   Postop Hyponatremia 09/10/2011   OA (osteoarthritis) of hip 09/08/2011    PCP: Kristopher Glee  REFERRING PROVIDER: Gareth Morgan, MD   REFERRING DIAG: Diagnosis [phone] (ICD-10-CM) - Strain of muscle, fascia and tendon of the posterior muscle group at thigh level, right thigh, initial encounter   THERAPY DIAG:  Difficulty in walking, not elsewhere classified  Other lack of coordination  Muscle weakness (generalized)  Unsteadiness on feet  Pain in right hip  Rationale for Evaluation and Treatment: Rehabilitation  ONSET DATE: 12/02/2021   SUBJECTIVE:  SUBJECTIVE STATEMENT: Patient reports no issues  PERTINENT HISTORY: 1. Hamstring tendon rupture, right, initial encounter   Plan:   1. Clinically the patient's physical exam and history today are most suggestive of a hamstring insertional injury. She appears to have intact strength and does not appear to have significant limitation associated with this. Given the patient's age and other medical issues initial treatment would be nonoperative. We have suggested the patient begin exercise we will refer to physical therapy if requested. She will follow-up with [place] in 4 to 6 weeks if her symptoms persist. If her symptoms otherwise improved she can follow-up on an as-needed basis  History of Present Illness: [name] is a 76 y.o. female who presents with right hip pain for which Orthopaedic evaluation was requested.  Patient has a complicated orthopedic history. She has previously been seen at [facility] as well as at [facility]. She has had an SI joint fusion on 1 side. She has bilateral had bilateral total hip arthroplasties. She comes in today complaining of pain near the hamstring insertion onto her pelvis.  IPAIN:  Are you having pain? Yes: NPRS scale: 0/10 Pain location: back of leg Pain description: sharp Aggravating factors: Using the muscle Relieving factors: crutches, rest  PRECAUTIONS: None  WEIGHT BEARING RESTRICTIONS: No  FALLS:  Has patient fallen in last 6 months? Yes. Number of falls 1  LIVING ENVIRONMENT: Lives with: lives with their family Lives in: House/apartment Stairs: Yes: External: 1 steps; none Has following equipment at home: Crutches and Wheelchair (manual)  OCCUPATION: N/A,   PLOF: Independent  PATIENT GOALS: To be able to walk without pain, not re-injure her tendon   OBJECTIVE:   DIAGNOSTIC FINDINGS: nitial impression: fall, hip pain   3:14 PM x-rays personally reviewed and interpreted, agree no fracture or hip dislocation. Per Patient, CT showed partial tear.   MUSCLE LENGTH: Hamstrings: Right 64 deg; Left 68 deg  POSTURE: rounded shoulders and weight shift left  PALPATION: Mildly TTP at hamstring origin both med and lateral, ITB on R  LOWER EXTREMITY ROM:  PROM generally WFL, limited due to multiple joint replacements.   LOWER EXTREMITY MMT: LLE strength 5/5,   MMT Right eval Left eval  Hip flexion 4   Hip extension 3-   Hip abduction    Hip adduction    Hip internal rotation    Hip external rotation    Knee flexion 3+   Knee extension 3+   Ankle dorsiflexion 4   Ankle plantarflexion    Ankle inversion    Ankle eversion     (Blank rows = not tested)  FUNCTIONAL TESTS:  5 times sit to  stand: 9.58 Timed up and go (TUG): 26.24 2 minute walk test: TBD  GAIT: Distance walked: 75' Assistive device utilized: Crutches Level of assistance: Modified independence Comments: Step through gait, partial weight through RLE, slow, cautious   TODAY'S TREATMENT:  DATE:  01/02/22 NuStep L5 x 6 minutes   12/30/21 NuStep L5 x 6 min Resisted gait 30lb 4 way x 4 each 6in step ups x10 each Side step and tandem walking on balance bean in // bars S2S on airex 2x10 Hip Abd ball squeezes 2x10    12/26/21 NuStep L5 x 6 min  Forward step ups 6in x10 each Lateal step ups 6in x10 each S2S Airex 2x10 Ball squeezes 2x10 Hip abd green 2x10 Supine stretch to R hamstring with STM, very cautious  12/23/21 NuStep L4 x 6 minutes Supine stretch to R hamstring, active and passive, very cautious Supine bridge, clamshells and ball squeeze x 10 each STM to R prox hamstring tendon F/B gentle stretch Standing min squats x 10, VC to remain centered. Seated long kicks, slow, with 5 sec hold for HS stretch Knee flexion against G tband, 10 reps each.   12/19/21 NuStep L4 x 6 min  S2S 2x10 Light HS stretching Resisted gait 20lb 4 way x 3 each  4in step ups x10 each Leg Ext 5lb 2x10    12/13/21 Education, HEP    PATIENT EDUCATION:  Education details: POC, HEP Person educated: Patient Education method: Customer service manager Education comprehension: verbalized understanding and returned demonstration  HOME EXERCISE PROGRAM:  [id number]  ASSESSMENT:  CLINICAL IMPRESSION: Patient reports no new issues today,  gentle progressions to slowly increase stress to hamstring. Some tenderness reported with stretching and STM. Ptreports she couls feel it in her leg  with sit to stands. Some UE use needed with tandem walking and side steps  OBJECTIVE IMPAIRMENTS:  Abnormal gait, decreased balance, decreased coordination, decreased endurance, difficulty walking, decreased ROM, decreased strength, impaired flexibility, postural dysfunction, and pain.   ACTIVITY LIMITATIONS: carrying, lifting, bending, sitting, standing, squatting, stairs, and locomotion level  PARTICIPATION LIMITATIONS: meal prep, cleaning, laundry, driving, shopping, and yard work  PERSONAL FACTORS: Age and Past/current experiences are also affecting patient's functional outcome.   REHAB POTENTIAL: Good  CLINICAL DECISION MAKING: Evolving/moderate complexity  EVALUATION COMPLEXITY: Moderate   GOALS: Goals reviewed with patient? Yes  SHORT TERM GOALS: Target date: 01/10/2022  I with basic HEP Baseline: Goal status: ongoing  LONG TERM GOALS: Target date: 03/07/2022   I with final HEP Baseline:  Goal status: INITIAL  2.  Complete TUG in < 12 sec Baseline: 26.24 Goal status: INITIAL  3.  Functional gait will improve to using no AD, no abnormalities. Baseline: crutches with decreased WB through RLE Goal status: INITIAL  4.  Increase R hip strength to at least 4/5 Baseline: 3 Goal status: ongoing  5.  Patient will be able to complete all daily tasks without pain in R hip Baseline:  Goal status: INITIAL PLAN:  PT FREQUENCY: 2x/week  PT DURATION: 12 weeks  PLANNED INTERVENTIONS: Therapeutic exercises, Therapeutic activity, Neuromuscular re-education, Balance training, Gait training, Patient/Family education, Self Care, Joint mobilization, Stair training, Dry Needling, Cryotherapy, Moist heat, Ionotophoresis '4mg'$ /ml Dexamethasone, and Manual therapy  PLAN FOR NEXT SESSION: Assess tolerance to Hep and update as appropriate.  Cheri Fowler, PTA  01/02/22 12:30 PM  01/02/2022, 12:30 PM

## 2022-01-06 ENCOUNTER — Ambulatory Visit: Payer: Medicare Other | Admitting: Physical Therapy

## 2022-01-06 ENCOUNTER — Encounter: Payer: Self-pay | Admitting: Physical Therapy

## 2022-01-06 DIAGNOSIS — M25551 Pain in right hip: Secondary | ICD-10-CM

## 2022-01-06 DIAGNOSIS — R262 Difficulty in walking, not elsewhere classified: Secondary | ICD-10-CM | POA: Diagnosis not present

## 2022-01-06 DIAGNOSIS — M6281 Muscle weakness (generalized): Secondary | ICD-10-CM

## 2022-01-06 DIAGNOSIS — R278 Other lack of coordination: Secondary | ICD-10-CM

## 2022-01-06 NOTE — Therapy (Signed)
OUTPATIENT PHYSICAL THERAPY LOWER EXTREMITY EVALUATION   Patient Name: [name] MRN: [id number] DOB:05/01/1945, 76 y.o., female Today's Date: 01/06/2022   PT End of Session - 01/06/22 1148     Date for PT Re-Evaluation 03/07/22    PT Start Time 1148    PT Stop Time 53    PT Time Calculation (min) 42 min    Activity Tolerance Patient tolerated treatment well    Behavior During Therapy [facility] for tasks assessed/performed              Past Medical History:  Diagnosis Date   Allergic rhinitis    Anxiety    Chronic venous insufficiency    Costochondritis 04/13/2017   DDD (degenerative disc disease), cervical    DDD (degenerative disc disease), lumbar    History of colon polyps    Hyperlipidemia    Hypertension    Hypothyroidism    Insomnia    Liver cyst    Liver nodule    OA (osteoarthritis)    Hip and knee   Obese    Pneumonia    hx   Restless legs syndrome (RLS)    Rotator cuff tear arthropathy, right    Squamous cell carcinoma of left thigh    Urge incontinence of urine    Past Surgical History:  Procedure Laterality Date   APPENDECTOMY     BLADDER SUSPENSION  2011   BREAST CYST EXCISION Left    over 50 years ago   COLONOSCOPY     JOINT REPLACEMENT     left hip replacment  revision 10 yrs ago, replacement done 10 yrs prior to revision   02   REVERSE SHOULDER ARTHROPLASTY Right 05/14/2017   Procedure: RIGHT REVERSE SHOULDER ARTHROPLASTY;  Surgeon: Justice Britain, MD;  Location: [place];  Service: Orthopedics;  Laterality: Right;   REVERSE SHOULDER ARTHROPLASTY Left 07/30/2017   Procedure: REVERSE SHOULDER ARTHROPLASTY;  Surgeon: Justice Britain, MD;  Location: [place];  Service: Orthopedics;  Laterality: Left;   right knee replacment  08   SACROILIAC JOINT FUSION Right 12/27/2014   Procedure: RIGHT SACROILIAC JOINT FUSION;  Surgeon: Melina Schools, MD;  Location: [place];  Service: Orthopedics;  Laterality: Right;  right sacral fusion   TONSILLECTOMY     TOTAL  HIP ARTHROPLASTY  09/08/2011   Procedure: TOTAL HIP ARTHROPLASTY;  Surgeon: Gearlean Alf, MD;  Location: [facility];  Service: Orthopedics;  Laterality: Right;   TOTAL KNEE ARTHROPLASTY Left 02/26/2018   Procedure: LEFT TOTAL KNEE ARTHROPLASTY;  Surgeon: Mcarthur Rossetti, MD;  Location: [facility];  Service: Orthopedics;  Laterality: Left;   WISDOM TOOTH EXTRACTION     Patient Active Problem List   Diagnosis Date Noted   Status post total left knee replacement 02/26/2018   Unilateral primary osteoarthritis, left knee 02/01/2018   S/p reverse total shoulder arthroplasty 05/14/2017   SI (sacroiliac) pain 12/27/2014   Postop Hyponatremia 09/10/2011   OA (osteoarthritis) of hip 09/08/2011    PCP: Kristopher Glee  REFERRING PROVIDER: Gareth Morgan, MD   REFERRING DIAG: Diagnosis [phone] (ICD-10-CM) - Strain of muscle, fascia and tendon of the posterior muscle group at thigh level, right thigh, initial encounter   THERAPY DIAG:  Difficulty in walking, not elsewhere classified  Muscle weakness (generalized)  Other lack of coordination  Pain in right hip  Rationale for Evaluation and Treatment: Rehabilitation  ONSET DATE: 12/02/2021   SUBJECTIVE:   SUBJECTIVE STATEMENT: "I am wonderful"  PERTINENT HISTORY: 1. Hamstring  tendon rupture, right, initial encounter   Plan:   1. Clinically the patient's physical exam and history today are most suggestive of a hamstring insertional injury. She appears to have intact strength and does not appear to have significant limitation associated with this. Given the patient's age and other medical issues initial treatment would be nonoperative. We have suggested the patient begin exercise we will refer to physical therapy if requested. She will follow-up with [place] in 4 to 6 weeks if her symptoms persist. If her symptoms otherwise improved she can follow-up on an as-needed basis History of Present Illness: [name] is a 76 y.o.  female who presents with right hip pain for which Orthopaedic evaluation was requested.  Patient has a complicated orthopedic history. She has previously been seen at [facility] health as well as at [facility]. She has had an SI joint fusion on 1 side. She has bilateral had bilateral total hip arthroplasties. She comes in today complaining of pain near the hamstring insertion onto her pelvis.  IPAIN:  Are you having pain? Yes: NPRS scale: 0/10 Pain location: back of leg Pain description: sharp Aggravating factors: Using the muscle Relieving factors: crutches, rest  PRECAUTIONS: None  WEIGHT BEARING RESTRICTIONS: No  FALLS:  Has patient fallen in last 6 months? Yes. Number of falls 1  LIVING ENVIRONMENT: Lives with: lives with their family Lives in: House/apartment Stairs: Yes: External: 1 steps; none Has following equipment at home: Crutches and Wheelchair (manual)  OCCUPATION: N/A,   PLOF: Independent  PATIENT GOALS: To be able to walk without pain, not re-injure her tendon   OBJECTIVE:   DIAGNOSTIC FINDINGS: nitial impression: fall, hip pain   3:14 PM x-rays personally reviewed and interpreted, agree no fracture or hip dislocation. Per Patient, CT showed partial tear.   MUSCLE LENGTH: Hamstrings: Right 64 deg; Left 68 deg  POSTURE: rounded shoulders and weight shift left  PALPATION: Mildly TTP at hamstring origin both med and lateral, ITB on R  LOWER EXTREMITY ROM:  PROM generally WFL, limited due to multiple joint replacements.   LOWER EXTREMITY MMT: LLE strength 5/5,   MMT Right eval Left eval  Hip flexion 4   Hip extension 3-   Hip abduction    Hip adduction    Hip internal rotation    Hip external rotation    Knee flexion 3+   Knee extension 3+   Ankle dorsiflexion 4   Ankle plantarflexion    Ankle inversion    Ankle eversion     (Blank rows = not tested)  FUNCTIONAL TESTS:  5 times sit to stand: 9.58 Timed up and go (TUG): 26.24 2 minute walk  test: TBD  GAIT: Distance walked: 24' Assistive device utilized: Crutches Level of assistance: Modified independence Comments: Step through gait, partial weight through RLE, slow, cautious   TODAY'S TREATMENT:  DATE:  01/06/22 Bike L2.5 x1 min some hip discomfort S2S holding 4lb dumbbell 2x10 NuStep L5 x 5 min Resisted side steps over WaTE bar x 5  Side step and tandem walking on balance bean in // bars Hip Abd ball squeezes 2x10 Supine stretch to R hamstring with STM  01/02/22 NuStep L5 x 6 minutes   12/30/21 NuStep L5 x 6 min Resisted gait 30lb 4 way x 4 each 6in step ups x10 each Side step and tandem walking on balance bean in // bars S2S on airex 2x10 Hip Abd ball squeezes 2x10   12/26/21 NuStep L5 x 6 min  Forward step ups 6in x10 each Lateal step ups 6in x10 each S2S Airex 2x10 Ball squeezes 2x10 Hip abd green 2x10 Supine stretch to R hamstring with STM, very cautious  12/23/21 NuStep L4 x 6 minutes Supine stretch to R hamstring, active and passive, very cautious Supine bridge, clamshells and ball squeeze x 10 each STM to R prox hamstring tendon F/B gentle stretch Standing min squats x 10, VC to remain centered. Seated long kicks, slow, with 5 sec hold for HS stretch Knee flexion against G tband, 10 reps each.   12/19/21 NuStep L4 x 6 min  S2S 2x10 Light HS stretching Resisted gait 20lb 4 way x 3 each  4in step ups x10 each Leg Ext 5lb 2x10    12/13/21 Education, HEP    PATIENT EDUCATION:  Education details: POC, HEP Person educated: Patient Education method: Customer service manager Education comprehension: verbalized understanding and returned demonstration  HOME EXERCISE PROGRAM:  [id number]  ASSESSMENT:  CLINICAL IMPRESSION: Patient reports no new issues today. Continues with a progression of compound  strengthening of the LE. Some tenderness reported with stretching and STM, but this has improved. Some UE use needed with tandem walking and side steps  OBJECTIVE IMPAIRMENTS: Abnormal gait, decreased balance, decreased coordination, decreased endurance, difficulty walking, decreased ROM, decreased strength, impaired flexibility, postural dysfunction, and pain.   ACTIVITY LIMITATIONS: carrying, lifting, bending, sitting, standing, squatting, stairs, and locomotion level  PARTICIPATION LIMITATIONS: meal prep, cleaning, laundry, driving, shopping, and yard work  PERSONAL FACTORS: Age and Past/current experiences are also affecting patient's functional outcome.   REHAB POTENTIAL: Good  CLINICAL DECISION MAKING: Evolving/moderate complexity  EVALUATION COMPLEXITY: Moderate   GOALS: Goals reviewed with patient? Yes  SHORT TERM GOALS: Target date: 01/10/2022  I with basic HEP Baseline: Goal status: ongoing  LONG TERM GOALS: Target date: 03/07/2022   I with final HEP Baseline:  Goal status: INITIAL  2.  Complete TUG in < 12 sec Baseline: 26.24 Goal status: INITIAL  3.  Functional gait will improve to using no AD, no abnormalities. Baseline: crutches with decreased WB through RLE Goal status: INITIAL  4.  Increase R hip strength to at least 4/5 Baseline: 3 Goal status: ongoing  5.  Patient will be able to complete all daily tasks without pain in R hip Baseline:  Goal status: INITIAL PLAN:  PT FREQUENCY: 2x/week  PT DURATION: 12 weeks  PLANNED INTERVENTIONS: Therapeutic exercises, Therapeutic activity, Neuromuscular re-education, Balance training, Gait training, Patient/Family education, Self Care, Joint mobilization, Stair training, Dry Needling, Cryotherapy, Moist heat, Ionotophoresis '4mg'$ /ml Dexamethasone, and Manual therapy  PLAN FOR NEXT SESSION: Assess tolerance to Hep and update as appropriate.  Cheri Fowler, PTA  01/06/22 11:52 AM  01/06/2022, 11:52 AM

## 2022-01-08 ENCOUNTER — Ambulatory Visit: Payer: Medicare Other | Admitting: Physical Therapy

## 2022-01-08 ENCOUNTER — Encounter: Payer: Self-pay | Admitting: Physical Therapy

## 2022-01-08 DIAGNOSIS — R262 Difficulty in walking, not elsewhere classified: Secondary | ICD-10-CM

## 2022-01-08 DIAGNOSIS — M6281 Muscle weakness (generalized): Secondary | ICD-10-CM

## 2022-01-08 NOTE — Therapy (Signed)
OUTPATIENT PHYSICAL THERAPY LOWER EXTREMITY EVALUATION   Patient Name: [name] MRN: [id number] DOB:1945/07/29, 76 y.o., female Today's Date: 01/08/2022   PT End of Session - 01/08/22 1147     Visit Number 7    Date for PT Re-Evaluation 03/07/22    PT Start Time 1145    PT Stop Time 1230    PT Time Calculation (min) 45 min    Activity Tolerance Patient tolerated treatment well    Behavior During Therapy [facility] for tasks assessed/performed              Past Medical History:  Diagnosis Date   Allergic rhinitis    Anxiety    Chronic venous insufficiency    Costochondritis 04/13/2017   DDD (degenerative disc disease), cervical    DDD (degenerative disc disease), lumbar    History of colon polyps    Hyperlipidemia    Hypertension    Hypothyroidism    Insomnia    Liver cyst    Liver nodule    OA (osteoarthritis)    Hip and knee   Obese    Pneumonia    hx   Restless legs syndrome (RLS)    Rotator cuff tear arthropathy, right    Squamous cell carcinoma of left thigh    Urge incontinence of urine    Past Surgical History:  Procedure Laterality Date   APPENDECTOMY     BLADDER SUSPENSION  2011   BREAST CYST EXCISION Left    over 50 years ago   COLONOSCOPY     JOINT REPLACEMENT     left hip replacment  revision 10 yrs ago, replacement done 10 yrs prior to revision   02   REVERSE SHOULDER ARTHROPLASTY Right 05/14/2017   Procedure: RIGHT REVERSE SHOULDER ARTHROPLASTY;  Surgeon: Justice Britain, MD;  Location: [place];  Service: Orthopedics;  Laterality: Right;   REVERSE SHOULDER ARTHROPLASTY Left 07/30/2017   Procedure: REVERSE SHOULDER ARTHROPLASTY;  Surgeon: Justice Britain, MD;  Location: [place];  Service: Orthopedics;  Laterality: Left;   right knee replacment  08   SACROILIAC JOINT FUSION Right 12/27/2014   Procedure: RIGHT SACROILIAC JOINT FUSION;  Surgeon: Melina Schools, MD;  Location: [place];  Service: Orthopedics;  Laterality: Right;  right sacral fusion    TONSILLECTOMY     TOTAL HIP ARTHROPLASTY  09/08/2011   Procedure: TOTAL HIP ARTHROPLASTY;  Surgeon: Gearlean Alf, MD;  Location: [facility];  Service: Orthopedics;  Laterality: Right;   TOTAL KNEE ARTHROPLASTY Left 02/26/2018   Procedure: LEFT TOTAL KNEE ARTHROPLASTY;  Surgeon: Mcarthur Rossetti, MD;  Location: [facility];  Service: Orthopedics;  Laterality: Left;   WISDOM TOOTH EXTRACTION     Patient Active Problem List   Diagnosis Date Noted   Status post total left knee replacement 02/26/2018   Unilateral primary osteoarthritis, left knee 02/01/2018   S/p reverse total shoulder arthroplasty 05/14/2017   SI (sacroiliac) pain 12/27/2014   Postop Hyponatremia 09/10/2011   OA (osteoarthritis) of hip 09/08/2011    PCP: Kristopher Glee  REFERRING PROVIDER: Gareth Morgan, MD   REFERRING DIAG: Diagnosis [phone] (ICD-10-CM) - Strain of muscle, fascia and tendon of the posterior muscle group at thigh level, right thigh, initial encounter   THERAPY DIAG:  Difficulty in walking, not elsewhere classified  Muscle weakness (generalized)  Rationale for Evaluation and Treatment: Rehabilitation  ONSET DATE: 12/02/2021   SUBJECTIVE:   SUBJECTIVE STATEMENT: Having a little pain in the glue HS tie in area  PERTINENT HISTORY: 1. Hamstring tendon rupture, right, initial encounter   Plan:   1. Clinically the patient's physical exam and history today are most suggestive of a hamstring insertional injury. She appears to have intact strength and does not appear to have significant limitation associated with this. Given the patient's age and other medical issues initial treatment would be nonoperative. We have suggested the patient begin exercise we will refer to physical therapy if requested. She will follow-up with [place] in 4 to 6 weeks if her symptoms persist. If her symptoms otherwise improved she can follow-up on an as-needed basis History of Present Illness: [name] is a 76 y.o.  female who presents with right hip pain for which Orthopaedic evaluation was requested.  Patient has a complicated orthopedic history. She has previously been seen at [facility] health as well as at [facility]. She has had an SI joint fusion on 1 side. She has bilateral had bilateral total hip arthroplasties. She comes in today complaining of pain near the hamstring insertion onto her pelvis.  IPAIN:  Are you having pain? Yes: NPRS scale: 02/10 Pain location: back of leg Pain description: sharp Aggravating factors: Using the muscle Relieving factors: crutches, rest  PRECAUTIONS: None  WEIGHT BEARING RESTRICTIONS: No  FALLS:  Has patient fallen in last 6 months? Yes. Number of falls 1  LIVING ENVIRONMENT: Lives with: lives with their family Lives in: House/apartment Stairs: Yes: External: 1 steps; none Has following equipment at home: Crutches and Wheelchair (manual)  OCCUPATION: N/A,   PLOF: Independent  PATIENT GOALS: To be able to walk without pain, not re-injure her tendon   OBJECTIVE:   DIAGNOSTIC FINDINGS: nitial impression: fall, hip pain   3:14 PM x-rays personally reviewed and interpreted, agree no fracture or hip dislocation. Per Patient, CT showed partial tear.   MUSCLE LENGTH: Hamstrings: Right 64 deg; Left 68 deg  POSTURE: rounded shoulders and weight shift left  PALPATION: Mildly TTP at hamstring origin both med and lateral, ITB on R  LOWER EXTREMITY ROM:  PROM generally WFL, limited due to multiple joint replacements.   LOWER EXTREMITY MMT: LLE strength 5/5,   MMT Right eval Left eval  Hip flexion 4   Hip extension 3-   Hip abduction    Hip adduction    Hip internal rotation    Hip external rotation    Knee flexion 3+   Knee extension 3+   Ankle dorsiflexion 4   Ankle plantarflexion    Ankle inversion    Ankle eversion     (Blank rows = not tested)  FUNCTIONAL TESTS:  5 times sit to stand: 9.58 Timed up and go (TUG): 26.24 2 minute walk  test: TBD  GAIT: Distance walked: 69' Assistive device utilized: Crutches Level of assistance: Modified independence Comments: Step through gait, partial weight through RLE, slow, cautious   TODAY'S TREATMENT:  DATE:  01/08/22 NuStep L5 x 6 min Gait around back building in parking lot, Decrease L knee TKE when ambulating. Fatigue present with up hill ambulation  Side step and tandem walking on balance bean in // bars Step ups 4in box on airex x 10 Heel raises black bar 2x12 S2S LE on airex OHP 2x10 Supine stretch to R hamstring with STM  01/06/22 Bike L2.5 x1 min some hip discomfort S2S holding 4lb dumbbell 2x10 NuStep L5 x 5 min Resisted side steps over WaTE bar x 5  Side step and tandem walking on balance bean in // bars Hip Abd ball squeezes 2x10 Supine stretch to R hamstring with STM  01/02/22 NuStep L5 x 6 minutes   12/30/21 NuStep L5 x 6 min Resisted gait 30lb 4 way x 4 each 6in step ups x10 each Side step and tandem walking on balance bean in // bars S2S on airex 2x10 Hip Abd ball squeezes 2x10   12/26/21 NuStep L5 x 6 min  Forward step ups 6in x10 each Lateal step ups 6in x10 each S2S Airex 2x10 Ball squeezes 2x10 Hip abd green 2x10 Supine stretch to R hamstring with STM, very cautious  12/23/21 NuStep L4 x 6 minutes Supine stretch to R hamstring, active and passive, very cautious Supine bridge, clamshells and ball squeeze x 10 each STM to R prox hamstring tendon F/B gentle stretch Standing min squats x 10, VC to remain centered. Seated long kicks, slow, with 5 sec hold for HS stretch Knee flexion against G tband, 10 reps each.   12/19/21 NuStep L4 x 6 min  S2S 2x10 Light HS stretching Resisted gait 20lb 4 way x 3 each  4in step ups x10 each Leg Ext 5lb 2x10    12/13/21 Education, HEP    PATIENT EDUCATION:   Education details: POC, HEP Person educated: Patient Education method: Customer service manager Education comprehension: verbalized understanding and returned demonstration  HOME EXERCISE PROGRAM:  [id number]  ASSESSMENT:  CLINICAL IMPRESSION: Patient reports no new issues today. Continues with a progression of compound strengthening of the LE. Pt has progressed towards LTG's. Some tenderness reported with stretching and STM, but this has improved. Added outdoor ambulation fatigue and decrease cadence with uphill ambulation. Some tenderness in the lateral HS with STM   OBJECTIVE IMPAIRMENTS: Abnormal gait, decreased balance, decreased coordination, decreased endurance, difficulty walking, decreased ROM, decreased strength, impaired flexibility, postural dysfunction, and pain.   ACTIVITY LIMITATIONS: carrying, lifting, bending, sitting, standing, squatting, stairs, and locomotion level  PARTICIPATION LIMITATIONS: meal prep, cleaning, laundry, driving, shopping, and yard work  PERSONAL FACTORS: Age and Past/current experiences are also affecting patient's functional outcome.   REHAB POTENTIAL: Good  CLINICAL DECISION MAKING: Evolving/moderate complexity  EVALUATION COMPLEXITY: Moderate   GOALS: Goals reviewed with patient? Yes  SHORT TERM GOALS: Target date: 01/10/2022  I with basic HEP Baseline: Goal status: ongoing  LONG TERM GOALS: Target date: 03/07/2022   I with final HEP Baseline:  Goal status: INITIAL  2.  Complete TUG in < 12 sec Baseline: 26.24 Goal status: Met 10.02 01/08/22  3.  Functional gait will improve to using no AD, no abnormalities. Baseline: crutches with decreased WB through RLE Goal status: Met 01/08/22  4.  Increase R hip strength to at least 4/5 Baseline: 3 Goal status: ongoing  5.  Patient will be able to complete all daily tasks without pain in R hip Baseline:  Goal status: INITIAL PLAN:  PT FREQUENCY: 2x/week  PT DURATION: 12  weeks  PLANNED INTERVENTIONS: Therapeutic exercises, Therapeutic activity, Neuromuscular re-education, Balance training, Gait training, Patient/Family education, Self Care, Joint mobilization, Stair training, Dry Needling, Cryotherapy, Moist heat, Ionotophoresis 49m/ml Dexamethasone, and Manual therapy  PLAN FOR NEXT SESSION: Assess tolerance to Hep and update as appropriate.  RCheri Fowler PTA  01/08/22 11:49 AM  01/08/2022, 11:49 AM

## 2022-01-13 ENCOUNTER — Ambulatory Visit: Payer: Medicare Other | Admitting: Physical Therapy

## 2022-01-13 ENCOUNTER — Encounter: Payer: Self-pay | Admitting: Physical Therapy

## 2022-01-13 DIAGNOSIS — R262 Difficulty in walking, not elsewhere classified: Secondary | ICD-10-CM

## 2022-01-13 DIAGNOSIS — M6281 Muscle weakness (generalized): Secondary | ICD-10-CM

## 2022-01-13 DIAGNOSIS — R278 Other lack of coordination: Secondary | ICD-10-CM

## 2022-01-13 DIAGNOSIS — M25551 Pain in right hip: Secondary | ICD-10-CM

## 2022-01-13 NOTE — Therapy (Signed)
OUTPATIENT PHYSICAL THERAPY LOWER EXTREMITY TREATMENT   Patient Name: [name] MRN: [id number] DOB:[birth year], 76 y.o., female Today's Date: 01/13/2022   PT End of Session - 01/13/22 1150     Visit Number 8    Date for PT Re-Evaluation 03/07/22    PT Start Time 1148    PT Stop Time 1230    PT Time Calculation (min) 42 min    Activity Tolerance Patient tolerated treatment well    Behavior During Therapy [facility] for tasks assessed/performed              Past Medical History:  Diagnosis Date   Allergic rhinitis    Anxiety    Chronic venous insufficiency    Costochondritis 04/13/2017   DDD (degenerative disc disease), cervical    DDD (degenerative disc disease), lumbar    History of colon polyps    Hyperlipidemia    Hypertension    Hypothyroidism    Insomnia    Liver cyst    Liver nodule    OA (osteoarthritis)    Hip and knee   Obese    Pneumonia    hx   Restless legs syndrome (RLS)    Rotator cuff tear arthropathy, right    Squamous cell carcinoma of left thigh    Urge incontinence of urine    Past Surgical History:  Procedure Laterality Date   APPENDECTOMY     BLADDER SUSPENSION  2011   BREAST CYST EXCISION Left    over 50 years ago   COLONOSCOPY     JOINT REPLACEMENT     left hip replacment  revision 10 yrs ago, replacement done 10 yrs prior to revision   02   REVERSE SHOULDER ARTHROPLASTY Right 05/14/2017   Procedure: RIGHT REVERSE SHOULDER ARTHROPLASTY;  Surgeon: Wendy Britain, MD;  Location: [place];  Service: Orthopedics;  Laterality: Right;   REVERSE SHOULDER ARTHROPLASTY Left 07/30/2017   Procedure: REVERSE SHOULDER ARTHROPLASTY;  Surgeon: Wendy Britain, MD;  Location: [place];  Service: Orthopedics;  Laterality: Left;   right knee replacment  08   SACROILIAC JOINT FUSION Right 12/27/2014   Procedure: RIGHT SACROILIAC JOINT FUSION;  Surgeon: Wendy Schools, MD;  Location: [place];  Service: Orthopedics;  Laterality: Right;  right sacral fusion    TONSILLECTOMY     TOTAL HIP ARTHROPLASTY  09/08/2011   Procedure: TOTAL HIP ARTHROPLASTY;  Surgeon: Wendy Alf, MD;  Location: [facility];  Service: Orthopedics;  Laterality: Right;   TOTAL KNEE ARTHROPLASTY Left 02/26/2018   Procedure: LEFT TOTAL KNEE ARTHROPLASTY;  Surgeon: Wendy Rossetti, MD;  Location: [facility];  Service: Orthopedics;  Laterality: Left;   WISDOM TOOTH EXTRACTION     Patient Active Problem List   Diagnosis Date Noted   Status post total left knee replacement 02/26/2018   Unilateral primary osteoarthritis, left knee 02/01/2018   S/p reverse total shoulder arthroplasty 05/14/2017   SI (sacroiliac) pain 12/27/2014   Postop Hyponatremia 09/10/2011   OA (osteoarthritis) of hip 09/08/2011    PCP: [name]  REFERRING PROVIDER: Gareth Morgan, MD   REFERRING DIAG: Diagnosis [phone] (ICD-10-CM) - Strain of muscle, fascia and tendon of the posterior muscle group at thigh level, right thigh, initial encounter   THERAPY DIAG:  Difficulty in walking, not elsewhere classified  Muscle weakness (generalized)  Pain in right hip  Other lack of coordination  Rationale for Evaluation and Treatment: Rehabilitation  ONSET DATE: 12/02/2021   SUBJECTIVE:   SUBJECTIVE STATEMENT: "I am  doing great" Was able to do some yardwork  PERTINENT HISTORY: 1. Hamstring tendon rupture, right, initial encounter   Plan:   1. Clinically the patient's physical exam and history today are most suggestive of a hamstring insertional injury. She appears to have intact strength and does not appear to have significant limitation associated with this. Given the patient's age and other medical issues initial treatment would be nonoperative. We have suggested the patient begin exercise we will refer to physical therapy if requested. She will follow-up with [place] in 4 to 6 weeks if her symptoms persist. If her symptoms otherwise improved she can follow-up on an as-needed basis  History of Present Illness: [name] is a 76 y.o. female who presents with right hip pain for which Orthopaedic evaluation was requested.  Patient has a complicated orthopedic history. She has previously been seen at [facility] health as well as at [facility]. She has had an SI joint fusion on 1 side. She has bilateral had bilateral total hip arthroplasties. She comes in today complaining of pain near the hamstring insertion onto her pelvis.  IPAIN:  Are you having pain? Yes: NPRS scale: 0/10 Pain location: back of leg Pain description: sharp Aggravating factors: Using the muscle Relieving factors: crutches, rest  PRECAUTIONS: None  WEIGHT BEARING RESTRICTIONS: No  FALLS:  Has patient fallen in last 6 months? Yes. Number of falls 1  LIVING ENVIRONMENT: Lives with: lives with their family Lives in: House/apartment Stairs: Yes: External: 1 steps; none Has following equipment at home: Crutches and Wheelchair (manual)  OCCUPATION: N/A,   PLOF: Independent  PATIENT GOALS: To be able to walk without pain, not re-injure her tendon   OBJECTIVE:   DIAGNOSTIC FINDINGS: nitial impression: fall, hip pain   3:14 PM x-rays personally reviewed and interpreted, agree no fracture or hip dislocation. Per Patient, CT showed partial tear.   MUSCLE LENGTH: Hamstrings: Right 64 deg; Left 68 deg  POSTURE: rounded shoulders and weight shift left  PALPATION: Mildly TTP at hamstring origin both med and lateral, ITB on R  LOWER EXTREMITY ROM:  PROM generally WFL, limited due to multiple joint replacements.   LOWER EXTREMITY MMT: LLE strength 5/5,   MMT Right eval Left eval  Hip flexion 4   Hip extension 3-   Hip abduction    Hip adduction    Hip internal rotation    Hip external rotation    Knee flexion 3+   Knee extension 3+   Ankle dorsiflexion 4   Ankle plantarflexion    Ankle inversion    Ankle eversion     (Blank rows = not tested)  FUNCTIONAL TESTS:  5 times sit to  stand: 9.58 Timed up and go (TUG): 26.24 2 minute walk test: TBD  GAIT: Distance walked: 78' Assistive device utilized: Crutches Level of assistance: Modified independence Comments: Step through gait, partial weight through RLE, slow, cautious   TODAY'S TREATMENT:  DATE:  01/13/22 Bike L3.5 x 6 min  Leg press 40lb 2x10 Forward & Lateral step ups 4in box on airex x10 each RLE LAQ 3lb 2x10 RLE HS curls yellow 2x15 S2S on airex holding yellow ball  Heel raises  Supine stretch to R hamstring with STM   01/08/22 NuStep L5 x 6 min Gait around back building in parking lot, Decrease L knee TKE when ambulating. Fatigue present with up hill ambulation  Side step and tandem walking on balance bean in // bars Step ups 4in box on airex x 10 Heel raises black bar 2x12 S2S LE on airex OHP 2x10 Supine stretch to R hamstring with STM  01/06/22 Bike L2.5 x1 min some hip discomfort S2S holding 4lb dumbbell 2x10 NuStep L5 x 5 min Resisted side steps over WaTE bar x 5  Side step and tandem walking on balance bean in // bars Hip Abd ball squeezes 2x10 Supine stretch to R hamstring with STM  01/02/22 NuStep L5 x 6 minutes   12/30/21 NuStep L5 x 6 min Resisted gait 30lb 4 way x 4 each 6in step ups x10 each Side step and tandem walking on balance bean in // bars S2S on airex 2x10 Hip Abd ball squeezes 2x10   12/26/21 NuStep L5 x 6 min  Forward step ups 6in x10 each Lateal step ups 6in x10 each S2S Airex 2x10 Ball squeezes 2x10 Hip abd green 2x10 Supine stretch to R hamstring with STM, very cautious  12/23/21 NuStep L4 x 6 minutes Supine stretch to R hamstring, active and passive, very cautious Supine bridge, clamshells and ball squeeze x 10 each STM to R prox hamstring tendon F/B gentle stretch Standing min squats x 10, VC to remain centered. Seated  long kicks, slow, with 5 sec hold for HS stretch Knee flexion against G tband, 10 reps each.   12/19/21 NuStep L4 x 6 min  S2S 2x10 Light HS stretching Resisted gait 20lb 4 way x 3 each  4in step ups x10 each Leg Ext 5lb 2x10    12/13/21 Education, HEP    PATIENT EDUCATION:  Education details: POC, HEP Person educated: Patient Education method: Customer service manager Education comprehension: verbalized understanding and returned demonstration  HOME EXERCISE PROGRAM:  [id number]  ASSESSMENT:  CLINICAL IMPRESSION: Patient enters doing well with no pain. She reports doing some yard work without issue. Less soreness noted overall today with STM. Pt abe to complete light resisted HS curls without symptoms.No issues with today's interventions. Pt is pleased with her progress over the last few days.   OBJECTIVE IMPAIRMENTS: Abnormal gait, decreased balance, decreased coordination, decreased endurance, difficulty walking, decreased ROM, decreased strength, impaired flexibility, postural dysfunction, and pain.   ACTIVITY LIMITATIONS: carrying, lifting, bending, sitting, standing, squatting, stairs, and locomotion level  PARTICIPATION LIMITATIONS: meal prep, cleaning, laundry, driving, shopping, and yard work  PERSONAL FACTORS: Age and Past/current experiences are also affecting patient's functional outcome.   REHAB POTENTIAL: Good  CLINICAL DECISION MAKING: Evolving/moderate complexity  EVALUATION COMPLEXITY: Moderate   GOALS: Goals reviewed with patient? Yes  SHORT TERM GOALS: Target date: 01/10/2022  I with basic HEP Baseline: Goal status: ongoing  LONG TERM GOALS: Target date: 03/07/2022   I with final HEP Baseline:  Goal status: INITIAL  2.  Complete TUG in < 12 sec Baseline: 26.24 Goal status: Met 10.02 01/08/22  3.  Functional gait will improve to using no AD, no abnormalities. Baseline: crutches with decreased WB through RLE Goal status: Met  01/08/22  4.  Increase R hip strength to at least 4/5 Baseline: 3 Goal status: ongoing  5.  Patient will be able to complete all daily tasks without pain in R hip Baseline:  Goal status: INITIAL PLAN:  PT FREQUENCY: 2x/week  PT DURATION: 12 weeks  PLANNED INTERVENTIONS: Therapeutic exercises, Therapeutic activity, Neuromuscular re-education, Balance training, Gait training, Patient/Family education, Self Care, Joint mobilization, Stair training, Dry Needling, Cryotherapy, Moist heat, Ionotophoresis 32m/ml Dexamethasone, and Manual therapy  PLAN FOR NEXT SESSION: Pt will be placed on a two week hold. Pt will call to schedule mote PT if she notes a decline in function.  RCheri Fowler PTA  01/13/22 11:50 AM  01/13/2022, 11:50 AM

## 2022-01-16 ENCOUNTER — Ambulatory Visit: Payer: Medicare Other | Admitting: Physical Therapy

## 2022-01-20 ENCOUNTER — Ambulatory Visit: Payer: Medicare Other | Admitting: Physical Therapy

## 2022-01-23 ENCOUNTER — Ambulatory Visit: Payer: Medicare Other | Admitting: Physical Therapy

## 2022-01-27 ENCOUNTER — Ambulatory Visit: Payer: Medicare Other | Admitting: Physical Therapy

## 2022-01-28 ENCOUNTER — Ambulatory Visit: Payer: Medicare Other | Admitting: Physical Therapy

## 2022-01-30 ENCOUNTER — Ambulatory Visit: Payer: Medicare Other | Admitting: Physical Therapy

## 2022-02-03 ENCOUNTER — Ambulatory Visit: Payer: Medicare Other | Admitting: Physical Therapy

## 2022-02-06 ENCOUNTER — Ambulatory Visit: Payer: Medicare Other | Admitting: Physical Therapy

## 2022-02-13 ENCOUNTER — Ambulatory Visit: Payer: Medicare Other | Admitting: Physical Therapy

## 2022-03-28 ENCOUNTER — Other Ambulatory Visit: Payer: Self-pay

## 2022-03-28 DIAGNOSIS — Z1231 Encounter for screening mammogram for malignant neoplasm of breast: Secondary | ICD-10-CM

## 2022-05-15 ENCOUNTER — Ambulatory Visit
Admission: RE | Admit: 2022-05-15 | Discharge: 2022-05-15 | Disposition: A | Discharge status: Home / Self Care | Payer: Medicare Other | Source: Ambulatory Visit | Attending: Internal Medicine | Admitting: Internal Medicine

## 2022-05-15 DIAGNOSIS — Z1231 Encounter for screening mammogram for malignant neoplasm of breast: Secondary | ICD-10-CM

## 2023-04-21 ENCOUNTER — Other Ambulatory Visit: Payer: Self-pay | Admitting: Internal Medicine

## 2023-04-21 DIAGNOSIS — Z1231 Encounter for screening mammogram for malignant neoplasm of breast: Secondary | ICD-10-CM

## 2023-05-18 ENCOUNTER — Ambulatory Visit
Admission: RE | Admit: 2023-05-18 | Discharge: 2023-05-18 | Disposition: A | Discharge status: Home / Self Care | Source: Ambulatory Visit | Attending: Internal Medicine | Admitting: Internal Medicine

## 2023-05-18 DIAGNOSIS — Z1231 Encounter for screening mammogram for malignant neoplasm of breast: Secondary | ICD-10-CM

## 2023-10-13 ENCOUNTER — Other Ambulatory Visit (HOSPITAL_BASED_OUTPATIENT_CLINIC_OR_DEPARTMENT_OTHER): Payer: Self-pay
